# Patient Record
Sex: Male | Born: 1935
Health system: Southern US, Community
[De-identification: ages and names within clinical notes are randomized; demographics above are authoritative.]

## PROBLEM LIST (undated history)

## (undated) DIAGNOSIS — M2550 Pain in unspecified joint: Secondary | ICD-10-CM

## (undated) DIAGNOSIS — N400 Enlarged prostate without lower urinary tract symptoms: Secondary | ICD-10-CM

## (undated) DIAGNOSIS — K219 Gastro-esophageal reflux disease without esophagitis: Secondary | ICD-10-CM

## (undated) DIAGNOSIS — E7800 Pure hypercholesterolemia, unspecified: Secondary | ICD-10-CM

## (undated) DIAGNOSIS — Z860100 Personal history of colon polyps, unspecified: Secondary | ICD-10-CM

## (undated) DIAGNOSIS — N289 Disorder of kidney and ureter, unspecified: Secondary | ICD-10-CM

## (undated) DIAGNOSIS — I10 Essential (primary) hypertension: Secondary | ICD-10-CM

## (undated) DIAGNOSIS — Z87438 Personal history of other diseases of male genital organs: Secondary | ICD-10-CM

## (undated) DIAGNOSIS — I872 Venous insufficiency (chronic) (peripheral): Secondary | ICD-10-CM

## (undated) DIAGNOSIS — R42 Dizziness and giddiness: Secondary | ICD-10-CM

## (undated) DIAGNOSIS — H5440 Blindness, one eye, unspecified eye: Secondary | ICD-10-CM

## (undated) DIAGNOSIS — M1990 Unspecified osteoarthritis, unspecified site: Secondary | ICD-10-CM

## (undated) DIAGNOSIS — R7303 Prediabetes: Secondary | ICD-10-CM

## (undated) DIAGNOSIS — R351 Nocturia: Secondary | ICD-10-CM

## (undated) DIAGNOSIS — F419 Anxiety disorder, unspecified: Secondary | ICD-10-CM

## (undated) HISTORY — DX: Personal history of other diseases of male genital organs: Z87.438

## (undated) HISTORY — DX: Anxiety disorder, unspecified: F41.9

## (undated) HISTORY — DX: Personal history of colonic polyps: Z86.010

## (undated) HISTORY — DX: Unspecified osteoarthritis, unspecified site: M19.90

## (undated) HISTORY — PX: COLONOSCOPY: SHX174

## (undated) HISTORY — DX: Pure hypercholesterolemia, unspecified: E78.00

## (undated) HISTORY — PX: ESOPHAGOGASTRODUODENOSCOPY: SHX1529

## (undated) HISTORY — DX: Dizziness and giddiness: R42

## (undated) HISTORY — DX: Personal history of colon polyps, unspecified: Z86.0100

## (undated) HISTORY — PX: JOINT REPLACEMENT: SHX530

## (undated) HISTORY — DX: Disorder of kidney and ureter, unspecified: N28.9

## (undated) HISTORY — DX: Venous insufficiency (chronic) (peripheral): I87.2

## (undated) HISTORY — DX: Gastro-esophageal reflux disease without esophagitis: K21.9

## (undated) HISTORY — DX: Essential (primary) hypertension: I10

---

## 1958-08-30 HISTORY — PX: OTHER SURGICAL HISTORY: SHX169

## 2001-05-02 ENCOUNTER — Encounter: Admission: RE | Admit: 2001-05-02 | Discharge: 2001-05-02 | Payer: Self-pay | Admitting: *Deleted

## 2001-05-02 ENCOUNTER — Encounter: Payer: Self-pay | Admitting: *Deleted

## 2002-06-30 HISTORY — PX: OTHER SURGICAL HISTORY: SHX169

## 2002-07-16 ENCOUNTER — Ambulatory Visit (HOSPITAL_COMMUNITY): Admission: RE | Admit: 2002-07-16 | Discharge: 2002-07-16 | Payer: Self-pay | Admitting: Orthopaedic Surgery

## 2003-05-09 ENCOUNTER — Ambulatory Visit (HOSPITAL_COMMUNITY): Admission: RE | Admit: 2003-05-09 | Discharge: 2003-05-09 | Payer: Self-pay | Admitting: Pulmonary Disease

## 2003-05-09 ENCOUNTER — Encounter: Payer: Self-pay | Admitting: Pulmonary Disease

## 2004-09-30 ENCOUNTER — Ambulatory Visit: Payer: Self-pay | Admitting: Pulmonary Disease

## 2004-12-03 ENCOUNTER — Ambulatory Visit: Payer: Self-pay | Admitting: Pulmonary Disease

## 2005-04-06 ENCOUNTER — Ambulatory Visit: Payer: Self-pay | Admitting: Pulmonary Disease

## 2005-09-10 ENCOUNTER — Ambulatory Visit: Payer: Self-pay | Admitting: Pulmonary Disease

## 2005-09-17 ENCOUNTER — Ambulatory Visit: Payer: Self-pay | Admitting: Pulmonary Disease

## 2005-12-23 ENCOUNTER — Ambulatory Visit: Payer: Self-pay | Admitting: Pulmonary Disease

## 2006-04-27 ENCOUNTER — Ambulatory Visit: Payer: Self-pay | Admitting: Pulmonary Disease

## 2006-06-14 ENCOUNTER — Ambulatory Visit: Payer: Self-pay | Admitting: Pulmonary Disease

## 2006-06-22 ENCOUNTER — Ambulatory Visit: Payer: Self-pay | Admitting: Pulmonary Disease

## 2006-07-30 HISTORY — PX: OTHER SURGICAL HISTORY: SHX169

## 2006-08-04 ENCOUNTER — Ambulatory Visit (HOSPITAL_COMMUNITY): Admission: RE | Admit: 2006-08-04 | Discharge: 2006-08-06 | Payer: Self-pay | Admitting: Ophthalmology

## 2006-10-12 ENCOUNTER — Ambulatory Visit: Payer: Self-pay | Admitting: Pulmonary Disease

## 2006-10-12 LAB — CONVERTED CEMR LAB
ALT: 25 units/L (ref 0–40)
AST: 21 units/L (ref 0–37)
Albumin: 3.9 g/dL (ref 3.5–5.2)
Alkaline Phosphatase: 49 units/L (ref 39–117)
Basophils Absolute: 0 10*3/uL (ref 0.0–0.1)
Calcium: 9.7 mg/dL (ref 8.4–10.5)
Chloride: 108 meq/L (ref 96–112)
Eosinophils Absolute: 0.1 10*3/uL (ref 0.0–0.6)
Eosinophils Relative: 2.7 % (ref 0.0–5.0)
GFR calc non Af Amer: 46 mL/min
HDL: 68.9 mg/dL (ref >39.0)
MCHC: 34.2 g/dL (ref 30.0–36.0)
MCV: 96 fL (ref 78.0–100.0)
Platelets: 192 10*3/uL (ref 150–400)
RBC: 3.85 M/uL — ABNORMAL LOW (ref 4.22–5.81)
Triglycerides: 44 mg/dL (ref 0–149)
WBC: 3.5 10*3/uL — ABNORMAL LOW (ref 4.5–10.5)

## 2007-01-09 ENCOUNTER — Ambulatory Visit: Payer: Self-pay | Admitting: Pulmonary Disease

## 2007-01-09 ENCOUNTER — Ambulatory Visit (HOSPITAL_COMMUNITY): Admission: RE | Admit: 2007-01-09 | Discharge: 2007-01-09 | Payer: Self-pay | Admitting: Pulmonary Disease

## 2007-02-03 ENCOUNTER — Ambulatory Visit: Payer: Self-pay | Admitting: Internal Medicine

## 2007-02-17 ENCOUNTER — Encounter: Payer: Self-pay | Admitting: Internal Medicine

## 2007-02-17 ENCOUNTER — Ambulatory Visit: Payer: Self-pay | Admitting: Internal Medicine

## 2007-03-15 ENCOUNTER — Ambulatory Visit: Payer: Self-pay | Admitting: Pulmonary Disease

## 2007-04-11 ENCOUNTER — Ambulatory Visit: Payer: Self-pay | Admitting: Pulmonary Disease

## 2007-06-30 ENCOUNTER — Ambulatory Visit: Payer: Self-pay | Admitting: Pulmonary Disease

## 2007-07-05 ENCOUNTER — Ambulatory Visit: Payer: Self-pay | Admitting: Pulmonary Disease

## 2007-07-11 DIAGNOSIS — N259 Disorder resulting from impaired renal tubular function, unspecified: Secondary | ICD-10-CM | POA: Insufficient documentation

## 2007-07-11 DIAGNOSIS — Z87448 Personal history of other diseases of urinary system: Secondary | ICD-10-CM

## 2007-07-11 DIAGNOSIS — I10 Essential (primary) hypertension: Secondary | ICD-10-CM | POA: Insufficient documentation

## 2007-07-11 DIAGNOSIS — K219 Gastro-esophageal reflux disease without esophagitis: Secondary | ICD-10-CM

## 2007-07-11 DIAGNOSIS — I872 Venous insufficiency (chronic) (peripheral): Secondary | ICD-10-CM | POA: Insufficient documentation

## 2007-07-11 DIAGNOSIS — F411 Generalized anxiety disorder: Secondary | ICD-10-CM | POA: Insufficient documentation

## 2007-07-11 DIAGNOSIS — H409 Unspecified glaucoma: Secondary | ICD-10-CM | POA: Insufficient documentation

## 2007-07-11 DIAGNOSIS — M1990 Unspecified osteoarthritis, unspecified site: Secondary | ICD-10-CM | POA: Insufficient documentation

## 2007-07-18 ENCOUNTER — Telehealth (INDEPENDENT_AMBULATORY_CARE_PROVIDER_SITE_OTHER): Payer: Self-pay | Admitting: *Deleted

## 2007-08-28 ENCOUNTER — Telehealth (INDEPENDENT_AMBULATORY_CARE_PROVIDER_SITE_OTHER): Payer: Self-pay | Admitting: *Deleted

## 2007-09-11 ENCOUNTER — Telehealth (INDEPENDENT_AMBULATORY_CARE_PROVIDER_SITE_OTHER): Payer: Self-pay | Admitting: *Deleted

## 2007-11-07 ENCOUNTER — Ambulatory Visit: Payer: Self-pay | Admitting: Pulmonary Disease

## 2008-01-03 ENCOUNTER — Ambulatory Visit: Payer: Self-pay | Admitting: Pulmonary Disease

## 2008-01-03 DIAGNOSIS — R42 Dizziness and giddiness: Secondary | ICD-10-CM

## 2008-01-03 DIAGNOSIS — D126 Benign neoplasm of colon, unspecified: Secondary | ICD-10-CM

## 2008-01-03 DIAGNOSIS — R7309 Other abnormal glucose: Secondary | ICD-10-CM | POA: Insufficient documentation

## 2008-01-06 DIAGNOSIS — E7800 Pure hypercholesterolemia, unspecified: Secondary | ICD-10-CM

## 2008-01-08 LAB — CONVERTED CEMR LAB
Alkaline Phosphatase: 42 units/L (ref 39–117)
Basophils Absolute: 0 10*3/uL (ref 0.0–0.1)
Bilirubin, Direct: 0.1 mg/dL (ref 0.0–0.3)
CO2: 29 meq/L (ref 19–32)
Cholesterol: 242 mg/dL (ref 0–200)
GFR calc Af Amer: 51 mL/min
Glucose, Bld: 96 mg/dL (ref 70–99)
Lymphocytes Relative: 32.8 % (ref 12.0–46.0)
Monocytes Absolute: 0.4 10*3/uL (ref 0.1–1.0)
Monocytes Relative: 10.7 % (ref 3.0–12.0)
PSA: 0.75 ng/mL (ref 0.10–4.00)
Platelets: 176 10*3/uL (ref 150–400)
Potassium: 5.1 meq/L (ref 3.5–5.1)
RDW: 12.8 % (ref 11.5–14.6)
Sodium: 144 meq/L (ref 135–145)
TSH: 3.58 microintl units/mL (ref 0.35–5.50)
Total Bilirubin: 0.9 mg/dL (ref 0.3–1.2)
Total CHOL/HDL Ratio: 3.8
Total Protein: 7 g/dL (ref 6.0–8.3)
Triglycerides: 62 mg/dL (ref 0–149)
VLDL: 12 mg/dL (ref 0–40)

## 2008-01-29 ENCOUNTER — Telehealth (INDEPENDENT_AMBULATORY_CARE_PROVIDER_SITE_OTHER): Payer: Self-pay | Admitting: *Deleted

## 2008-07-02 ENCOUNTER — Ambulatory Visit: Payer: Self-pay | Admitting: Pulmonary Disease

## 2008-07-04 LAB — CONVERTED CEMR LAB
ALT: 26 units/L (ref 0–53)
AST: 27 units/L (ref 0–37)
Albumin: 4 g/dL (ref 3.5–5.2)
BUN: 18 mg/dL (ref 6–23)
CO2: 28 meq/L (ref 19–32)
Calcium: 9.4 mg/dL (ref 8.4–10.5)
Chloride: 110 meq/L (ref 96–112)
Cholesterol: 147 mg/dL (ref 0–200)
Creatinine, Ser: 1.7 mg/dL — ABNORMAL HIGH (ref 0.4–1.5)
Glucose, Bld: 111 mg/dL — ABNORMAL HIGH (ref 70–99)
HDL: 72.4 mg/dL (ref >39.0)
Hgb A1c MFr Bld: 5.9 % (ref 4.6–6.0)
Total CHOL/HDL Ratio: 2
Total Protein: 6.8 g/dL (ref 6.0–8.3)
Triglycerides: 35 mg/dL (ref 0–149)

## 2008-08-13 ENCOUNTER — Telehealth: Payer: Self-pay | Admitting: Pulmonary Disease

## 2008-11-27 ENCOUNTER — Telehealth (INDEPENDENT_AMBULATORY_CARE_PROVIDER_SITE_OTHER): Payer: Self-pay | Admitting: *Deleted

## 2009-02-28 ENCOUNTER — Ambulatory Visit: Payer: Self-pay | Admitting: Pulmonary Disease

## 2009-02-28 DIAGNOSIS — J209 Acute bronchitis, unspecified: Secondary | ICD-10-CM

## 2009-07-04 ENCOUNTER — Ambulatory Visit: Payer: Self-pay | Admitting: Pulmonary Disease

## 2009-07-04 LAB — CONVERTED CEMR LAB
ALT: 23 units/L (ref 0–53)
AST: 25 units/L (ref 0–37)
Albumin: 4.1 g/dL (ref 3.5–5.2)
BUN: 22 mg/dL (ref 6–23)
Basophils Absolute: 0 10*3/uL (ref 0.0–0.1)
Chloride: 111 meq/L (ref 96–112)
Cholesterol: 144 mg/dL (ref 0–200)
Eosinophils Relative: 3.7 % (ref 0.0–5.0)
GFR calc non Af Amer: 47.65 mL/min (ref >60)
Glucose, Bld: 109 mg/dL — ABNORMAL HIGH (ref 70–99)
HCT: 34.1 % — ABNORMAL LOW (ref 39.0–52.0)
Hemoglobin: 11.6 g/dL — ABNORMAL LOW (ref 13.0–17.0)
Hgb A1c MFr Bld: 6 % (ref 4.6–6.5)
Lymphs Abs: 1.2 10*3/uL (ref 0.7–4.0)
MCV: 98.8 fL (ref 78.0–100.0)
Monocytes Absolute: 0.3 10*3/uL (ref 0.1–1.0)
Monocytes Relative: 10.7 % (ref 3.0–12.0)
Neutro Abs: 1.6 10*3/uL (ref 1.4–7.7)
Platelets: 167 10*3/uL (ref 150.0–400.0)
Potassium: 4.9 meq/L (ref 3.5–5.1)
RDW: 12.7 % (ref 11.5–14.6)
Sodium: 145 meq/L (ref 135–145)
TSH: 3.38 microintl units/mL (ref 0.35–5.50)
Total Bilirubin: 0.8 mg/dL (ref 0.3–1.2)

## 2009-10-27 ENCOUNTER — Telehealth (INDEPENDENT_AMBULATORY_CARE_PROVIDER_SITE_OTHER): Payer: Self-pay | Admitting: *Deleted

## 2009-10-28 ENCOUNTER — Telehealth (INDEPENDENT_AMBULATORY_CARE_PROVIDER_SITE_OTHER): Payer: Self-pay | Admitting: *Deleted

## 2009-10-31 ENCOUNTER — Encounter: Payer: Self-pay | Admitting: Adult Health

## 2009-10-31 ENCOUNTER — Telehealth (INDEPENDENT_AMBULATORY_CARE_PROVIDER_SITE_OTHER): Payer: Self-pay | Admitting: *Deleted

## 2009-10-31 ENCOUNTER — Ambulatory Visit: Payer: Self-pay | Admitting: Pulmonary Disease

## 2009-10-31 DIAGNOSIS — K5289 Other specified noninfective gastroenteritis and colitis: Secondary | ICD-10-CM | POA: Insufficient documentation

## 2009-11-02 LAB — CONVERTED CEMR LAB
ALT: 36 units/L (ref 0–53)
AST: 21 units/L (ref 0–37)
Alkaline Phosphatase: 55 units/L (ref 39–117)
Basophils Absolute: 0.1 10*3/uL (ref 0.0–0.1)
Bilirubin, Direct: 0.1 mg/dL (ref 0.0–0.3)
CO2: 23 meq/L (ref 19–32)
Chloride: 111 meq/L (ref 96–112)
Creatinine, Ser: 3.1 mg/dL — ABNORMAL HIGH (ref 0.4–1.5)
Eosinophils Absolute: 0.1 10*3/uL (ref 0.0–0.7)
Hemoglobin: 13 g/dL (ref 13.0–17.0)
Lipase: 51 units/L (ref 11.0–59.0)
Lymphocytes Relative: 24.8 % (ref 12.0–46.0)
MCHC: 34.1 g/dL (ref 30.0–36.0)
Monocytes Relative: 6.6 % (ref 3.0–12.0)
Neutrophils Relative %: 65 % (ref 43.0–77.0)
Platelets: 227 10*3/uL (ref 150.0–400.0)
Potassium: 5.4 meq/L — ABNORMAL HIGH (ref 3.5–5.1)
RDW: 12.5 % (ref 11.5–14.6)
Sodium: 140 meq/L (ref 135–145)
Specific Gravity, Urine: >=1.03 (ref 1.000–1.030)
Total Bilirubin: 0.4 mg/dL (ref 0.3–1.2)
Total Protein: 8 g/dL (ref 6.0–8.3)
Urine Glucose: NEGATIVE mg/dL
Urobilinogen, UA: 0.2 (ref 0.0–1.0)
pH: 5 (ref 5.0–8.0)

## 2009-11-03 ENCOUNTER — Ambulatory Visit: Payer: Self-pay | Admitting: Pulmonary Disease

## 2009-11-03 LAB — CONVERTED CEMR LAB
CO2: 23 meq/L (ref 19–32)
Calcium: 8.9 mg/dL (ref 8.4–10.5)
Creatinine, Ser: 2.3 mg/dL — ABNORMAL HIGH (ref 0.4–1.5)
GFR calc non Af Amer: 35.88 mL/min (ref >60)
Glucose, Bld: 110 mg/dL — ABNORMAL HIGH (ref 70–99)

## 2010-01-05 ENCOUNTER — Ambulatory Visit: Payer: Self-pay | Admitting: Pulmonary Disease

## 2010-01-11 LAB — CONVERTED CEMR LAB
BUN: 21 mg/dL (ref 6–23)
Basophils Relative: 0.7 % (ref 0.0–3.0)
Creatinine, Ser: 1.7 mg/dL — ABNORMAL HIGH (ref 0.4–1.5)
Eosinophils Relative: 3.7 % (ref 0.0–5.0)
GFR calc non Af Amer: 50.83 mL/min (ref >60)
HCT: 33.6 % — ABNORMAL LOW (ref 39.0–52.0)
Hgb A1c MFr Bld: 5.7 % (ref 4.6–6.5)
LDL Cholesterol: 66 mg/dL (ref 0–99)
Lymphs Abs: 1 10*3/uL (ref 0.7–4.0)
MCV: 96.6 fL (ref 78.0–100.0)
Monocytes Absolute: 0.4 10*3/uL (ref 0.1–1.0)
Neutro Abs: 1.6 10*3/uL (ref 1.4–7.7)
PSA: 0.69 ng/mL (ref 0.10–4.00)
Potassium: 5.2 meq/L — ABNORMAL HIGH (ref 3.5–5.1)
RBC: 3.48 M/uL — ABNORMAL LOW (ref 4.22–5.81)
Total Bilirubin: 0.5 mg/dL (ref 0.3–1.2)
Total CHOL/HDL Ratio: 2
Triglycerides: 28 mg/dL (ref 0.0–149.0)
WBC: 3 10*3/uL — ABNORMAL LOW (ref 4.5–10.5)

## 2010-03-23 ENCOUNTER — Encounter: Payer: Self-pay | Admitting: Pulmonary Disease

## 2010-04-28 ENCOUNTER — Telehealth (INDEPENDENT_AMBULATORY_CARE_PROVIDER_SITE_OTHER): Payer: Self-pay | Admitting: *Deleted

## 2010-06-08 ENCOUNTER — Telehealth: Payer: Self-pay | Admitting: Pulmonary Disease

## 2010-06-26 ENCOUNTER — Ambulatory Visit: Payer: Self-pay | Admitting: Pulmonary Disease

## 2010-07-08 ENCOUNTER — Ambulatory Visit: Payer: Self-pay | Admitting: Pulmonary Disease

## 2010-07-09 LAB — CONVERTED CEMR LAB
CO2: 28 meq/L (ref 19–32)
Calcium: 9.8 mg/dL (ref 8.4–10.5)
Creatinine, Ser: 1.7 mg/dL — ABNORMAL HIGH (ref 0.4–1.5)

## 2010-07-13 ENCOUNTER — Telehealth: Payer: Self-pay | Admitting: Pulmonary Disease

## 2010-07-13 DIAGNOSIS — M545 Low back pain, unspecified: Secondary | ICD-10-CM | POA: Insufficient documentation

## 2010-07-17 ENCOUNTER — Telehealth: Payer: Self-pay | Admitting: Pulmonary Disease

## 2010-09-19 ENCOUNTER — Encounter: Payer: Self-pay | Admitting: Pulmonary Disease

## 2010-10-01 NOTE — Progress Notes (Signed)
Summary: diarrhea/ vomiting  Phone Note Call from Patient   Caller: Patient Call For: nadel Summary of Call: pt still have diarrhea and vomiting is there something he can have called to pharmacy [facility] [address] Initial call taken by: Gustavus Bryant,  October 31, 2009 10:13 AM  Follow-up for Phone Call        called, spokw with pt.  Pt c/o diarhea since 2/27.  states he has tried bland diet as instructed on previous phone note.  states he had some vomiting yesterday morning.  has taken Pepto but hasn't helped.  Pt ok to come in today to see tp-ov scheduled-pt aware.  Raymondo Band RN  October 31, 2009 12:26 PM

## 2010-10-01 NOTE — Progress Notes (Signed)
Summary: ?about meds  Phone Note Call from Patient   Summary of Call: pt wanted to know if it is ok for him to take procera avh----he wanted to make sure that this would not interfere with any of his other meds.  please advise .  thanks'Leigh Julien Girt CMA  June 08, 2010 12:06 PM   Follow-up for Phone Call        per SN-----ok to take the procera if he wants to.   i have the bottle of his meds on my desk.  thanks [place]  June 08, 2010 2:55 PM    called and spoke with pt and he is aware per SN recs---he will stop by and pick up these meds. Elita Boone CMA  June 08, 2010 3:42 PM

## 2010-10-01 NOTE — Progress Notes (Signed)
Summary: BP  Phone Note Call from Patient Call back at Home Phone [phone]   Caller: Patient Call For: nadel Reason for Call: Talk to Nurse Summary of Call: pt concerned about BP (130/74).  Says that is high.  Please respond. Initial call taken by: Zigmund Gottron,  October 27, 2009 3:30 PM  Follow-up for Phone Call        pt aware that this is a good b/p per sn --keep up the good work, pt thought this was to high--pt very excited to know this is a good b/p Follow-up by: [place],  October 27, 2009 3:49 PM

## 2010-10-01 NOTE — Progress Notes (Signed)
Summary: appt request   [id number]  Phone Note Call from Patient Call back at Home Phone [phone]   Caller: Patient Call For: nadel Summary of Call: pt says dr Jeannine Kitten nurse was to make him an appt w/ GI dr for colonoscopy. pt walked in- just left our bldg and can be reached at home later today Initial call taken by: Cooper Render, CNA,  April 28, 2010 12:56 PM  Follow-up for Phone Call        [facility].  Has pt ever seen a GI dr before?  Jinny Blossom Reynolds LPN  August 30, 624THL 2:10 PM   pt returned our call.  pt states when he last saw SN in May 2011, he was told by SN that it was "time for him to have a colonoscopy."  Pt states he cannot remember where he went previously for a colonoscopy or who he saw.  Pt would like to go ahead and get this set up/referred to GI MD.  Billey Gosling forward message to SN to address.  Jinny Blossom Reynolds LPN  August 30, 624THL 3:44 PM   Additional Follow-up for Phone Call Additional follow up Details #1::        per SN---last colon was in 01/2007 and told to f/u in 5 yrs.  so he does not need follow up with GI until 01/2012.  thanks [place]  April 28, 2010 4:25 PM     Additional Follow-up for Phone Call Additional follow up Details #2::    called and spoke with pt.  pt aware he is not due for a colonoscopy until  june 2013.  nothing further needed.  Jinny Blossom Reynolds LPN  August 30, 624THL 5:11 PM
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
## 2010-10-01 NOTE — Progress Notes (Signed)
Summary: gi upset  Phone Note Call from Patient Call back at Home Phone [phone]   Caller: Patient Call For: nadel Reason for Call: Talk to Nurse Summary of Call: pt has had a stomach virus 3-4 days, is there any way you can test bowels.  Has had vomiting and diarrhea. Initial call taken by: Zigmund Gottron,  October 28, 2009 2:45 PM  Follow-up for Phone Call        called and spoke with pt.  pt states he got a stomach virus 10/18/2009 with n/v/d.  pt states symptoms resolved after a day.  However, pt states on Sunday 10/26/2009 he starting having diarrhea again.  Approx 3 to 4 loose stools per day.   no n/v.  encouraged pt to eat a very bland diet of rice, bread, crackers, clear liquids, bland soups, along with sips of sprite and/or ginger ale.  i encouraged pt to eat and drink little bits a time so his stomach doesn't get upset.  pt agreed to try this and will call back if symptoms do not improve or worsen.  Matthew Folks LPN  March  1, 624THL [id number] PM

## 2010-10-01 NOTE — Progress Notes (Signed)
Summary: WANTS MRI WED  Phone Note Call from Patient Call back at [facility] Phone [phone]   Caller: Patient Call For: [name] Bertone Summary of Call: PT WANTS TO KNOW IF HE CAN HAVE AN MRI THIS WED MORNING. CALL HOME # ABOVE OR [phone] Initial call taken by: Cooper Render, CNA,  July 13, 2010 9:59 AM  Follow-up for Phone Call        Pt states when he saw Dr. Lenna Gilford on 07-08-10 they discussed the pt getting an MRI of his back and hip. Pt wants to proceed with this and request it be scheduled for this WEd. 07-15-10. Please advise.[place] Bing CMA  July 13, 2010 11:05 AM   Additional Follow-up for Phone Call Additional follow up Details #1::        per SN----ok for MRI  l/s spine for low back pain with radiating into both hips.  order has been placed in computer.  pt is aware via message on machine  and pt is requesting that this be done on wednesday morning.    New Problems: BACK PAIN, LUMBAR (ICD-724.2)   New Problems: BACK PAIN, LUMBAR (ICD-724.2)
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
## 2010-10-01 NOTE — Letter (Signed)
Summary: [facility]  [facility]   Imported By: Rise Patience 03/31/2010 12:21:41  _____________________________________________________________________  External Attachment:    Type:   Image     Comment:   External Document
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
## 2010-10-01 NOTE — Assessment & Plan Note (Signed)
Summary: flu shot/ mbw   Nurse Visit   Allergies: 1)  ! Lisinopril (Lisinopril) 2)  ! Diovan (Valsartan)  Orders Added: 1)  Flu Vaccine 60yrs + MEDICARE PATIENTS [Q2039] 2)  Administration Flu vaccine - MCR [id number]  Flu Vaccine Consent Questions     Do you have a history of severe allergic reactions to this vaccine? no    Any prior history of allergic reactions to egg and/or gelatin? no    Do you have a sensitivity to the preservative Thimersol? no    Do you have a past history of Guillan-Barre Syndrome? no    Do you currently have an acute febrile illness? no    Have you ever had a severe reaction to latex? no    Vaccine information given and explained to patient? yes    Are you currently pregnant? no    Lot Number:[id number]   Exp Date:02/27/2011   Site Given  Left Deltoid IM Marcy Siren (AAMA)  June 26, 2010 10:50 AM

## 2010-10-16 ENCOUNTER — Telehealth (INDEPENDENT_AMBULATORY_CARE_PROVIDER_SITE_OTHER): Payer: Self-pay | Admitting: *Deleted

## 2010-10-26 ENCOUNTER — Telehealth: Payer: Self-pay | Admitting: Pulmonary Disease

## 2010-10-27 NOTE — Progress Notes (Signed)
Summary: friend's new consult/ nadel to be pcp - pt called back  Phone Note Call from Patient Call back at Home Phone [phone]   Caller: Patient Call For: nadel Summary of Call: Patient came in and wants to know if Dr Lenna Gilford would be willing to accept in lady friend as a new patient. Her pcp is retiring and she is looking a  new one. her name is Public librarian dob [birth year]. He can be reahced at [phone] Initial call taken by: Ozella Rocks,  October 16, 2010 10:46 AM  Follow-up for Phone Call        SN ---please advise if you are willing to take on this new pt.  thanks Burnett  October 16, 2010 12:28 PM   Additional Follow-up for Phone Call Additional follow up Details #1::        ok for his friend to call and make appt  Elita Boone [facility]  October 16, 2010 5:09 PM  pt (mr. Clift, came in the office requesting to speak to the nurse re: his lady friend. i told him per leigh's note that dr Lenna Gilford has agreed to see her as a new pt. pls call him when you can to confirm as he had "a couple of questions". he will get her (new pt) to call and make this appt in the meantime. Cooper Render, CNA  October 19, 2010 8:54 AM    Additional Follow-up for Phone Call Additional follow up Details #2::    friend's name is christine Zenia Resides and she will call this afternoon and ask for leigh to get this appt set up Follow-up by: [place],  October 19, 2010 10:01 AM

## 2010-11-05 NOTE — Progress Notes (Signed)
Summary: prescription   Phone Note Call from Patient Call back at Home Phone [phone]   Caller: Patient Call For: Lenna Gilford Summary of Call: patient phoned and wanted to know if Dr Lenna Gilford would call a prescription in for Propoxypher APAP 100 g for his arthritis, He uses [facility] on [address]. Patient can be reached at [phone] or [phone] Initial call taken by: Ozella Rocks,  October 26, 2010 10:22 AM  Follow-up for Phone Call        Called, spoke wth pt.  States he is having stiffness and pain in left knee and believes it is arthritis again.  States he "couldn't hardly bend it" this am.  Used a heating pad with some relief.  States SN has given him something in the past for this but does not have the name with him.  Asked does he have vicodin to use as needed for pain but he was unsure bc he was not at home.  Requesting something to be sent in [place] - Verified:  ! LISINOPRIL (LISINOPRIL) ! DIOVAN (VALSARTAN)  Dr. Lenna Gilford, pls advise.  Thanks!  Follow-up by: Raymondo Band RN,  October 26, 2010 2:20 PM  Additional Follow-up for Phone Call Additional follow up Details #1::        per SN----this is no longer on the market---he will need to call refill of the med he is talking about into the pharmacy so they can call us for refills. thanks [place]  October 26, 2010 3:41 PM     Additional Follow-up for Phone Call Additional follow up Details #2::    called and spoke with pt and he is aware now that this med is no longer made---he is requesting something to help with arthritis pain in his knee---pt stated that he is almost unable to walk on that leg.  please advise.  thanks [place]  October 26, 2010 4:19 PM   per SN---ok for pt to have diclofenac 75mg    #30   1 by mouth two times a day with 5 refills.---he will need to see  ortho for the pain in his knee--he stated that he see's Dr. Lorin Mercy for his knees and Dr. Lorin Mercy wants him to  have knee replacements but pt is not up to this right now---he has to wait until he is able to do the surgery---pt stated that he called here and to Dr. Lorin Mercy office to see who could get him the pain meds first---he is going to call to Dr. Lorin Mercy and see if he can get an appt to have his knee looked at this week due to the pain Elita Boone CMA  October 26, 2010 4:51 PM   New/Updated Medications: DICLOFENAC SODIUM 75 MG TBEC (DICLOFENAC SODIUM) take one tablet by mouth two times a day  Prescriptions: DICLOFENAC SODIUM 75 MG TBEC (DICLOFENAC SODIUM) take one tablet by mouth two times a day  #30 x 5   Entered by:   Elita Boone CMA   Authorized by:   Noralee Space MD   Signed by:   Elita Boone CMA on 10/26/2010   Method used:   Electronically to        [facility] (retail)       [address]       [place], [place]  [id number]       Ph: [phone]       Fax: [phone]   RxID:  [id number]  

## 2011-01-11 ENCOUNTER — Encounter: Payer: Self-pay | Admitting: Pulmonary Disease

## 2011-01-11 ENCOUNTER — Ambulatory Visit (INDEPENDENT_AMBULATORY_CARE_PROVIDER_SITE_OTHER): Payer: Self-pay | Admitting: Pulmonary Disease

## 2011-01-11 ENCOUNTER — Other Ambulatory Visit (INDEPENDENT_AMBULATORY_CARE_PROVIDER_SITE_OTHER): Payer: Medicare Other

## 2011-01-11 DIAGNOSIS — E291 Testicular hypofunction: Secondary | ICD-10-CM

## 2011-01-11 DIAGNOSIS — K219 Gastro-esophageal reflux disease without esophagitis: Secondary | ICD-10-CM

## 2011-01-11 DIAGNOSIS — N259 Disorder resulting from impaired renal tubular function, unspecified: Secondary | ICD-10-CM

## 2011-01-11 DIAGNOSIS — Z125 Encounter for screening for malignant neoplasm of prostate: Secondary | ICD-10-CM

## 2011-01-11 DIAGNOSIS — E7800 Pure hypercholesterolemia, unspecified: Secondary | ICD-10-CM

## 2011-01-11 DIAGNOSIS — F411 Generalized anxiety disorder: Secondary | ICD-10-CM

## 2011-01-11 DIAGNOSIS — I10 Essential (primary) hypertension: Secondary | ICD-10-CM

## 2011-01-11 DIAGNOSIS — D126 Benign neoplasm of colon, unspecified: Secondary | ICD-10-CM

## 2011-01-11 DIAGNOSIS — M1990 Unspecified osteoarthritis, unspecified site: Secondary | ICD-10-CM

## 2011-01-11 DIAGNOSIS — M545 Low back pain: Secondary | ICD-10-CM

## 2011-01-11 DIAGNOSIS — R7309 Other abnormal glucose: Secondary | ICD-10-CM

## 2011-01-11 LAB — HEPATIC FUNCTION PANEL
ALT: 24 U/L (ref 0–53)
AST: 26 U/L (ref 0–37)
Alkaline Phosphatase: 44 U/L (ref 39–117)
Bilirubin, Direct: 0 mg/dL (ref 0.0–0.3)
Total Bilirubin: 0.5 mg/dL (ref 0.3–1.2)
Total Protein: 6.5 g/dL (ref 6.0–8.3)

## 2011-01-11 LAB — PSA: PSA: 1.16 ng/mL (ref 0.10–4.00)

## 2011-01-11 LAB — BASIC METABOLIC PANEL
CO2: 26 mEq/L (ref 19–32)
Chloride: 105 mEq/L (ref 96–112)
Potassium: 5.2 mEq/L — ABNORMAL HIGH (ref 3.5–5.1)
Sodium: 137 mEq/L (ref 135–145)

## 2011-01-11 LAB — LIPID PANEL
Total CHOL/HDL Ratio: 2
Triglycerides: 39 mg/dL (ref 0.0–149.0)

## 2011-01-11 LAB — CBC WITH DIFFERENTIAL/PLATELET
Basophils Absolute: 0 10*3/uL (ref 0.0–0.1)
Eosinophils Absolute: 0.1 10*3/uL (ref 0.0–0.7)
Lymphocytes Relative: 28.7 % (ref 12.0–46.0)
MCHC: 34.3 g/dL (ref 30.0–36.0)
MCV: 97 fl (ref 78.0–100.0)
Monocytes Absolute: 0.4 10*3/uL (ref 0.1–1.0)
Neutro Abs: 1.9 10*3/uL (ref 1.4–7.7)
Neutrophils Relative %: 54.7 % (ref 43.0–77.0)
RDW: 13.5 % (ref 11.5–14.6)

## 2011-01-11 NOTE — Patient Instructions (Signed)
Today we updated your med list in EPIC.[name]  Today we did your follow up fasting blood work...    Please call the PHONE TREE in a few days for your results...    Dial [id number] & when prompted enter your patient number followed by the # symbol...    Your patient number is:  [id number]  Good luck w/ the knee therapy & poss upcoming surg...  Call for any questions...  Let's plan a follow up appt in  6 months.Marland KitchenMarland Kitchen
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
## 2011-01-15 NOTE — Op Note (Signed)
[name], Greg [name]                            ACCOUNT NO.:  [id number]   MEDICAL RECORD NO.:  [id number]                   PATIENT TYPE:  OIB   LOCATION:  NA                                   FACILITY:  [facility]   PHYSICIAN:  Mark C. Lorin Mercy, M.D.                 DATE OF BIRTH:  31-Mar-1933   DATE OF PROCEDURE:  07/16/2002  DATE OF DISCHARGE:                                 OPERATIVE REPORT   PREOPERATIVE DIAGNOSIS:  Right knee medial compartment osteoarthritis.   POSTOPERATIVE DIAGNOSIS:  Right knee medial compartment osteoarthritis.   PROCEDURE:  Right knee medial unicompartmental arthroplasty, partial  patellar resection.   SURGEON:  Mark C. Lorin Mercy, M.D.   ASSISTANT:  Sanda Linger, P.A.-C.   ANESTHESIA:  General plus femoral block and Marcaine pain pump.   TOURNIQUET TIME:  1 hour 15 minutes.   PROCEDURE:  After induction of general anesthesia with orotracheal  intubation with the patient in the supine position, a leg holder was applied  over the tourniquet and the foot of the bed was allowed to drop down with a  seated surgeon position.  The leg was prepped with Duraprep and stockinette.  Sheets and drapes were applied.  The leg was wrapped in an Esmarch and the  tourniquet was inflated.  A 3 1/2 to 4 inch  incision was made medial  parapatellar.  The reciprocating saw was used to remove the medial 1/4 of  the patella performing a partial patellectomy.  Spurs were trimmed off of  the femur and off the medial tibial plateau after a retractor was placed.  The meniscus was resected.  The reciprocating saw was used to make the  medial straight cut and actually 4 mm was removed followed by additional 2  mm.  This also still was slightly tight and an additional 2 mm removing a  thin wafer of bone.  An 8 mm spacer fit appropriately.  Checking the  external alignment guide, appropriate rotation for the femur was chosen and  there was balancing, slightly balancing it slightly more  toward the midline  of the knee near the notch rather than near the medial collateral ligament  region.  The external alignment rod was satisfactory, a small hole was  drilled followed by an oscillating saw and a large drill bit.  The bone was  burred slightly off the posterior aspect and the posterior cut was made.  There were some small spurs posteriorly and they were removed with the  curved osteotome.  Trials were inserted, there was excellent stability in  both flexion and extension.  The keel cut was made on the tibia and a medium  tibia and a small femur were appropriate sizes.  A keel hole was made for  the tibia, the cement was vacuum mixed, the tibia was cemented first,  followed by the femur.  The patient tolerated the procedure  well.  After  cement was hardened with two tone blade stuck inbetween the femur and tibia  for compression, the cemented hardened 15 minutes.  A pain pump was placed  through a superolateral spot.  The retinaculum was closed with 0 Vicryl, 2-0  Vicryl in the subcutaneous tissue, Marcaine was infiltrated in the skin,  skin stapled closure, and postop dressing.  The patient tolerated the  procedure well and was transferred to the recovery room and then discharged  home with instructions to remove his pain pump at 48 hours.                                               Mark C. Lorin Mercy, M.D.   MCY/MEDQ  D:  07/16/2002  T:  07/16/2002  Job:  [id number]

## 2011-01-15 NOTE — H&P (Signed)
NAMEAMONI, TUREAUD                ACCOUNT NO.:  [id number]   MEDICAL RECORD NO.:  [id number]          PATIENT TYPE:  OIB   LOCATION:  5707                         FACILITY:  [facility]   PHYSICIAN:  Chrystie Nose. Zigmund Daniel, M.D. DATE OF BIRTH:  February 24, 1933   DATE OF ADMISSION:  08/04/2006  DATE OF DISCHARGE:                              HISTORY & PHYSICAL   CHIEF COMPLAINT:  Severe loss of vision, right eye.   HISTORY OF PRESENT ILLNESS:  This 77 year old black male presented with  a sudden loss of vision, right eye, and purulent discharge.   ALLERGIES:  BETAGAN.   MEDICATIONS:  1. Protegra.  2. Bayer aspirin.  3. Flomax.  4. Labetalol.  5. Meclizine.  6. Viagra.   PAST MEDICAL HISTORY:  Cataract extraction, right eye with  trabeculectomy in 1995 by Dr. Gerarda Fraction. [place].  Also, right total knee.  Denies hepatitis, denies blood transfusions.   FAMILY HISTORY:  Negative for MI, hypertension, diabetes, and stroke.   REVIEW OF SYSTEMS:  Negative except for the pain in right eye.   PHYSICAL EXAMINATION:  VITAL SIGNS:  Pulse 64, respirations 16, blood  pressure 166/84, 5 feet 9 inches, 180 pounds.  GENERAL:  Well-developed, well-maintained, black male in no acute  distress.  HEENT:  His eye exam showed a closed eyelid with purulent discharge on  the right side.  He had light perception vision with purulent material  in the anterior chamber and a white cornea.  The remainder of his HEENT  exam was negative.  NECK:  Supple and normal.  CHEST:  Clear to auscultation.  CARDIO:  Regular rate and rhythm.  ABDOMEN:  Soft nontender.  GU:  Deferred.  EXTREMITIES:  No ankle edema.  SKIN:  Dry.   IMPRESSION:  Endophthalmitis, right eye, status post cataract extraction  with glaucoma bleb in 1995.   PLAN:  Admit to [facility] for culture and treatment of  endophthalmitis.      Chrystie Nose. Zigmund Daniel, M.D.  Electronically Signed     JDM/MEDQ  D:  08/06/2006  T:  08/06/2006  Job:   [id number]

## 2011-01-15 NOTE — Op Note (Signed)
Greg [name], Greg [name]                ACCOUNT NO.:  [id number]   MEDICAL RECORD NO.:  [id number]          PATIENT TYPE:  OIB   LOCATION:  5707                         FACILITY:  [facility]   PHYSICIAN:  Chrystie Nose. Zigmund Daniel, M.D. DATE OF BIRTH:  July 11, 1933   DATE OF PROCEDURE:  08/04/2006  DATE OF DISCHARGE:                               OPERATIVE REPORT   ADMISSION DIAGNOSIS:  Endophthalmitis, right eye.  Glaucoma bleb  infection, right eye.   PROCEDURES:  Culture of right eye lids, culture of right aqueous fluid,  culture of right vitreous fluid.  Pars plana vitrectomy, iridectomy,  capsulectomy, 25 gauge system, injection of vancomycin 1 mg into the  vitreous, ceftazidime 2.25 mg into the vitreous, Decadron 400 mcg into  the vitreous, injection of vancomycin 50 mg subconjunctival, injection  of ceftazidime 100 mg subconjunctival, injection of Decadron 10 mg  subconjunctival right eye.   SURGEON:  Tempie Hoist, M.D.   ASSISTANT:  Thurman Coyer. Lundquist, P.A.   ANESTHESIA:  General.   DESCRIPTION OF PROCEDURE:  Before anesthesia was induced the cultures  were taken and plated the bedside for the right eye.  After endotracheal  anesthesia the prep and drape was performed.  A culture sample was taken  from the aqueous and plated on culture plates in the operating room. A  culture was then taken from the vitreous cavity with suction through the  25-gauge cutter.  A 25 gauge trocar was placed at 8, 10 and 2 o'clock  with infusion at 8 o'clock.  Purulent material came out of the trocars.  The vitreous sample was taken and placed on culture media.  The pars  plana vitrectomy was begun in the pupillary axis.  There was no view.  There was a dense white cornea and white material coating the back of  the cornea and the surface of the iris.  The entire anterior chamber was  white.  A 25-gauge trocar was placed at 2 o'clock into the anterior  chamber.  The 25-gauge cutter was placed in the anterior  chamber and all  the purulent material and fibrin was removed from the peripheral iris  and the central pupil.  Iridectomies were performed at 8 and 10 o'clock.  Pars plana vitrectomy again was performed behind the pseudophakos.  Although the cutter could be seen with the light pipe, the view was very  dim and white material was engulfing the vitreous cavity.  The vitreous  body was debulked with the cutter but the cutter tip was in view at all  times.  The instruments were removed from the eye and the intravitreal  antibiotics above were injected into the vitreous cavity.  A 10-0 nylon  was used to close the trocar opening at 10 o'clock and the cornea and at  2 o'clock in the cornea.  All wounds were found to be tight.  The  subconjunctival antibiotics above were delivered to the subconjunctival  space.  Polymyxin and gentamicin were irrigated into Tenon's space.  Atropine was applied.  Decadron 10 mg was checked into the lower  subconjunctival space.  TobraDex ophthalmic  ointment, a patch and shield  were placed.  Closing pressure was 10 with a Pharmacologist.  Complications none.  Duration 1 hour.  The patient was awakened, taken  to recovery in satisfactory condition.     Chrystie Nose. Zigmund Daniel, M.D.  Electronically Signed    JDM/MEDQ  D:  08/04/2006  T:  08/05/2006  Job:  [id number]

## 2011-01-15 NOTE — Discharge Summary (Signed)
NAMEJOBIE, RAMANI                ACCOUNT NO.:  [id number]   MEDICAL RECORD NO.:  [id number]          PATIENT TYPE:  OIB   LOCATION:  5707                         FACILITY:  [facility]   PHYSICIAN:  Chrystie Nose. Zigmund Daniel, M.D. DATE OF BIRTH:  [birth year]   DATE OF ADMISSION:  08/06/2006  DATE OF DISCHARGE:                               DISCHARGE SUMMARY   CHIEF COMPLAINT:  Mr. Joshlin suffered endophthalmitis presentation in  __________  in the office on August 04, 2006.  He had purulent  discharge, severe loss of vision and pain in the right eye.  He was sent  immediately upon diagnosis to [facility] for appropriate surgical treatment.  His history and physical has been dictated and the details of his past  medical history, etcetera are in that document.  His medications on  presentation were Protegra, Bayer aspirin, Flomax, labetalol, meclizine,  and Viagra.   HOSPITAL COURSE:  He was brought through outpatient as an emergency.  The surgical schedule was interrupted when he was ready and he was taken  as an emergency to the operating room where cultures and vitrectomy with  antibiotic injection were performed in the operating room.  He tolerated  the procedure well.  Postoperatively, the first day when his patch came  off he had copious drainage and  discharge from the eye.  This was wiped  away and the eye showed a white cornea with anterior chamber full of  purulent material and no view of the posterior segment.  However, he  stated his pain was decreased and he still had light perception vision.  It should be noted that in the operating room vancomycin 1 mg was  injected into the vitreous as well as 2.2 mg of ceftazidime and 400 mcg  of Decadron.  He was started on hourly eye drops and intravenous  vancomycin.  This was because his condition was related to a glaucoma  bleb and not a routine postoperative cataract case.  On 12-hour re-visit  by the surgeon the drainage was less and he maintained  light perception,  vision, and intraocular pressure of 5.  He continued on vancomycin over  the second night and hourly eye drops.  Cultures which had been plated  in the operating room were found to contain Gram-positive cocci in  pairs, chains, and clumps.  These Gram-positive cocci are thought to be  strep or alpha hemolytic strep.  The eyelid culture showed no growth.  The aqueous cultures show alpha hemolytic strep.  The vitreous culture  shows alpha hemolytic strep.  The cultures are still pending.  Because  of these preliminary culture results it was felt that subconjunctival  injections would be appropriate at the bedside.  So today, August 06, 2006, at 9:20 a.m., he received vancomycin 25 mg subconjunctivally above  the filtering bleb then ceftazidime 100 mg subconjunctivally in the  lower temporal quadrant of the lid and Decadron 5 mg subconjunctival in  the lower nasal quadrant of the lid.  There was pain associated with  this bedside procedure and he then received Tylenol No. 3 two pills  p.o.  He is discharged today with light perception vision and a pressure of 10  and no view of the posterior pole.   His medications are:  1. Acular every 2 hours.  2. Pred forte every 2 hours to the right eye.  3. Tobrex 4 times a day to the right eye.  4. Zymar 4 times a day to the right eye.  5. Atropine 2 times daily.   FOLLOWUP:  He will be followed up in my office in 24 hours, Sunday  morning at 9 a.m.   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  He is also  discharged on his normal home medications.      Chrystie Nose. Zigmund Daniel, M.D.  Electronically Signed     JDM/MEDQ  D:  08/06/2006  T:  08/07/2006  Job:  [id number]

## 2011-01-18 ENCOUNTER — Encounter: Payer: Self-pay | Admitting: Pulmonary Disease

## 2011-02-01 ENCOUNTER — Other Ambulatory Visit: Payer: Self-pay | Admitting: Pulmonary Disease

## 2011-05-10 ENCOUNTER — Encounter (INDEPENDENT_AMBULATORY_CARE_PROVIDER_SITE_OTHER): Payer: Medicare Other | Admitting: Ophthalmology

## 2011-05-10 DIAGNOSIS — H33009 Unspecified retinal detachment with retinal break, unspecified eye: Secondary | ICD-10-CM

## 2011-05-10 DIAGNOSIS — H35039 Hypertensive retinopathy, unspecified eye: Secondary | ICD-10-CM

## 2011-05-11 ENCOUNTER — Encounter (INDEPENDENT_AMBULATORY_CARE_PROVIDER_SITE_OTHER): Payer: Medicare Other | Admitting: Ophthalmology

## 2011-07-20 ENCOUNTER — Encounter: Payer: Self-pay | Admitting: Pulmonary Disease

## 2011-07-20 ENCOUNTER — Ambulatory Visit (INDEPENDENT_AMBULATORY_CARE_PROVIDER_SITE_OTHER): Payer: Medicare Other | Admitting: Pulmonary Disease

## 2011-07-20 DIAGNOSIS — E291 Testicular hypofunction: Secondary | ICD-10-CM

## 2011-07-20 DIAGNOSIS — N401 Enlarged prostate with lower urinary tract symptoms: Secondary | ICD-10-CM

## 2011-07-20 DIAGNOSIS — Z23 Encounter for immunization: Secondary | ICD-10-CM

## 2011-07-20 DIAGNOSIS — I10 Essential (primary) hypertension: Secondary | ICD-10-CM

## 2011-07-20 DIAGNOSIS — N139 Obstructive and reflux uropathy, unspecified: Secondary | ICD-10-CM

## 2011-07-20 DIAGNOSIS — M1990 Unspecified osteoarthritis, unspecified site: Secondary | ICD-10-CM

## 2011-07-20 DIAGNOSIS — E7800 Pure hypercholesterolemia, unspecified: Secondary | ICD-10-CM

## 2011-07-20 DIAGNOSIS — K219 Gastro-esophageal reflux disease without esophagitis: Secondary | ICD-10-CM

## 2011-07-20 DIAGNOSIS — N259 Disorder resulting from impaired renal tubular function, unspecified: Secondary | ICD-10-CM

## 2011-07-20 DIAGNOSIS — N138 Other obstructive and reflux uropathy: Secondary | ICD-10-CM

## 2011-07-20 DIAGNOSIS — I872 Venous insufficiency (chronic) (peripheral): Secondary | ICD-10-CM

## 2011-07-20 DIAGNOSIS — D126 Benign neoplasm of colon, unspecified: Secondary | ICD-10-CM

## 2011-07-20 NOTE — Patient Instructions (Signed)
Today we updated your med list in our EPIC system...    Continue your current medications the same...  Today we gave you the 2012 Flu vaccine...  Good luck on the up-coming knee surg, you will need a CXR & EKG prior to that procedure...  Call for any questions...  Let's plan a follow up visit in 6 months w/ FASTING blood work at that time.Marland KitchenMarland Kitchen
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
## 2011-08-10 ENCOUNTER — Encounter (HOSPITAL_COMMUNITY): Payer: Self-pay | Admitting: Pharmacy Technician

## 2011-08-17 ENCOUNTER — Other Ambulatory Visit (HOSPITAL_COMMUNITY): Payer: Self-pay | Admitting: Orthopaedic Surgery

## 2011-08-19 ENCOUNTER — Ambulatory Visit (HOSPITAL_COMMUNITY): Admission: RE | Admit: 2011-08-19 | Payer: Medicare Other | Source: Ambulatory Visit

## 2011-08-19 ENCOUNTER — Encounter (HOSPITAL_COMMUNITY)
Admission: RE | Admit: 2011-08-19 | Discharge: 2011-08-19 | Disposition: A | Discharge status: Home / Self Care | Payer: Medicare Other | Source: Ambulatory Visit | Attending: Orthopaedic Surgery | Admitting: Orthopaedic Surgery

## 2011-08-19 ENCOUNTER — Encounter (HOSPITAL_COMMUNITY): Payer: Self-pay | Admitting: Vascular Surgery

## 2011-08-19 ENCOUNTER — Other Ambulatory Visit: Payer: Self-pay

## 2011-08-19 ENCOUNTER — Encounter (HOSPITAL_COMMUNITY): Payer: Self-pay

## 2011-08-19 DIAGNOSIS — Z01812 Encounter for preprocedural laboratory examination: Secondary | ICD-10-CM | POA: Insufficient documentation

## 2011-08-19 DIAGNOSIS — Z01818 Encounter for other preprocedural examination: Secondary | ICD-10-CM | POA: Insufficient documentation

## 2011-08-19 DIAGNOSIS — Z01810 Encounter for preprocedural cardiovascular examination: Secondary | ICD-10-CM | POA: Insufficient documentation

## 2011-08-19 HISTORY — DX: Blindness, one eye, unspecified eye: H54.40

## 2011-08-19 LAB — ABO/RH: ABO/RH(D): A POS

## 2011-08-19 LAB — URINALYSIS, ROUTINE W REFLEX MICROSCOPIC
Ketones, ur: NEGATIVE mg/dL
Leukocytes, UA: NEGATIVE
Nitrite: NEGATIVE
Specific Gravity, Urine: 1.017 (ref 1.005–1.030)
Urobilinogen, UA: 1 mg/dL (ref 0.0–1.0)
pH: 7 (ref 5.0–8.0)

## 2011-08-19 LAB — PROTIME-INR
INR: 0.96 (ref 0.00–1.49)
Prothrombin Time: 13 seconds (ref 11.6–15.2)

## 2011-08-19 LAB — TYPE AND SCREEN
ABO/RH(D): A POS
Antibody Screen: NEGATIVE

## 2011-08-19 LAB — COMPREHENSIVE METABOLIC PANEL
AST: 23 U/L (ref 0–37)
Albumin: 3.8 g/dL (ref 3.5–5.2)
Calcium: 9.5 mg/dL (ref 8.4–10.5)
Creatinine, Ser: 2.1 mg/dL — ABNORMAL HIGH (ref 0.50–1.35)
GFR calc non Af Amer: 29 mL/min — ABNORMAL LOW (ref >90)

## 2011-08-19 LAB — CBC
MCH: 33.2 pg (ref 26.0–34.0)
MCV: 95.1 fL (ref 78.0–100.0)
Platelets: 164 10*3/uL (ref 150–400)
RDW: 12.4 % (ref 11.5–15.5)

## 2011-08-19 NOTE — Progress Notes (Signed)
Chart left for anesthesia review of labs.
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
## 2011-08-19 NOTE — Progress Notes (Signed)
Pt left before having CXR, will need DOS.

## 2011-08-19 NOTE — Pre-Procedure Instructions (Addendum)
Cas[place]  08/19/2011   Your procedure is scheduled on:  Thursday, December 28  Report to [place] at 0800 AM.  Call this number if you have problems the morning of surgery: [phone]   Remember:   Do not eat food:After Midnight.  May have clear liquids: up to 4 Hours before arrival.  Clear liquids include soda, tea, black coffee, apple or grape juice, broth.  Take these medicines the morning of surgery with A SIP OF WATER: Vicodin, labetalol   Do not wear jewelry, make-up or nail polish.  Do not wear lotions, powders, or perfumes. You may wear deodorant.  Do not shave 48 hours prior to surgery.  Do not bring valuables to the hospital.  Contacts, dentures or bridgework may not be worn into surgery.  Leave suitcase in the car. After surgery it may be brought to your room.  For patients admitted to the hospital, checkout time is 11:00 AM the day of discharge.   Patients discharged the day of surgery will not be allowed to drive home.  Name and phone number of your driver: NA  Special Instructions: CHG Shower Use Special Wash: 1/2 bottle night before surgery and 1/2 bottle morning of surgery.   Please read over the following fact sheets that you were given: Pain Booklet, Coughing and Deep Breathing, Blood Transfusion Information and Anesthesia Post-op Instructions

## 2011-08-23 ENCOUNTER — Encounter: Payer: Self-pay | Admitting: Pulmonary Disease

## 2011-08-27 ENCOUNTER — Inpatient Hospital Stay (HOSPITAL_COMMUNITY): Admission: RE | Admit: 2011-08-27 | Payer: Medicare Other | Source: Ambulatory Visit | Admitting: Orthopaedic Surgery

## 2011-08-27 ENCOUNTER — Encounter (HOSPITAL_COMMUNITY): Admission: RE | Payer: Self-pay | Source: Ambulatory Visit

## 2011-08-27 SURGERY — ARTHROPLASTY, KNEE, TOTAL, USING IMAGELESS COMPUTER-ASSISTED NAVIGATION
Anesthesia: General | Site: Knee | Laterality: Left

## 2011-09-13 ENCOUNTER — Telehealth: Payer: Self-pay | Admitting: Pulmonary Disease

## 2011-09-13 MED ORDER — AZITHROMYCIN 250 MG PO TABS: ORAL_TABLET | ORAL | Status: AC

## 2011-09-13 NOTE — Telephone Encounter (Signed)
Per TP---ok for pt to have zpak #1  Take as directed with no refills.  mucinex dm as directed with increase in fluids and will need ov if not improving or if worse to urgent  Care or ER.  thanks

## 2011-09-13 NOTE — Telephone Encounter (Signed)
I spoke with pt and is aware of TP recs. He voiced his understanding and rx was sent. Nothing further was needed

## 2011-09-13 NOTE — Telephone Encounter (Signed)
Pt c/o scratchy throat and cough with yellow mucus x 4 days. Nasal passages are stuffy in the mornings. He denies any sob, wheezing, Chest pain or fever. Requesting something be called in to help with his cough. Pls advsie. Allergies  Allergen Reactions  . Lisinopril     REACTION: INTOL to all ACE's  . Valsartan     REACTION: INTOL to all ARB's

## 2011-11-01 ENCOUNTER — Telehealth: Payer: Self-pay | Admitting: Pulmonary Disease

## 2011-11-01 MED ORDER — AZITHROMYCIN 250 MG PO TABS: ORAL_TABLET | ORAL | Status: AC

## 2011-11-01 NOTE — Telephone Encounter (Signed)
Per SN zpak #1 also  mucinex 1 po bid  With plenty of fluids .  Pt aware rx sent to [facility] on [address]

## 2011-11-01 NOTE — Telephone Encounter (Signed)
Patient states he is having cough-productive-white in color; chest congestion(feels as though its stuck in throat area), runny nose. Denies any SOB, wheezing, N&V, Diarrhea, fever /chills. Please advise.    Allergies: Lisinopril, Valsartan.

## 2011-11-03 ENCOUNTER — Telehealth: Payer: Self-pay | Admitting: Pulmonary Disease

## 2011-11-03 NOTE — Telephone Encounter (Signed)
Returning call can be reached at [phone].Greg [name]

## 2011-11-03 NOTE — Telephone Encounter (Signed)
This form is in SN folder to sign.  Once this has been signed we will fax this back.  thanks

## 2011-11-03 NOTE — Telephone Encounter (Signed)
LMOM TCB x1 for Greg [name].  I see where it was mentioned that the last fax was sent on 2.28.13 but would like to verify that it came to a correct fax number.  In the meantime, Leigh have you seen any requests for this patient?  Thanks.

## 2011-11-05 ENCOUNTER — Telehealth: Payer: Self-pay | Admitting: Pulmonary Disease

## 2011-11-05 MED ORDER — PREDNISONE (PAK) 5 MG PO TABS: ORAL_TABLET | ORAL | Status: DC

## 2011-11-05 NOTE — Telephone Encounter (Signed)
Advised pt of doc's recs.  Pt stated nothing further needed.  Satira Anis

## 2011-11-05 NOTE — Telephone Encounter (Signed)
Called, spoke with pt.  Z pak was sent in on 11/01/11.  Pt states he is feeling better but mucus has changed from white to now yellow.  Reports prod cough with yellow mucus, chest congestion, and runny nose.  Denies SOB, wheezing, f/c/s.  Has 1 pill left from z pak.  Started Robitussion last night for cough.  Requesting a stronger abx.  [facility].  Dr. Lenna Gilford, pls advise.  Thank you.  Allergies verified Allergies  Allergen Reactions  . Lisinopril     REACTION: INTOL to all ACE's  . Valsartan     REACTION: INTOL to all ARB's

## 2011-11-05 NOTE — Telephone Encounter (Signed)
Per SN---ok for pt to have pred dosepak  5 mg   6 day pack  Take as directed and use mucinex  otc   2 po bid with plenty of fluids.  lmomtcb for pt to make him aware.

## 2011-11-08 ENCOUNTER — Telehealth: Payer: Self-pay | Admitting: Pulmonary Disease

## 2011-11-08 ENCOUNTER — Encounter (INDEPENDENT_AMBULATORY_CARE_PROVIDER_SITE_OTHER): Payer: Medicare Other | Admitting: Ophthalmology

## 2011-11-08 DIAGNOSIS — H3530 Unspecified macular degeneration: Secondary | ICD-10-CM

## 2011-11-08 DIAGNOSIS — H43819 Vitreous degeneration, unspecified eye: Secondary | ICD-10-CM

## 2011-11-08 DIAGNOSIS — Q143 Congenital malformation of choroid: Secondary | ICD-10-CM

## 2011-11-08 DIAGNOSIS — H2510 Age-related nuclear cataract, unspecified eye: Secondary | ICD-10-CM

## 2011-11-08 DIAGNOSIS — I10 Essential (primary) hypertension: Secondary | ICD-10-CM

## 2011-11-08 DIAGNOSIS — H35039 Hypertensive retinopathy, unspecified eye: Secondary | ICD-10-CM

## 2011-11-08 NOTE — Telephone Encounter (Signed)
We have the form- see phone note dated 11/03/11. We fill fax this back once SN has signed. I spoke with Estill Bamberg and notified her of this and advised there is no need for them to keep calling and checking on status, it will be faxed back once it's done. She verbalized understanding.

## 2011-11-08 NOTE — Telephone Encounter (Signed)
See 11/05/11 phone note

## 2011-12-15 ENCOUNTER — Other Ambulatory Visit: Payer: Self-pay | Admitting: Pulmonary Disease

## 2011-12-24 ENCOUNTER — Encounter: Payer: Self-pay | Admitting: Internal Medicine

## 2012-01-03 ENCOUNTER — Encounter: Payer: Self-pay | Admitting: Internal Medicine

## 2012-01-18 ENCOUNTER — Ambulatory Visit (INDEPENDENT_AMBULATORY_CARE_PROVIDER_SITE_OTHER): Payer: Medicare Other | Admitting: Pulmonary Disease

## 2012-01-18 ENCOUNTER — Encounter: Payer: Self-pay | Admitting: Pulmonary Disease

## 2012-01-18 ENCOUNTER — Ambulatory Visit (INDEPENDENT_AMBULATORY_CARE_PROVIDER_SITE_OTHER)
Admission: RE | Admit: 2012-01-18 | Discharge: 2012-01-18 | Disposition: A | Discharge status: Home / Self Care | Payer: Medicare Other | Source: Ambulatory Visit | Attending: Pulmonary Disease | Admitting: Pulmonary Disease

## 2012-01-18 ENCOUNTER — Other Ambulatory Visit (INDEPENDENT_AMBULATORY_CARE_PROVIDER_SITE_OTHER): Payer: Medicare Other

## 2012-01-18 VITALS — BP 140/88 | HR 71 | Temp 97.1°F | Ht 68.0 in | Wt 177.4 lb

## 2012-01-18 DIAGNOSIS — N139 Obstructive and reflux uropathy, unspecified: Secondary | ICD-10-CM

## 2012-01-18 DIAGNOSIS — E7800 Pure hypercholesterolemia, unspecified: Secondary | ICD-10-CM

## 2012-01-18 DIAGNOSIS — I872 Venous insufficiency (chronic) (peripheral): Secondary | ICD-10-CM

## 2012-01-18 DIAGNOSIS — N138 Other obstructive and reflux uropathy: Secondary | ICD-10-CM

## 2012-01-18 DIAGNOSIS — M1990 Unspecified osteoarthritis, unspecified site: Secondary | ICD-10-CM

## 2012-01-18 DIAGNOSIS — F411 Generalized anxiety disorder: Secondary | ICD-10-CM

## 2012-01-18 DIAGNOSIS — Z23 Encounter for immunization: Secondary | ICD-10-CM

## 2012-01-18 DIAGNOSIS — R7309 Other abnormal glucose: Secondary | ICD-10-CM

## 2012-01-18 DIAGNOSIS — D126 Benign neoplasm of colon, unspecified: Secondary | ICD-10-CM

## 2012-01-18 DIAGNOSIS — E291 Testicular hypofunction: Secondary | ICD-10-CM

## 2012-01-18 DIAGNOSIS — I10 Essential (primary) hypertension: Secondary | ICD-10-CM

## 2012-01-18 DIAGNOSIS — N259 Disorder resulting from impaired renal tubular function, unspecified: Secondary | ICD-10-CM

## 2012-01-18 DIAGNOSIS — F419 Anxiety disorder, unspecified: Secondary | ICD-10-CM

## 2012-01-18 DIAGNOSIS — N401 Enlarged prostate with lower urinary tract symptoms: Secondary | ICD-10-CM

## 2012-01-18 DIAGNOSIS — M25562 Pain in left knee: Secondary | ICD-10-CM

## 2012-01-18 DIAGNOSIS — K219 Gastro-esophageal reflux disease without esophagitis: Secondary | ICD-10-CM

## 2012-01-18 LAB — LIPID PANEL
LDL Cholesterol: 68 mg/dL (ref 0–99)
Total CHOL/HDL Ratio: 2
VLDL: 7.8 mg/dL (ref 0.0–40.0)

## 2012-01-18 LAB — TSH: TSH: 4.88 u[IU]/mL (ref 0.35–5.50)

## 2012-01-18 LAB — HEPATIC FUNCTION PANEL
AST: 23 U/L (ref 0–37)
Alkaline Phosphatase: 37 U/L — ABNORMAL LOW (ref 39–117)
Bilirubin, Direct: 0.1 mg/dL (ref 0.0–0.3)
Total Bilirubin: 0.8 mg/dL (ref 0.3–1.2)

## 2012-01-18 LAB — CBC WITH DIFFERENTIAL/PLATELET
Basophils Absolute: 0 10*3/uL (ref 0.0–0.1)
HCT: 42.6 % (ref 39.0–52.0)
Hemoglobin: 14.1 g/dL (ref 13.0–17.0)
Lymphs Abs: 1 10*3/uL (ref 0.7–4.0)
MCHC: 33.1 g/dL (ref 30.0–36.0)
MCV: 98.3 fl (ref 78.0–100.0)
Monocytes Absolute: 0.5 10*3/uL (ref 0.1–1.0)
Neutro Abs: 2 10*3/uL (ref 1.4–7.7)
Platelets: 160 10*3/uL (ref 150.0–400.0)
RDW: 14.1 % (ref 11.5–14.6)

## 2012-01-18 LAB — BASIC METABOLIC PANEL
Calcium: 9 mg/dL (ref 8.4–10.5)
GFR: 46.43 mL/min — ABNORMAL LOW (ref >60.00)
Glucose, Bld: 97 mg/dL (ref 70–99)
Potassium: 5.2 mEq/L — ABNORMAL HIGH (ref 3.5–5.1)
Sodium: 142 mEq/L (ref 135–145)

## 2012-01-18 NOTE — Patient Instructions (Signed)
Today we updated your med list in our EPIC system...    Continue your current medications the same...  Today we did your follow up FASTING blood work & CXR...    We will call you w/ these reports later this week...  We also gave you the combination Tetanus vaccine called the TDAP (it should be good for 89yrs)...  Call for any questions...  Let's plan another follow up visit in 6 months.Marland KitchenMarland Kitchen
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
## 2012-01-24 ENCOUNTER — Encounter: Payer: Self-pay | Admitting: Pulmonary Disease

## 2012-01-26 ENCOUNTER — Ambulatory Visit (AMBULATORY_SURGERY_CENTER): Payer: Medicare Other | Admitting: *Deleted

## 2012-01-26 ENCOUNTER — Encounter: Payer: Self-pay | Admitting: Internal Medicine

## 2012-01-26 VITALS — Ht 69.0 in | Wt 177.1 lb

## 2012-01-26 DIAGNOSIS — Z86010 Personal history of colonic polyps: Secondary | ICD-10-CM

## 2012-01-26 DIAGNOSIS — Z1211 Encounter for screening for malignant neoplasm of colon: Secondary | ICD-10-CM

## 2012-01-26 MED ORDER — PEG-KCL-NACL-NASULF-NA ASC-C 100 G PO SOLR: 1.0000 | Freq: Once | ORAL | Status: DC

## 2012-01-31 ENCOUNTER — Telehealth: Payer: Self-pay | Admitting: Pulmonary Disease

## 2012-01-31 NOTE — Telephone Encounter (Signed)
Called and spoke with pt and he stated that he has a form for a knee and back brace.  Pt is requesting this form be filled out.  Will hold this message --pt will come by in the morning and drop this form off.

## 2012-02-01 NOTE — Telephone Encounter (Signed)
Pt has dropped off form.  I gave this form to Leigh.  Satira Anis

## 2012-02-03 ENCOUNTER — Telehealth: Payer: Self-pay | Admitting: Pulmonary Disease

## 2012-02-03 ENCOUNTER — Other Ambulatory Visit: Payer: Self-pay | Admitting: Pulmonary Disease

## 2012-02-03 NOTE — Telephone Encounter (Signed)
Ov notes have been faxed.   thanks

## 2012-02-03 NOTE — Telephone Encounter (Signed)
Forms have been faxed back to [employer] at [phone]

## 2012-02-04 ENCOUNTER — Other Ambulatory Visit: Payer: Self-pay | Admitting: Pulmonary Disease

## 2012-02-05 ENCOUNTER — Other Ambulatory Visit: Payer: Self-pay | Admitting: Pulmonary Disease

## 2012-02-09 ENCOUNTER — Telehealth: Payer: Self-pay | Admitting: Pulmonary Disease

## 2012-02-09 ENCOUNTER — Encounter: Payer: Self-pay | Admitting: Internal Medicine

## 2012-02-09 ENCOUNTER — Ambulatory Visit (AMBULATORY_SURGERY_CENTER): Payer: Medicare Other | Admitting: Internal Medicine

## 2012-02-09 VITALS — BP 195/73 | HR 67 | Temp 98.5°F | Resp 16 | Ht 69.0 in | Wt 177.0 lb

## 2012-02-09 DIAGNOSIS — Z86010 Personal history of colonic polyps: Secondary | ICD-10-CM

## 2012-02-09 DIAGNOSIS — Z1211 Encounter for screening for malignant neoplasm of colon: Secondary | ICD-10-CM

## 2012-02-09 MED ORDER — SODIUM CHLORIDE 0.9 % IV SOLN: 500.0000 mL | INTRAVENOUS | Status: DC

## 2012-02-09 NOTE — Op Note (Signed)
[facility] [address] [place], [place]  [address]  COLONOSCOPY PROCEDURE REPORT  PATIENT:  Greg [name], Greg [name]  MR#:  [id number] BIRTHDATE:  1932-12-15, 78 yrs. old  GENDER:  male ENDOSCOPIST:  Docia Chuck. Geri Seminole, MD REF. BY:  Surveillance Program Recall, PROCEDURE DATE:  02/09/2012 PROCEDURE:  Surveillance Colonoscopy ASA CLASS:  Class II INDICATIONS:  history of pre-cancerous (adenomatous) colon polyps, surveillance and high-risk screening ; index 2003 w/ hpp; 2008 w/ TA MEDICATIONS:   MAC sedation, administered by CRNA, propofol (Diprivan) 100 mg IV  DESCRIPTION OF PROCEDURE:   After the risks benefits and alternatives of the procedure were thoroughly explained, informed consent was obtained.  Digital rectal exam was performed and revealed no abnormalities.   The LB [id number] endoscope was introduced through the anus and advanced to the cecum, which was identified by both the appendix and ileocecal valve, without limitations.  The quality of the prep was excellent, using MoviPrep.  The instrument was then slowly withdrawn as the colon was fully examined. <<PROCEDUREIMAGES>>  FINDINGS:  A normal appearing cecum, ileocecal valve, and appendiceal orifice were identified. The ascending, hepatic flexure, transverse, splenic flexure, descending, sigmoid colon, and rectum appeared unremarkable.  No polyps or cancers were seen. Retroflexed views in the rectum revealed no abnormalities.    The time to cecum =   1:53  minutes. The scope was then withdrawn in 7:08  minutes from the cecum and the procedure completed.  COMPLICATIONS:  None  ENDOSCOPIC IMPRESSION: 1) Normal colon 2) No polyps or cancers  RECOMMENDATIONS: 1) Return to the care of your primary provider. GI follow up as needed  ______________________________ Docia Chuck. Geri Seminole, MD  CC:  Noralee Space, MD;  The Patient  n. eSIGNED:   Docia Chuck. Geri Seminole at 02/09/2012 02:41 PM  Bess Kinds,  [id number]

## 2012-02-09 NOTE — Telephone Encounter (Signed)
Leigh, do you know if this is true and is it okay to send OV notes. Thanks.

## 2012-02-09 NOTE — Progress Notes (Signed)
Patient did not experience any of the following events: a burn prior to discharge; a fall within the facility; wrong site/side/patient/procedure/implant event; or a hospital transfer or hospital admission upon discharge from the facility. (G8907) Patient did not have preoperative order for IV antibiotic SSI prophylaxis. (G8918)  

## 2012-02-09 NOTE — Patient Instructions (Addendum)
YOU HAD AN ENDOSCOPIC PROCEDURE TODAY AT THE [facility]: Refer to the procedure report that was given to you for any specific questions about what was found during the examination.  If the procedure report does not answer your questions, please call your gastroenterologist to clarify.  If you requested that your care partner not be given the details of your procedure findings, then the procedure report has been included in a sealed envelope for you to review at your convenience later.  YOU SHOULD EXPECT: Some feelings of bloating in the abdomen. Passage of more gas than usual.  Walking can help get rid of the air that was put into your GI tract during the procedure and reduce the bloating. If you had a lower endoscopy (such as a colonoscopy or flexible sigmoidoscopy) you may notice spotting of blood in your stool or on the toilet paper. If you underwent a bowel prep for your procedure, then you may not have a normal bowel movement for a few days.  DIET: Your first meal following the procedure should be a light meal and then it is ok to progress to your normal diet.  A half-sandwich or bowl of soup is an example of a good first meal.  Heavy or fried foods are harder to digest and may make you feel nauseous or bloated.  Likewise meals heavy in dairy and vegetables can cause extra gas to form and this can also increase the bloating.  Drink plenty of fluids but you should avoid alcoholic beverages for 24 hours.  ACTIVITY: Your care partner should take you home directly after the procedure.  You should plan to take it easy, moving slowly for the rest of the day.  You can resume normal activity the day after the procedure however you should NOT DRIVE or use heavy machinery for 24 hours (because of the sedation medicines used during the test).    SYMPTOMS TO REPORT IMMEDIATELY: A gastroenterologist can be reached at any hour.  During normal business hours, 8:30 AM to 5:00 PM Monday through Friday,  call [phone].  After hours and on weekends, please call the GI answering service at [phone] who will take a message and have the physician on call contact you.   Following lower endoscopy (colonoscopy or flexible sigmoidoscopy):  Excessive amounts of blood in the stool  Significant tenderness or worsening of abdominal pains  Swelling of the abdomen that is new, acute  Fever of 100F or higher  Following upper endoscopy (EGD)  Vomiting of blood or coffee ground material  New chest pain or pain under the shoulder blades  Painful or persistently difficult swallowing  New shortness of breath  Fever of 100F or higher  Black, tarry-looking stools  FOLLOW UP: If any biopsies were taken you will be contacted by phone or by letter within the next 1-3 weeks.  Call your gastroenterologist if you have not heard about the biopsies in 3 weeks.  Our staff will call the home number listed on your records the next business day following your procedure to check on you and address any questions or concerns that you may have at that time regarding the information given to you following your procedure. This is a courtesy call and so if there is no answer at the home number and we have not heard from you through the emergency physician on call, we will assume that you have returned to your regular daily activities without incident.  SIGNATURES/CONFIDENTIALITY: You and/or your care   partner have signed paperwork which will be entered into your electronic medical record.  These signatures attest to the fact that that the information above on your After Visit Summary has been reviewed and is understood.  Full responsibility of the confidentiality of this discharge information lies with you and/or your care-partner.  

## 2012-02-10 ENCOUNTER — Telehealth: Payer: Self-pay | Admitting: *Deleted

## 2012-02-10 NOTE — Telephone Encounter (Signed)
No answer left message to call if questions or concerns. 

## 2012-02-14 NOTE — Telephone Encounter (Signed)
i faxed OV notes to them back on 6-6.

## 2012-02-14 NOTE — Telephone Encounter (Signed)
ATC the number listed and msg states not in service. I am closing encounter.

## 2012-02-22 NOTE — Telephone Encounter (Signed)
Left message

## 2012-05-25 ENCOUNTER — Telehealth: Payer: Self-pay | Admitting: Pulmonary Disease

## 2012-05-25 NOTE — Telephone Encounter (Signed)
Pt scheduled for flu shot tomorrow am at 9:00- shot given to Terrell with the pt's name on it.

## 2012-05-26 ENCOUNTER — Ambulatory Visit (INDEPENDENT_AMBULATORY_CARE_PROVIDER_SITE_OTHER): Payer: Medicare Other

## 2012-05-26 DIAGNOSIS — Z23 Encounter for immunization: Secondary | ICD-10-CM

## 2012-05-29 DIAGNOSIS — Z23 Encounter for immunization: Secondary | ICD-10-CM

## 2012-07-10 ENCOUNTER — Other Ambulatory Visit (HOSPITAL_COMMUNITY): Payer: Self-pay | Admitting: Orthopaedic Surgery

## 2012-07-11 ENCOUNTER — Encounter (HOSPITAL_COMMUNITY): Payer: Self-pay

## 2012-07-11 ENCOUNTER — Encounter (HOSPITAL_COMMUNITY)
Admission: RE | Admit: 2012-07-11 | Discharge: 2012-07-11 | Disposition: A | Discharge status: Home / Self Care | Payer: Medicare Other | Source: Ambulatory Visit | Attending: Orthopaedic Surgery | Admitting: Orthopaedic Surgery

## 2012-07-11 LAB — CBC
MCH: 33.6 pg (ref 26.0–34.0)
MCV: 95.2 fL (ref 78.0–100.0)
Platelets: 189 10*3/uL (ref 150–400)
RDW: 11.9 % (ref 11.5–15.5)
WBC: 3.9 10*3/uL — ABNORMAL LOW (ref 4.0–10.5)

## 2012-07-11 LAB — TYPE AND SCREEN: Antibody Screen: NEGATIVE

## 2012-07-11 LAB — PROTIME-INR: INR: 0.94 (ref 0.00–1.49)

## 2012-07-11 LAB — URINALYSIS, ROUTINE W REFLEX MICROSCOPIC
Leukocytes, UA: NEGATIVE
Nitrite: NEGATIVE
Specific Gravity, Urine: 1.014 (ref 1.005–1.030)
pH: 7 (ref 5.0–8.0)

## 2012-07-11 LAB — COMPREHENSIVE METABOLIC PANEL
AST: 27 U/L (ref 0–37)
Albumin: 4.1 g/dL (ref 3.5–5.2)
Calcium: 10 mg/dL (ref 8.4–10.5)
Chloride: 103 mEq/L (ref 96–112)
Creatinine, Ser: 1.8 mg/dL — ABNORMAL HIGH (ref 0.50–1.35)

## 2012-07-11 NOTE — Pre-Procedure Instructions (Addendum)
Ord  07/11/2012   Your procedure is scheduled on:  07/21/12  Report to [facility] at 530* AM.  Call this number if you have problems the morning of surgery: [phone]   Remember:   Do not eat food or drink:After Midnight.    Take these medicines the morning of surgery with A SIP OF WATER: flomax, labetatol  STOP aspirin, diclofenac, multi vit fri 07/14/12   Do not wear jewelry, make-up or nail polish.  Do not wear lotions, powders, or perfumes. .  Do not shave 48 hours prior to surgery. Men may shave face and neck.  Do not bring valuables to the hospital.  Contacts, dentures or bridgework may not be worn into surgery.  Leave suitcase in the car. After surgery it may be brought to your room.  For patients admitted to the hospital, checkout time is 11:00 AM the day of discharge.   Patients discharged the day of surgery will not be allowed to drive home.  Name and phone number of your driver:Denny r Rauls  [phone]  Special Instructions: Shower using CHG 2 nights before surgery and the night before surgery.  If you shower the day of surgery use CHG.  Use special wash - you have one bottle of CHG for all showers.  You should use approximately 1/3 of the bottle for each shower.   Please read over the following fact sheets that you were given: Pain Booklet, Coughing and Deep Breathing, Blood Transfusion Information, Total Joint Packet, MRSA Information and Surgical Site Infection Prevention

## 2012-07-14 ENCOUNTER — Encounter: Payer: Self-pay | Admitting: *Deleted

## 2012-07-17 ENCOUNTER — Ambulatory Visit (INDEPENDENT_AMBULATORY_CARE_PROVIDER_SITE_OTHER): Payer: Medicare Other | Admitting: Pulmonary Disease

## 2012-07-17 ENCOUNTER — Encounter: Payer: Self-pay | Admitting: Pulmonary Disease

## 2012-07-17 VITALS — BP 150/84 | HR 60 | Temp 97.0°F | Ht 68.0 in | Wt 175.6 lb

## 2012-07-17 DIAGNOSIS — D126 Benign neoplasm of colon, unspecified: Secondary | ICD-10-CM

## 2012-07-17 DIAGNOSIS — K219 Gastro-esophageal reflux disease without esophagitis: Secondary | ICD-10-CM

## 2012-07-17 DIAGNOSIS — E291 Testicular hypofunction: Secondary | ICD-10-CM

## 2012-07-17 DIAGNOSIS — I10 Essential (primary) hypertension: Secondary | ICD-10-CM

## 2012-07-17 DIAGNOSIS — M25569 Pain in unspecified knee: Secondary | ICD-10-CM

## 2012-07-17 DIAGNOSIS — N259 Disorder resulting from impaired renal tubular function, unspecified: Secondary | ICD-10-CM

## 2012-07-17 DIAGNOSIS — E7800 Pure hypercholesterolemia, unspecified: Secondary | ICD-10-CM

## 2012-07-17 DIAGNOSIS — N138 Other obstructive and reflux uropathy: Secondary | ICD-10-CM

## 2012-07-17 DIAGNOSIS — M1990 Unspecified osteoarthritis, unspecified site: Secondary | ICD-10-CM

## 2012-07-17 DIAGNOSIS — I872 Venous insufficiency (chronic) (peripheral): Secondary | ICD-10-CM

## 2012-07-17 DIAGNOSIS — R7309 Other abnormal glucose: Secondary | ICD-10-CM

## 2012-07-17 NOTE — Patient Instructions (Addendum)
Today we updated your med list in our EPIC system...    Continue your current medications the same...  We reviewed your recent CXR, EKG, & blod work...  Good luck w/ the up coming knee surg...  Call for any problems...  Let's plan a routine follow up w/ FASTING labs in 6 months.Marland KitchenMarland Kitchen
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
## 2012-07-20 MED ORDER — CEFAZOLIN SODIUM-DEXTROSE 2-3 GM-% IV SOLR
2.0000 g | INTRAVENOUS | Status: DC
  Administered 2012-07-21: 2 g via INTRAVENOUS
  Filled 2012-07-20: qty 50
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
## 2012-07-21 ENCOUNTER — Encounter (HOSPITAL_COMMUNITY)
Admission: RE | Disposition: A | Discharge status: Home Health | Payer: Self-pay | Source: Ambulatory Visit | Attending: Orthopaedic Surgery

## 2012-07-21 ENCOUNTER — Inpatient Hospital Stay (HOSPITAL_COMMUNITY)
Admission: RE | Admit: 2012-07-21 | Discharge: 2012-07-23 | DRG: 467 | Disposition: A | Discharge status: Home Health | Payer: Medicare Other | Source: Ambulatory Visit | Attending: Orthopaedic Surgery | Admitting: Orthopaedic Surgery

## 2012-07-21 ENCOUNTER — Encounter (HOSPITAL_COMMUNITY): Payer: Self-pay | Admitting: Anesthesiology

## 2012-07-21 ENCOUNTER — Encounter (HOSPITAL_COMMUNITY): Payer: Self-pay | Admitting: *Deleted

## 2012-07-21 ENCOUNTER — Ambulatory Visit (HOSPITAL_COMMUNITY): Payer: Medicare Other | Admitting: Anesthesiology

## 2012-07-21 ENCOUNTER — Encounter (HOSPITAL_COMMUNITY): Payer: Self-pay | Admitting: Pharmacy Technician

## 2012-07-21 DIAGNOSIS — T84038A Mechanical loosening of other internal prosthetic joint, initial encounter: Secondary | ICD-10-CM

## 2012-07-21 DIAGNOSIS — E7800 Pure hypercholesterolemia, unspecified: Secondary | ICD-10-CM | POA: Diagnosis present

## 2012-07-21 DIAGNOSIS — H409 Unspecified glaucoma: Secondary | ICD-10-CM | POA: Diagnosis present

## 2012-07-21 DIAGNOSIS — Z79899 Other long term (current) drug therapy: Secondary | ICD-10-CM

## 2012-07-21 DIAGNOSIS — E119 Type 2 diabetes mellitus without complications: Secondary | ICD-10-CM | POA: Diagnosis present

## 2012-07-21 DIAGNOSIS — Z7982 Long term (current) use of aspirin: Secondary | ICD-10-CM

## 2012-07-21 DIAGNOSIS — T84039A Mechanical loosening of unspecified internal prosthetic joint, initial encounter: Principal | ICD-10-CM | POA: Diagnosis present

## 2012-07-21 DIAGNOSIS — F411 Generalized anxiety disorder: Secondary | ICD-10-CM | POA: Diagnosis present

## 2012-07-21 DIAGNOSIS — N289 Disorder of kidney and ureter, unspecified: Secondary | ICD-10-CM | POA: Diagnosis present

## 2012-07-21 DIAGNOSIS — I10 Essential (primary) hypertension: Secondary | ICD-10-CM | POA: Diagnosis present

## 2012-07-21 DIAGNOSIS — I872 Venous insufficiency (chronic) (peripheral): Secondary | ICD-10-CM | POA: Diagnosis present

## 2012-07-21 DIAGNOSIS — H5440 Blindness, one eye, unspecified eye: Secondary | ICD-10-CM | POA: Diagnosis present

## 2012-07-21 DIAGNOSIS — D62 Acute posthemorrhagic anemia: Secondary | ICD-10-CM | POA: Diagnosis not present

## 2012-07-21 DIAGNOSIS — N138 Other obstructive and reflux uropathy: Secondary | ICD-10-CM | POA: Diagnosis present

## 2012-07-21 DIAGNOSIS — Y831 Surgical operation with implant of artificial internal device as the cause of abnormal reaction of the patient, or of later complication, without mention of misadventure at the time of the procedure: Secondary | ICD-10-CM | POA: Diagnosis present

## 2012-07-21 DIAGNOSIS — Z96659 Presence of unspecified artificial knee joint: Secondary | ICD-10-CM

## 2012-07-21 DIAGNOSIS — N401 Enlarged prostate with lower urinary tract symptoms: Secondary | ICD-10-CM | POA: Diagnosis present

## 2012-07-21 DIAGNOSIS — M1990 Unspecified osteoarthritis, unspecified site: Secondary | ICD-10-CM | POA: Diagnosis present

## 2012-07-21 DIAGNOSIS — K219 Gastro-esophageal reflux disease without esophagitis: Secondary | ICD-10-CM | POA: Diagnosis present

## 2012-07-21 HISTORY — PX: TOTAL KNEE REVISION: SHX996

## 2012-07-21 SURGERY — TOTAL KNEE REVISION
Anesthesia: General | Site: Knee | Laterality: Right | Wound class: Clean

## 2012-07-21 MED ORDER — MIDAZOLAM HCL 5 MG/5ML IJ SOLN
INTRAMUSCULAR | Status: DC | PRN
  Administered 2012-07-21: 2 mg via INTRAVENOUS

## 2012-07-21 MED ORDER — CEFAZOLIN SODIUM-DEXTROSE 2-3 GM-% IV SOLR
2.0000 g | Freq: Once | INTRAVENOUS | Status: AC
  Administered 2012-07-21: 2 g via INTRAVENOUS
  Filled 2012-07-21: qty 50

## 2012-07-21 MED ORDER — HYDROMORPHONE HCL PF 1 MG/ML IJ SOLN
0.2500 mg | INTRAMUSCULAR | Status: DC | PRN
  Administered 2012-07-21 (×3): 0.5 mg via INTRAVENOUS

## 2012-07-21 MED ORDER — ONDANSETRON HCL 4 MG/2ML IJ SOLN: 4.0000 mg | Freq: Four times a day (QID) | INTRAMUSCULAR | Status: DC | PRN

## 2012-07-21 MED ORDER — LIDOCAINE HCL (CARDIAC) 20 MG/ML IV SOLN
INTRAVENOUS | Status: DC | PRN
  Administered 2012-07-21: 80 mg via INTRAVENOUS

## 2012-07-21 MED ORDER — MENTHOL 3 MG MT LOZG: 1.0000 | LOZENGE | OROMUCOSAL | Status: DC | PRN

## 2012-07-21 MED ORDER — ONDANSETRON HCL 4 MG/2ML IJ SOLN
INTRAMUSCULAR | Status: DC | PRN
  Administered 2012-07-21: 4 mg via INTRAVENOUS

## 2012-07-21 MED ORDER — SODIUM CHLORIDE 0.9 % IV SOLN
INTRAVENOUS | Status: DC
  Administered 2012-07-21: 75 mL/h via INTRAVENOUS

## 2012-07-21 MED ORDER — ALUMINUM HYDROXIDE GEL 320 MG/5ML PO SUSP
15.0000 mL | ORAL | Status: DC | PRN
  Administered 2012-07-21 – 2012-07-22 (×2): 30 mL via ORAL
  Filled 2012-07-21 (×3): qty 30

## 2012-07-21 MED ORDER — PROPOFOL 10 MG/ML IV BOLUS
INTRAVENOUS | Status: DC | PRN
  Administered 2012-07-21: 190 mg via INTRAVENOUS

## 2012-07-21 MED ORDER — KETOROLAC TROMETHAMINE 15 MG/ML IJ SOLN
7.5000 mg | Freq: Four times a day (QID) | INTRAMUSCULAR | Status: AC
  Administered 2012-07-21 – 2012-07-22 (×4): 7.5 mg via INTRAVENOUS
  Filled 2012-07-21 (×4): qty 1

## 2012-07-21 MED ORDER — BUPIVACAINE-EPINEPHRINE PF 0.5-1:200000 % IJ SOLN
INTRAMUSCULAR | Status: DC | PRN
  Administered 2012-07-21: 25 mL

## 2012-07-21 MED ORDER — ADULT MULTIVITAMIN W/MINERALS CH
1.0000 | ORAL_TABLET | Freq: Every day | ORAL | Status: DC
  Administered 2012-07-21 – 2012-07-23 (×3): 1 via ORAL
  Filled 2012-07-21 (×3): qty 1

## 2012-07-21 MED ORDER — TAMSULOSIN HCL 0.4 MG PO CAPS
0.4000 mg | ORAL_CAPSULE | Freq: Every day | ORAL | Status: DC
  Administered 2012-07-22 – 2012-07-23 (×2): 0.4 mg via ORAL
  Filled 2012-07-21 (×2): qty 1

## 2012-07-21 MED ORDER — SIMVASTATIN 40 MG PO TABS
40.0000 mg | ORAL_TABLET | Freq: Every evening | ORAL | Status: DC
  Administered 2012-07-21 – 2012-07-22 (×2): 40 mg via ORAL
  Filled 2012-07-21 (×3): qty 1

## 2012-07-21 MED ORDER — NEOSTIGMINE METHYLSULFATE 1 MG/ML IJ SOLN
INTRAMUSCULAR | Status: DC | PRN
  Administered 2012-07-21: 3.5 mg via INTRAVENOUS

## 2012-07-21 MED ORDER — METHOCARBAMOL 100 MG/ML IJ SOLN
500.0000 mg | INTRAVENOUS | Status: AC
  Administered 2012-07-21: 500 mg via INTRAVENOUS
  Filled 2012-07-21: qty 5

## 2012-07-21 MED ORDER — OXYCODONE HCL 5 MG PO TABS: 5.0000 mg | ORAL_TABLET | Freq: Once | ORAL | Status: DC | PRN

## 2012-07-21 MED ORDER — HYDROMORPHONE HCL PF 1 MG/ML IJ SOLN
INTRAMUSCULAR | Status: AC
  Filled 2012-07-21: qty 1

## 2012-07-21 MED ORDER — FENTANYL CITRATE 0.05 MG/ML IJ SOLN
INTRAMUSCULAR | Status: DC | PRN
  Administered 2012-07-21 (×2): 50 ug via INTRAVENOUS
  Administered 2012-07-21: 100 ug via INTRAVENOUS

## 2012-07-21 MED ORDER — ACETAMINOPHEN 325 MG PO TABS: 650.0000 mg | ORAL_TABLET | Freq: Four times a day (QID) | ORAL | Status: DC | PRN

## 2012-07-21 MED ORDER — DEXAMETHASONE SODIUM PHOSPHATE 4 MG/ML IJ SOLN
INTRAMUSCULAR | Status: DC | PRN
  Administered 2012-07-21: 4 mg

## 2012-07-21 MED ORDER — ACETAMINOPHEN 650 MG RE SUPP: 650.0000 mg | Freq: Four times a day (QID) | RECTAL | Status: DC | PRN

## 2012-07-21 MED ORDER — ROCURONIUM BROMIDE 100 MG/10ML IV SOLN
INTRAVENOUS | Status: DC | PRN
  Administered 2012-07-21: 40 mg via INTRAVENOUS

## 2012-07-21 MED ORDER — PHENOL 1.4 % MT LIQD: 1.0000 | OROMUCOSAL | Status: DC | PRN

## 2012-07-21 MED ORDER — ASPIRIN 325 MG PO TABS
325.0000 mg | ORAL_TABLET | Freq: Every day | ORAL | Status: DC
  Administered 2012-07-21 – 2012-07-23 (×3): 325 mg via ORAL
  Filled 2012-07-21 (×3): qty 1

## 2012-07-21 MED ORDER — DIPHENHYDRAMINE HCL 12.5 MG/5ML PO ELIX: 12.5000 mg | ORAL_SOLUTION | ORAL | Status: DC | PRN

## 2012-07-21 MED ORDER — METOCLOPRAMIDE HCL 5 MG/ML IJ SOLN: 5.0000 mg | Freq: Three times a day (TID) | INTRAMUSCULAR | Status: DC | PRN

## 2012-07-21 MED ORDER — SODIUM CHLORIDE 0.9 % IR SOLN
Status: DC | PRN
  Administered 2012-07-21: 1000 mL
  Administered 2012-07-21: 3000 mL

## 2012-07-21 MED ORDER — LACTATED RINGERS IV SOLN
INTRAVENOUS | Status: DC | PRN
  Administered 2012-07-21 (×2): via INTRAVENOUS

## 2012-07-21 MED ORDER — OXYCODONE HCL 5 MG PO TABS
5.0000 mg | ORAL_TABLET | ORAL | Status: DC | PRN
  Administered 2012-07-22 – 2012-07-23 (×8): 10 mg via ORAL
  Filled 2012-07-21 (×9): qty 2

## 2012-07-21 MED ORDER — ASPIRIN EC 325 MG PO TBEC: 325.0000 mg | DELAYED_RELEASE_TABLET | Freq: Every day | ORAL | Status: DC

## 2012-07-21 MED ORDER — OXYCODONE HCL 5 MG/5ML PO SOLN: 5.0000 mg | Freq: Once | ORAL | Status: DC | PRN

## 2012-07-21 MED ORDER — OXYCODONE HCL ER 10 MG PO T12A
10.0000 mg | EXTENDED_RELEASE_TABLET | Freq: Two times a day (BID) | ORAL | Status: DC
  Administered 2012-07-21 – 2012-07-23 (×5): 10 mg via ORAL
  Filled 2012-07-21 (×5): qty 1

## 2012-07-21 MED ORDER — HYDROMORPHONE HCL PF 1 MG/ML IJ SOLN
0.5000 mg | INTRAMUSCULAR | Status: DC | PRN
  Administered 2012-07-21 (×2): 0.5 mg via INTRAVENOUS
  Filled 2012-07-21: qty 1

## 2012-07-21 MED ORDER — ONDANSETRON HCL 4 MG PO TABS: 4.0000 mg | ORAL_TABLET | Freq: Four times a day (QID) | ORAL | Status: DC | PRN

## 2012-07-21 MED ORDER — METHOCARBAMOL 500 MG PO TABS
500.0000 mg | ORAL_TABLET | Freq: Four times a day (QID) | ORAL | Status: DC | PRN
  Administered 2012-07-22 – 2012-07-23 (×4): 500 mg via ORAL
  Filled 2012-07-21 (×4): qty 1

## 2012-07-21 MED ORDER — PHENYLEPHRINE HCL 10 MG/ML IJ SOLN
INTRAMUSCULAR | Status: DC | PRN
  Administered 2012-07-21: 40 ug via INTRAVENOUS
  Administered 2012-07-21 (×3): 80 ug via INTRAVENOUS
  Administered 2012-07-21: 40 ug via INTRAVENOUS

## 2012-07-21 MED ORDER — LABETALOL HCL 200 MG PO TABS
200.0000 mg | ORAL_TABLET | Freq: Two times a day (BID) | ORAL | Status: DC
  Administered 2012-07-21 – 2012-07-23 (×4): 200 mg via ORAL
  Filled 2012-07-21 (×5): qty 1

## 2012-07-21 MED ORDER — METOCLOPRAMIDE HCL 10 MG PO TABS: 5.0000 mg | ORAL_TABLET | Freq: Three times a day (TID) | ORAL | Status: DC | PRN

## 2012-07-21 MED ORDER — GLYCOPYRROLATE 0.2 MG/ML IJ SOLN
INTRAMUSCULAR | Status: DC | PRN
  Administered 2012-07-21: 0.6 mg via INTRAVENOUS

## 2012-07-21 MED ORDER — PROMETHAZINE HCL 25 MG/ML IJ SOLN: 6.2500 mg | INTRAMUSCULAR | Status: DC | PRN

## 2012-07-21 MED ORDER — MEPERIDINE HCL 25 MG/ML IJ SOLN: 6.2500 mg | INTRAMUSCULAR | Status: DC | PRN

## 2012-07-21 MED ORDER — METHOCARBAMOL 100 MG/ML IJ SOLN
500.0000 mg | Freq: Four times a day (QID) | INTRAVENOUS | Status: DC | PRN
  Filled 2012-07-21: qty 5

## 2012-07-21 SURGICAL SUPPLY — 67 items
BANDAGE ELASTIC 4 VELCRO ST LF (GAUZE/BANDAGES/DRESSINGS) IMPLANT
BANDAGE ELASTIC 6 VELCRO ST LF (GAUZE/BANDAGES/DRESSINGS) IMPLANT
BANDAGE ESMARK 6X9 LF (GAUZE/BANDAGES/DRESSINGS) ×1 IMPLANT
BLADE SAGITTAL 25.0X1.19X90 (BLADE) ×2 IMPLANT
BLADE SAGITTAL 25.0X1.27X90 (BLADE) ×2 IMPLANT
BLADE SURG ROTATE 9660 (MISCELLANEOUS) IMPLANT
BNDG CMPR 9X6 STRL LF SNTH (GAUZE/BANDAGES/DRESSINGS) ×1
BNDG ESMARK 6X9 LF (GAUZE/BANDAGES/DRESSINGS) ×2
BOWL SMART MIX CTS (DISPOSABLE) ×2 IMPLANT
CEMENT HV SMART SET (Cement) ×4 IMPLANT
CLOTH BEACON ORANGE TIMEOUT ST (SAFETY) ×2 IMPLANT
CLSR STERI-STRIP ANTIMIC 1/2X4 (GAUZE/BANDAGES/DRESSINGS) ×2 IMPLANT
COVER BACK TABLE 24X17X13 BIG (DRAPES) IMPLANT
COVER SURGICAL LIGHT HANDLE (MISCELLANEOUS) ×2 IMPLANT
CUFF TOURNIQUET SINGLE 34IN LL (TOURNIQUET CUFF) ×2 IMPLANT
CUFF TOURNIQUET SINGLE 44IN (TOURNIQUET CUFF) IMPLANT
DISC DIAMOND MED (BURR) IMPLANT
DRAPE ORTHO SPLIT 77X108 STRL (DRAPES) ×4
DRAPE SURG ORHT 6 SPLT 77X108 (DRAPES) ×2 IMPLANT
DRAPE U-SHAPE 47X51 STRL (DRAPES) ×2 IMPLANT
DRSG PAD ABDOMINAL 8X10 ST (GAUZE/BANDAGES/DRESSINGS) ×2 IMPLANT
DURAPREP 26ML APPLICATOR (WOUND CARE) ×4 IMPLANT
ELECT REM PT RETURN 9FT ADLT (ELECTROSURGICAL) ×2
ELECTRODE REM PT RTRN 9FT ADLT (ELECTROSURGICAL) ×1 IMPLANT
EVACUATOR 1/8 PVC DRAIN (DRAIN) IMPLANT
GAUZE XEROFORM 1X8 LF (GAUZE/BANDAGES/DRESSINGS) IMPLANT
GLOVE BIOGEL M 7.0 STRL (GLOVE) ×2 IMPLANT
GLOVE BIOGEL PI IND STRL 7.5 (GLOVE) ×1 IMPLANT
GLOVE BIOGEL PI IND STRL 8 (GLOVE) ×1 IMPLANT
GLOVE BIOGEL PI INDICATOR 7.5 (GLOVE) ×1
GLOVE BIOGEL PI INDICATOR 8 (GLOVE) ×1
GLOVE BIOGEL PI ORTHO PRO 7.5 (GLOVE) ×1
GLOVE ECLIPSE 7.0 STRL STRAW (GLOVE) IMPLANT
GLOVE ORTHO TXT STRL SZ7.5 (GLOVE) ×2 IMPLANT
GLOVE PI ORTHO PRO STRL 7.5 (GLOVE) ×1 IMPLANT
GLOVE SURG SS PI 7.5 STRL IVOR (GLOVE) ×2 IMPLANT
GOWN PREVENTION PLUS LG XLONG (DISPOSABLE) IMPLANT
GOWN PREVENTION PLUS XLARGE (GOWN DISPOSABLE) ×2 IMPLANT
GOWN STRL NON-REIN LRG LVL3 (GOWN DISPOSABLE) IMPLANT
GOWN STRL REIN XL XLG (GOWN DISPOSABLE) ×2 IMPLANT
HANDPIECE INTERPULSE COAX TIP (DISPOSABLE) ×2
KIT BASIN OR (CUSTOM PROCEDURE TRAY) ×2 IMPLANT
KIT ROOM TURNOVER OR (KITS) ×2 IMPLANT
MANIFOLD NEPTUNE II (INSTRUMENTS) ×2 IMPLANT
NEEDLE 1/2 CIR MAYO (NEEDLE) IMPLANT
NS IRRIG 1000ML POUR BTL (IV SOLUTION) ×2 IMPLANT
PACK TOTAL JOINT (CUSTOM PROCEDURE TRAY) ×2 IMPLANT
PAD ARMBOARD 7.5X6 YLW CONV (MISCELLANEOUS) ×4 IMPLANT
PAD CAST 4YDX4 CTTN HI CHSV (CAST SUPPLIES) IMPLANT
PADDING CAST COTTON 4X4 STRL (CAST SUPPLIES)
PADDING CAST COTTON 6X4 STRL (CAST SUPPLIES) IMPLANT
RASP HELIOCORDIAL MED (MISCELLANEOUS) IMPLANT
SET HNDPC FAN SPRY TIP SCT (DISPOSABLE) ×1 IMPLANT
SPONGE GAUZE 4X4 12PLY (GAUZE/BANDAGES/DRESSINGS) IMPLANT
STAPLER VISISTAT 35W (STAPLE) IMPLANT
SUCTION FRAZIER TIP 10 FR DISP (SUCTIONS) ×2 IMPLANT
SUT VIC AB 0 CT1 27 (SUTURE) ×4
SUT VIC AB 0 CT1 27XBRD ANBCTR (SUTURE) ×2 IMPLANT
SUT VIC AB 1 CT1 27 (SUTURE) ×4
SUT VIC AB 1 CT1 27XBRD ANBCTR (SUTURE) ×2 IMPLANT
SUT VIC AB 2-0 CT1 27 (SUTURE) ×2
SUT VIC AB 2-0 CT1 TAPERPNT 27 (SUTURE) ×2 IMPLANT
TOWEL OR 17X24 6PK STRL BLUE (TOWEL DISPOSABLE) ×2 IMPLANT
TOWEL OR 17X26 10 PK STRL BLUE (TOWEL DISPOSABLE) ×2 IMPLANT
TRAY FOLEY CATH 14FR (SET/KITS/TRAYS/PACK) ×2 IMPLANT
TUBE ANAEROBIC SPECIMEN COL (MISCELLANEOUS) ×2 IMPLANT
WATER STERILE IRR 1000ML POUR (IV SOLUTION) ×4 IMPLANT

## 2012-07-21 NOTE — Anesthesia Preprocedure Evaluation (Signed)
Anesthesia Evaluation  Patient identified by MRN, date of birth, ID band Patient awake    Reviewed: H&P , NPO status , Patient's Chart, lab work & pertinent test results  History of Anesthesia Complications Negative for: history of anesthetic complications  Airway       Dental   Pulmonary neg pulmonary ROS,  breath sounds clear to auscultation        Cardiovascular hypertension, Rhythm:Regular Rate:Normal     Neuro/Psych negative neurological ROS     GI/Hepatic Neg liver ROS, GERD-  ,  Endo/Other  diabetes  Renal/GU Renal InsufficiencyRenal disease     Musculoskeletal  (+) Arthritis -,   Abdominal   Peds  Hematology negative hematology ROS (+)   Anesthesia Other Findings   Reproductive/Obstetrics                           Anesthesia Physical Anesthesia Plan  ASA: II  Anesthesia Plan: General   Post-op Pain Management:    Induction: Intravenous  Airway Management Planned: Oral ETT  Additional Equipment:   Intra-op Plan:   Post-operative Plan: Extubation in OR  Informed Consent: I have reviewed the patients History and Physical, chart, labs and discussed the procedure including the risks, benefits and alternatives for the proposed anesthesia with the patient or authorized representative who has indicated his/her understanding and acceptance.   Dental advisory given  Plan Discussed with: CRNA and Surgeon  Anesthesia Plan Comments:         Anesthesia Quick Evaluation

## 2012-07-21 NOTE — Progress Notes (Signed)
Orthopedic Tech Progress Note Patient Details:  Greg [name] [birth year] [id number]  CPM Right Knee CPM Right Knee: On Right Knee Flexion (Degrees): 60  Right Knee Extension (Degrees): 0  Additional Comments: trapeze bar   Cammer, Theodoro Parma 07/21/2012, 11:54 AM

## 2012-07-21 NOTE — Anesthesia Postprocedure Evaluation (Signed)
  Anesthesia Post-op Note  [name]  Procedure(s) Performed: Procedure(s) (LRB) with comments: TOTAL KNEE REVISION (Right) - Right knee revision medial uni knee to cemented total knee arthroplasty  Patient Location: PACU  Anesthesia Type:General and GA combined with regional for post-op pain  Level of Consciousness: awake  Airway and Oxygen Therapy: Patient Spontanous Breathing  Post-op Pain: mild  Post-op Assessment: Post-op Vital signs reviewed  Post-op Vital Signs: stable  Complications: No apparent anesthesia complications

## 2012-07-21 NOTE — Op Note (Addendum)
Preop diagnosis: Painful loose right medial compartment hemiarthroplasty, right knee.  Postop diagnosis: Same  Procedure: Revision right total knee arthroplasty, removal of medial compartment hemiarthroplasty and conversion to total knee arthroplasty. Depue rotating platform #3 femur, #3 tibia 15 mm rotating platform polyethylene, 35 mm 3 peg patellar all poly-component  Surgeon: Rodell Perna M.D.  Assistant: Nehemiah Massed PA-C   anesthesia GLT plus preoperative femoral nerve block  EBL less than 100 cc  Tourniquet 87 minutes x350  Operative procedure after induction general anesthesia proximal thigh tourniquet for catheter insertion lateral posterior above prepping from the tourniquet to the toes with DuraPrep the usual split sheets drapes sticky impervious U drape and split sheets were applied with impervious stockinette Caban sterile skin marker on the old medial hemiarthroplasty incision and Betadine Steri-Drape applied. Timeout procedure was completed Ancef was given 2 g prophylactically.  Incision was opened with the C-shaped incision incorporating the old medial unicompartmental incision and extending it proximally and distally back to the midline. We'll 2 retinacular incision was made there was slight hemosiderin staining of the joint capsule fluid was clear and patient had previous lab work aspiration negative and no evidence of infection. Femoral component was removed the first using revision osteotomes and it came out with relative ease. Tibial component that had 2-3 mm of subsidence. Intramedullary alignment was used 4 pads rod was placed up the femur discussed her made. AP cut was made on the tibia siding inferior enough so that 2 mm her bout of bone was taken below the medial leg and knee and this cut took off about 2 mm of the tip of the keel. This was cut a small molecule that was left was taken out with a small puncture. There was good hard sclerotic bone and 15 mm distal block was  placed on the came out into full extension there was good balance the collateral ligaments. Using the revision instruments and a block in the tibia in flexion and rotation of the femur was set which gave normal piano  Sign anteriorly. Posterior cuts were made Precor curved osteotome osteotome was used to remove posterior spurs. Chamfer cuts were made off same revision block and flexion extension blocks were checked and both were 15 mm and gave excellent fit and good balance. He'll preparation for #3 in the tibia side was performed and then trials were inserted. 15 gave excellent range of motion. Blood overdrilled femur 35 mm all polyp patella was prepared removing bone from the patella Propacet facet and then during the 3 holes. Pulse lavage was used light cement was vacuum mixed. Tibia was cemented first followed by femur and insertion of the all poly-patella after row removal of excess cement. Patella was inserted cemented down as well and cement was hard at 17 minutes. Tourniquet was deflated after cement was hard. Good range of motion minimal bleeding due to the old previous medial arthrotomy for the hemiarthroplasty. Some bleeders were coagulated up in the quad tendon above the patella. Sent her layer closure with #1 Vicryl and the deep to retinaculum and quad tendon split between the medial one third one third. 203 0 Vicryl subcutaneous sutures subcuticular Vicryl closure tincture benzoin Steri-Strips postop dressing and knee immobilizer applied. Patient tolerated the procedure well transferred to her room in stable condition.                      Rodell Perna M.D.

## 2012-07-21 NOTE — Brief Op Note (Signed)
07/21/2012  9:50 AM  PATIENT:  Greg [name]  78 y.o. male  PRE-OPERATIVE DIAGNOSIS:  right knee painful medial uni knee  POST-OPERATIVE DIAGNOSIS:  right knee painful medial uni knee  PROCEDURE:  Procedure(s) (LRB) with comments: TOTAL KNEE REVISION (Right) - Right knee revision medial uni knee to cemented total knee arthroplasty  SURGEON:  Surgeon(s) and Role:    * Marybelle Killings, MD - Primary  PHYSICIAN ASSISTANT: Nehemiah Massed PA-C  ASSISTANTS:    ANESTHESIA:   regional and general  EBL:  Total I/O In: 1000 [I.V.:1000] Out: 100 [Urine:100]  BLOOD ADMINISTERED:none  DRAINS: none   LOCAL MEDICATIONS USED:  NONE  SPECIMEN:  No Specimen  DISPOSITION OF SPECIMEN:  N/A  COUNTS:  YES  TOURNIQUET:   Total Tourniquet Time Documented: Thigh (Right) - 87 minutes  DICTATION: .Viviann Spare Dictation  PLAN OF CARE: Admit to inpatient   PATIENT DISPOSITION:  PACU - hemodynamically stable.   Delay start of Pharmacological VTE agent (>24hrs) due to surgical blood loss or risk of bleeding: not applicable

## 2012-07-21 NOTE — Progress Notes (Signed)
UR COMPLETED  

## 2012-07-21 NOTE — Transfer of Care (Signed)
Immediate Anesthesia Transfer of Care Note  [name]  Procedure(s) Performed: Procedure(s) (LRB) with comments: TOTAL KNEE REVISION (Right) - Right knee revision medial uni knee to cemented total knee arthroplasty  Patient Location: PACU  Anesthesia Type:General  Level of Consciousness: awake, alert  and oriented  Airway & Oxygen Therapy: Patient Spontanous Breathing and Patient connected to nasal cannula oxygen  Post-op Assessment: Report given to PACU RN, Post -op Vital signs reviewed and stable and Patient moving all extremities  Post vital signs: Reviewed and stable  Complications: No apparent anesthesia complications

## 2012-07-21 NOTE — Preoperative (Signed)
Beta Blockers   Reason not to administer Beta Blockers:Not Applicable 

## 2012-07-21 NOTE — Interval H&P Note (Signed)
History and Physical Interval Note:  07/21/2012 7:28 AM  Greg [name]  has presented today for surgery, with the diagnosis of right knee painful medial uni knee  The various methods of treatment have been discussed with the patient and family. After consideration of risks, benefits and other options for treatment, the patient has consented to  Procedure(s) (LRB) with comments: TOTAL KNEE REVISION (Right) - Right knee revision medial uni knee to cemented total knee arthroplasty as a surgical intervention .  The patient's history has been reviewed, patient examined, no change in status, stable for surgery.  I have reviewed the patient's chart and labs.  Questions were answered to the patient's satisfaction.     Annelle Behrendt C

## 2012-07-21 NOTE — Anesthesia Procedure Notes (Addendum)
Anesthesia Regional Block:  Femoral nerve block  Pre-Anesthetic Checklist: ,, timeout performed, Correct Patient, Correct Site, Correct Laterality, Correct Procedure, Correct Position, site marked, Risks and benefits discussed,  Surgical consent,  Pre-op evaluation,  At surgeon's request and post-op pain management  Laterality: Right  Prep: chloraprep       Needles:  Injection technique: Single-shot  Needle Type: Echogenic Stimulator Needle     Needle Length: 5cm 5 cm Needle Gauge: 22 and 22 G    Additional Needles:  Procedures: nerve stimulator Femoral nerve block  Nerve Stimulator or Paresthesia:  Response: 0.48 mA,   Additional Responses:   Narrative:  Start time: 07/21/2012 7:12 AM End time: 07/21/2012 7:20 AM Injection made incrementally with aspirations every 5 mL. Anesthesiologist: Dr Chriss Driver  Additional Notes: [place] POP CHG prep, sterile tech #22 stim/echo needle with stim down to .58ma Multiple neg asp Marc .5% w/epi 25cc+decadron 4mg  infiltrated No compl Dr Chriss Driver  Femoral nerve block Procedure Name: Intubation Date/Time: 07/21/2012 7:43 AM Performed by: Sabra Heck L Pre-anesthesia Checklist: Patient identified, Timeout performed, Emergency Drugs available, Suction available and Patient being monitored Patient Re-evaluated:Patient Re-evaluated prior to inductionOxygen Delivery Method: Circle system utilized Preoxygenation: Pre-oxygenation with 100% oxygen Intubation Type: IV induction Ventilation: Mask ventilation without difficulty Laryngoscope Size: Miller and 2 Grade View: Grade I Tube type: Oral Tube size: 7.5 mm Number of attempts: 1 Airway Equipment and Method: Stylet Placement Confirmation: breath sounds checked- equal and bilateral,  ETT inserted through vocal cords under direct vision and positive ETCO2 Secured at: 21 cm Tube secured with: Tape Dental Injury: Teeth and Oropharynx as per pre-operative assessment

## 2012-07-22 LAB — GLUCOSE, CAPILLARY: Glucose-Capillary: 98 mg/dL (ref 70–99)

## 2012-07-22 LAB — CBC
MCHC: 33.8 g/dL (ref 30.0–36.0)
Platelets: 142 10*3/uL — ABNORMAL LOW (ref 150–400)
RDW: 12.5 % (ref 11.5–15.5)
WBC: 6.6 10*3/uL (ref 4.0–10.5)

## 2012-07-22 NOTE — Clinical Social Work Note (Signed)
CSW consult for SNF. D/C plan is for home with HHPT/DME, per PA note. CSW signing off as no other CSW needs identified at this time. Please re-consult if SNF needed.  Wandra Feinstein, MSW, [place] (Weekends 8:00am-4:30pm)

## 2012-07-22 NOTE — Progress Notes (Signed)
Physical Therapy Treatment Patient Details Name: Greg [name] MRN: [id number] DOB: 1932-09-07 Today's Date: 07/22/2012 Time: [id number] PT Time Calculation (min): 14 min  PT Assessment / Plan / Recommendation Comments on Treatment Session  Pt making good progress toward goals.    Follow Up Recommendations  Home health PT;Supervision/Assistance - 24 hour        Barriers to Discharge None      Equipment Recommendations  None recommended by PT       Frequency 7X/week   Plan Discharge plan remains appropriate;Frequency remains appropriate    Precautions / Restrictions Precautions Precautions: Knee Restrictions Weight Bearing Restrictions: Yes RLE Weight Bearing: Weight bearing as tolerated       Mobility  Bed Mobility Bed Mobility: Sit to Supine Supine to Sit: 6: Modified independent (Device/Increase time);HOB elevated (HOB approx 30 degrees elevated) Sit to Supine: 6: Modified independent (Device/Increase time);HOB flat Details for Bed Mobility Assistance: no cues or assistance needed. Transfers Transfers: Sit to Stand;Stand to Sit Sit to Stand: 5: Supervision;From bed;With upper extremity assist Stand to Sit: 5: Supervision;To bed;With upper extremity assist Details for Transfer Assistance: cues for hand placement with transfers for safety Ambulation/Gait Ambulation/Gait Assistance: 4: Min guard Ambulation Distance (Feet): 90 Feet Assistive device: Rolling walker Ambulation/Gait Assistance Details: min vc's for posture, to relax shoulders and to stay with walker (not push too far away with gait). Gait Pattern: Step-to pattern;Antalgic;Decreased stride length;Decreased step length - left;Decreased stance time - right Stairs: No    Exercises Total Joint Exercises Ankle Circles/Pumps: AROM;Both;10 reps;Supine Quad Sets: AROM;Strengthening;Right;10 reps;Supine (with a 5 second hold) Heel Slides: AROM;Strengthening;Right;10 reps;Supine Straight Leg Raises:  AROM;Strengthening;Right;10 reps;Supine (with a 3 second hold)   PT Diagnosis: Difficulty walking;Acute pain  PT Problem List: Decreased strength;Decreased range of motion;Decreased activity tolerance;Decreased balance;Decreased mobility;Decreased knowledge of use of DME;Decreased knowledge of precautions;Pain PT Treatment Interventions: DME instruction;Gait training;Functional mobility training;Therapeutic activities;Therapeutic exercise;Balance training;Patient/family education   PT Goals Acute Rehab PT Goals PT Goal Formulation: With patient Time For Goal Achievement: 07/29/12 Potential to Achieve Goals: Good Pt will go Supine/Side to Sit: Independently PT Goal: Supine/Side to Sit - Progress: Progressing toward goal Pt will go Sit to Supine/Side: Independently PT Goal: Sit to Supine/Side - Progress: Progressing toward goal Pt will go Sit to Stand: Independently PT Goal: Sit to Stand - Progress: Progressing toward goal Pt will go Stand to Sit: Independently PT Goal: Stand to Sit - Progress: Progressing toward goal Pt will Ambulate: >150 feet;with modified independence;with least restrictive assistive device PT Goal: Ambulate - Progress: Progressing toward goal  Visit Information  Last PT Received On: 07/22/12 Assistance Needed: +1    Subjective Data  Subjective: No new complaints, reports no pain. Patient Stated Goal: To go home with son.   Cognition  Overall Cognitive Status: Appears within functional limits for tasks assessed/performed Arousal/Alertness: Awake/alert Orientation Level: Appears intact for tasks assessed Behavior During Session: [facility] for tasks performed       End of Session PT - End of Session Equipment Utilized During Treatment: Gait belt Activity Tolerance: Patient tolerated treatment well Patient left: in bed;with call bell/phone within reach Nurse Communication: Mobility status CPM Right Knee CPM Right Knee: Off   GP     Willow Ora 07/22/2012,  2:58 PM  Willow Ora, PTA Office- [phone]
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
## 2012-07-22 NOTE — Progress Notes (Signed)
Chart reviewed and spoke with pt.  No OT needs identified.  Will sign off. Lucille Passy, OTR/L [phone]

## 2012-07-22 NOTE — Evaluation (Signed)
Physical Therapy Evaluation Patient Details Name: Greg [name] MRN: [id number] DOB: 1933-08-25 Today's Date: 07/22/2012 Time: [id number] PT Time Calculation (min): 24 min  PT Assessment / Plan / Recommendation Clinical Impression  Pt is 78 y/o male admitted for s/p right TKA revision.  Pt very motivated and willing to work.  Pt will benefit from acute PT services to improve overall mobility to prepare for safe d/c home with son.    PT Assessment  Patient needs continued PT services    Follow Up Recommendations  Home health PT;Supervision/Assistance - 24 hour    Does the patient have the potential to tolerate intense rehabilitation      Barriers to Discharge None      Equipment Recommendations  None recommended by PT    Recommendations for Other Services     Frequency 7X/week    Precautions / Restrictions Precautions Precautions: Knee Restrictions Weight Bearing Restrictions: Yes RLE Weight Bearing: Weight bearing as tolerated (chart had PWB & WBAT; MD called and gave ok WBAT)   Pertinent Vitals/Pain No c/o pain      Mobility  Bed Mobility Bed Mobility: Supine to Sit Supine to Sit: 5: Supervision Details for Bed Mobility Assistance: Supervision for safety with cues for technique Transfers Transfers: Sit to Stand;Stand to Sit Sit to Stand: 4: Min guard;From bed Stand to Sit: 4: Min guard;To chair/3-in-1 Details for Transfer Assistance: Minguard for safety with cues for hand placement.  Pt tends to keep hands on RW during transfer. Ambulation/Gait Ambulation/Gait Assistance: 4: Min guard Ambulation Distance (Feet): 80 Feet Assistive device: Rolling walker Ambulation/Gait Assistance Details: Minguard for safety with cues for step sequence.  Cues for body position within RW and RW placement.  Pt tends to ambulate too close to RW. Gait Pattern: Step-through pattern;Antalgic Stairs: No    Shoulder Instructions     Exercises Total Joint Exercises Ankle  Circles/Pumps: AROM;Both;10 reps Quad Sets: Strengthening;Right;5 reps Heel Slides: Strengthening;Right;5 reps   PT Diagnosis: Difficulty walking;Acute pain  PT Problem List: Decreased strength;Decreased range of motion;Decreased activity tolerance;Decreased balance;Decreased mobility;Decreased knowledge of use of DME;Decreased knowledge of precautions;Pain PT Treatment Interventions: DME instruction;Gait training;Functional mobility training;Therapeutic activities;Therapeutic exercise;Balance training;Patient/family education   PT Goals Acute Rehab PT Goals PT Goal Formulation: With patient Time For Goal Achievement: 07/29/12 Potential to Achieve Goals: Good Pt will go Supine/Side to Sit: Independently PT Goal: Supine/Side to Sit - Progress: Goal set today Pt will go Sit to Supine/Side: Independently PT Goal: Sit to Supine/Side - Progress: Goal set today Pt will go Sit to Stand: Independently PT Goal: Sit to Stand - Progress: Goal set today Pt will go Stand to Sit: Independently PT Goal: Stand to Sit - Progress: Goal set today Pt will Ambulate: >150 feet;with modified independence;with least restrictive assistive device PT Goal: Ambulate - Progress: Goal set today  Visit Information  Last PT Received On: 07/22/12 Assistance Needed: +1    Subjective Data  Subjective: "I'm ready to get movin." Patient Stated Goal: To go home with son.   Prior Functioning  Home Living Lives With: Son Available Help at Discharge: Family Type of Home: House Home Access: Level entry Home Layout: Two level;Full bath on main level;Able to live on main level with bedroom/bathroom Bathroom Shower/Tub: Tub/shower unit;Curtain Bathroom Toilet: Standard Bathroom Accessibility: Yes How Accessible: Accessible via walker Home Adaptive Equipment: Bedside commode/3-in-1;Walker - rolling;Shower chair with back;Hand-held shower hose Prior Function Level of Independence: Independent Able to Take Stairs?:  Yes Driving: Yes Vocation: Retired Corporate investment banker: No  difficulties Dominant Hand: Right    Cognition  Overall Cognitive Status: Appears within functional limits for tasks assessed/performed Arousal/Alertness: Awake/alert Orientation Level: Appears intact for tasks assessed Behavior During Session: [facility] for tasks performed    Extremity/Trunk Assessment Right Lower Extremity Assessment RLE ROM/Strength/Tone: Deficits;Unable to fully assess;Due to pain RLE ROM/Strength/Tone Deficits: At least 3/5 gross; Limited AAROM 0-90 degrees of flexion Left Lower Extremity Assessment LLE ROM/Strength/Tone: Within functional levels   Balance    End of Session PT - End of Session Equipment Utilized During Treatment: Gait belt Activity Tolerance: Patient tolerated treatment well Patient left: in bed;with call bell/phone within reach (Pt sitting EOB eating lunch and RN aware) Nurse Communication: Mobility status;Weight bearing status CPM Right Knee CPM Right Knee: Off  GP     Renetta Suman 07/22/2012, 12:47 PM Antoine Poche, [place] DPT [phone]

## 2012-07-23 LAB — CBC
HCT: 26.5 % — ABNORMAL LOW (ref 39.0–52.0)
MCHC: 34.3 g/dL (ref 30.0–36.0)
Platelets: 129 10*3/uL — ABNORMAL LOW (ref 150–400)
RDW: 12.6 % (ref 11.5–15.5)
WBC: 6.4 10*3/uL (ref 4.0–10.5)

## 2012-07-23 MED ORDER — OXYCODONE HCL 5 MG PO TABS: 5.0000 mg | ORAL_TABLET | ORAL | Status: DC | PRN

## 2012-07-23 MED ORDER — OXYCODONE HCL ER 10 MG PO T12A: 10.0000 mg | EXTENDED_RELEASE_TABLET | Freq: Two times a day (BID) | ORAL | Status: DC
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
## 2012-07-23 NOTE — Progress Notes (Signed)
Physical Therapy Treatment Patient Details Name: Greg [name] MRN: [id number] DOB: 03/15/1933 Today's Date: 07/23/2012 Time: 1030-1055 PT Time Calculation (min): 25 min  PT Assessment / Plan / Recommendation Comments on Treatment Session  Making excellent progress; Managing household distances well; OK fror dc home from PT standpoint    Follow Up Recommendations  Home health PT;Supervision/Assistance - 24 hour     Does the patient have the potential to tolerate intense rehabilitation     Barriers to Discharge        Equipment Recommendations  None recommended by PT    Recommendations for Other Services    Frequency 7X/week   Plan Discharge plan remains appropriate;Frequency remains appropriate    Precautions / Restrictions Precautions Precautions: Knee Restrictions RLE Weight Bearing: Weight bearing as tolerated   Pertinent Vitals/Pain Minimal pain no apparent distress     Mobility  Bed Mobility Bed Mobility: Sit to Supine Sit to Supine: 6: Modified independent (Device/Increase time);HOB flat Details for Bed Mobility Assistance: no cues or assistance needed. Bed height raised to approximate home Transfers Transfers: Sit to Stand;Stand to Sit Sit to Stand: 6: Modified independent (Device/Increase time);With upper extremity assist Stand to Sit: 6: Modified independent (Device/Increase time);To bed Details for Transfer Assistance: Smooth transition with no need for safety cues Ambulation/Gait Ambulation/Gait Assistance: 5: Supervision Ambulation Distance (Feet): 150 Feet Assistive device: Rolling walker Ambulation/Gait Assistance Details: Very steady with step-to pattern; No R knee stance instability noted; Cued pt to start working on Step-through pattern Gait velocity: somewhat slowed, especially when working on step-through gait pattern    Exercises Total Joint Exercises Quad Sets: AROM;Strengthening;Right;10 reps;Supine Short Arc Quad: AROM;Right;10  reps;Supine Heel Slides: AROM;Strengthening;Right;10 reps;Supine Straight Leg Raises: AROM;Strengthening;Right;10 reps;Supine   PT Diagnosis:    PT Problem List:   PT Treatment Interventions:     PT Goals Acute Rehab PT Goals Time For Goal Achievement: 07/29/12 Potential to Achieve Goals: Good Pt will go Supine/Side to Sit: Independently PT Goal: Supine/Side to Sit - Progress: Progressing toward goal Pt will go Sit to Supine/Side: Independently PT Goal: Sit to Supine/Side - Progress: Progressing toward goal Pt will go Sit to Stand: Independently PT Goal: Sit to Stand - Progress: Progressing toward goal Pt will go Stand to Sit: Independently PT Goal: Stand to Sit - Progress: Progressing toward goal Pt will Ambulate: >150 feet;with modified independence;with least restrictive assistive device PT Goal: Ambulate - Progress: Progressing toward goal  Visit Information  Last PT Received On: 07/23/12 Assistance Needed: +1    Subjective Data  Subjective: Minimal pain, looking forward to dc'ing today Patient Stated Goal: To go home with son.   Cognition  Overall Cognitive Status: Appears within functional limits for tasks assessed/performed Arousal/Alertness: Awake/alert Orientation Level: Appears intact for tasks assessed Behavior During Session: [facility] for tasks performed    Balance     End of Session PT - End of Session Activity Tolerance: Patient tolerated treatment well Patient left: in bed;with call bell/phone within reach Nurse Communication: Mobility status   GP     Greg [name] [name], [name]  07/23/2012, 11:11 AM

## 2012-07-24 ENCOUNTER — Encounter (HOSPITAL_COMMUNITY): Payer: Self-pay | Admitting: Orthopaedic Surgery

## 2012-07-25 ENCOUNTER — Telehealth: Payer: Self-pay | Admitting: Pulmonary Disease

## 2012-07-25 NOTE — Telephone Encounter (Signed)
I spoke with pt and he stated he had a bottle of diclofenac at home and wanted to know if he could discard this at the pharmacy since he no longer takes this and has not taken this in years. I advised him that was fine. Nothing further was needed
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
## 2012-08-21 ENCOUNTER — Telehealth: Payer: Self-pay | Admitting: Pulmonary Disease

## 2012-08-21 DIAGNOSIS — R109 Unspecified abdominal pain: Secondary | ICD-10-CM

## 2012-08-21 MED ORDER — PANTOPRAZOLE SODIUM 40 MG PO TBEC: 40.0000 mg | DELAYED_RELEASE_TABLET | Freq: Two times a day (BID) | ORAL | Status: DC

## 2012-08-21 NOTE — Telephone Encounter (Signed)
Pt aware of Dr. Jeannine Kitten recommendations. Orders will be placed for the bloodwork and ultrasound.

## 2012-08-21 NOTE — Telephone Encounter (Signed)
Per SN---will need to come in for labs---cmp, amylase, cbcd, abd ultrasound.   We will call results to the pt once they are back.  Call in protonix 40 mg  1 po bid 30 mins before breakfast and dinner.  Pt will need rov if not better with rx.  thanks

## 2012-08-21 NOTE — Telephone Encounter (Signed)
LMTCB

## 2012-08-21 NOTE — Telephone Encounter (Signed)
Pt returned call. Kathleen W Perdue  

## 2012-08-21 NOTE — Telephone Encounter (Signed)
Spoke with pt He is c/o upper abd pain x 2 wks States that the pain is dull and achy and comes and goes He also reports feeling nauseated on occ and has vomited once- this was 2 days ago I offered appt with TP for tomorrow and he refused Would like SN's recs Last ov 07/17/12 Next ov 07/17/13 Allergies  Allergen Reactions  . Lisinopril     REACTION: INTOL to all ACE's  . Valsartan     REACTION: INTOL to all ARB's

## 2012-08-24 ENCOUNTER — Ambulatory Visit (HOSPITAL_COMMUNITY)
Admission: RE | Admit: 2012-08-24 | Discharge: 2012-08-24 | Disposition: A | Discharge status: Home / Self Care | Payer: Medicare Other | Source: Ambulatory Visit | Attending: Pulmonary Disease | Admitting: Pulmonary Disease

## 2012-08-24 DIAGNOSIS — Q619 Cystic kidney disease, unspecified: Secondary | ICD-10-CM | POA: Insufficient documentation

## 2012-08-24 DIAGNOSIS — E119 Type 2 diabetes mellitus without complications: Secondary | ICD-10-CM | POA: Insufficient documentation

## 2012-08-24 DIAGNOSIS — I10 Essential (primary) hypertension: Secondary | ICD-10-CM | POA: Insufficient documentation

## 2012-08-24 DIAGNOSIS — R109 Unspecified abdominal pain: Secondary | ICD-10-CM | POA: Insufficient documentation

## 2012-08-25 ENCOUNTER — Telehealth: Payer: Self-pay | Admitting: Pulmonary Disease

## 2012-08-25 NOTE — Telephone Encounter (Signed)
There are a lot of reasons why he might be dizzy, especially since he was worked up lately for abdominal pain.  If dizziness is bad and can't wait till Monday when office opens an Dr Lenna Gilford is back, then he should go to ER.

## 2012-08-25 NOTE — Telephone Encounter (Signed)
Pt c/o dizziness wants to be seen.Greg [name]

## 2012-08-25 NOTE — Telephone Encounter (Signed)
Pt aware of CY's recs' verbally understood.nothing further was needed.

## 2012-08-25 NOTE — Telephone Encounter (Signed)
Spoke to pt and he states that he has had this dizziness since yesterday. When it happens he is up and around moving. He wants to be seen but if something can be done over the phone he is fine with that too.  Please advise. Thanks!

## 2012-08-28 ENCOUNTER — Other Ambulatory Visit (INDEPENDENT_AMBULATORY_CARE_PROVIDER_SITE_OTHER): Payer: Medicare Other

## 2012-08-28 DIAGNOSIS — R109 Unspecified abdominal pain: Secondary | ICD-10-CM

## 2012-08-28 LAB — COMPREHENSIVE METABOLIC PANEL
Alkaline Phosphatase: 60 U/L (ref 39–117)
BUN: 22 mg/dL (ref 6–23)
Glucose, Bld: 105 mg/dL — ABNORMAL HIGH (ref 70–99)
Sodium: 138 mEq/L (ref 135–145)
Total Bilirubin: 0.5 mg/dL (ref 0.3–1.2)

## 2012-08-28 LAB — AMYLASE: Amylase: 101 U/L (ref 27–131)

## 2012-08-29 ENCOUNTER — Telehealth: Payer: Self-pay | Admitting: Pulmonary Disease

## 2012-08-29 ENCOUNTER — Encounter: Payer: Self-pay | Admitting: Internal Medicine

## 2012-08-29 DIAGNOSIS — K219 Gastro-esophageal reflux disease without esophagitis: Secondary | ICD-10-CM

## 2012-08-29 LAB — CBC WITH DIFFERENTIAL/PLATELET
Eosinophils Relative: 3.6 % (ref 0.0–5.0)
HCT: 33.3 % — ABNORMAL LOW (ref 39.0–52.0)
Hemoglobin: 11.2 g/dL — ABNORMAL LOW (ref 13.0–17.0)
Lymphs Abs: 0.8 10*3/uL (ref 0.7–4.0)
Monocytes Relative: 10.8 % (ref 3.0–12.0)
Neutro Abs: 2.4 10*3/uL (ref 1.4–7.7)
WBC: 3.6 10*3/uL — ABNORMAL LOW (ref 4.5–10.5)

## 2012-08-29 MED ORDER — OMEPRAZOLE 20 MG PO CPDR: 20.0000 mg | DELAYED_RELEASE_CAPSULE | Freq: Every day | ORAL | Status: DC

## 2012-08-29 NOTE — Telephone Encounter (Signed)
Notes Recorded by Noralee Space, MD on 08/28/2012 at 8:48 AM Please notify patient>  Abd sonar is neg- no gallstones or liver dis... He never did the requested blood work! Rec- start Prilosec20Bid for now & f/u w/ DrPerry for GI... ------  Spoke with patient, patient aware of results/recs as listed below per Dr. Lenna Gilford.  Patient states he had labs drawn 08/18/12 which do not appear to have resulted back at this time.  Patient aware once they have we will call him w them.  Medication ordered to [facility] on [address]. Ref for Dr. Henrene Pastor placed and nothing further needed.

## 2012-08-29 NOTE — Progress Notes (Signed)
Quick Note:  Called and spoke with patient, patient aware of results/recs as listed below per Dr. Lenna Gilford. Medication ordered, referral made. Patient states he had labs collected 08/28/12, but these Do not appear to have resulted back at this time. Pt aware once they have our office will call h8im. Pt verbalized understanding of this and nothing further needed at this time. ______

## 2012-09-19 ENCOUNTER — Ambulatory Visit (INDEPENDENT_AMBULATORY_CARE_PROVIDER_SITE_OTHER): Payer: Medicare Other | Admitting: Gastroenterology

## 2012-09-19 ENCOUNTER — Ambulatory Visit: Payer: Medicare Other | Admitting: Physician Assistant

## 2012-09-19 ENCOUNTER — Encounter: Payer: Self-pay | Admitting: Gastroenterology

## 2012-09-19 ENCOUNTER — Ambulatory Visit: Payer: Medicare Other | Admitting: Internal Medicine

## 2012-09-19 VITALS — BP 130/80 | HR 78 | Ht 69.0 in | Wt 168.2 lb

## 2012-09-19 DIAGNOSIS — R110 Nausea: Secondary | ICD-10-CM

## 2012-09-19 NOTE — Progress Notes (Signed)
Agree with assessment and plan 

## 2012-09-19 NOTE — Patient Instructions (Addendum)
Please call us back if the nausea returns.  Thank you for choosing me and [facility].   Alonza Bogus, PA-C

## 2012-09-19 NOTE — Progress Notes (Signed)
I agree with A and P...drp
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
## 2012-11-07 ENCOUNTER — Ambulatory Visit (INDEPENDENT_AMBULATORY_CARE_PROVIDER_SITE_OTHER): Payer: Medicare Other | Admitting: Ophthalmology

## 2012-11-07 DIAGNOSIS — I10 Essential (primary) hypertension: Secondary | ICD-10-CM

## 2012-11-21 ENCOUNTER — Telehealth: Payer: Self-pay | Admitting: Pulmonary Disease

## 2012-11-21 MED ORDER — LABETALOL HCL 200 MG PO TABS: ORAL_TABLET | ORAL | Status: DC

## 2012-11-21 NOTE — Telephone Encounter (Signed)
Per SN---  recs to increase the labetalol 200 mg   To 2 po bid   #120 and pt will need an ov with SN in 1 month on this new dose. Called and spoke with pt and he is aware and will call back to scheduled a follow up. Nothing further is needed.

## 2012-11-21 NOTE — Telephone Encounter (Signed)
I spoke with pt and he stated his BP today top # was 180. He does not remember bottom. Pt currently takes labetolol 200 mg BID. Pt has not been been checking his BP. Only reaosn this was checked was bc he went to the doctor today. Please advise SN thanks  Last OV 07/17/12 Pending 07/17/13

## 2013-01-27 ENCOUNTER — Other Ambulatory Visit: Payer: Self-pay | Admitting: Pulmonary Disease

## 2013-02-11 ENCOUNTER — Other Ambulatory Visit: Payer: Self-pay | Admitting: Pulmonary Disease

## 2013-03-06 ENCOUNTER — Encounter (HOSPITAL_COMMUNITY): Payer: Self-pay | Admitting: Pharmacy Technician

## 2013-03-08 ENCOUNTER — Other Ambulatory Visit (HOSPITAL_COMMUNITY): Payer: Self-pay | Admitting: Orthopaedic Surgery

## 2013-03-08 NOTE — Pre-Procedure Instructions (Signed)
Greg [name]  03/08/2013   Your procedure is scheduled on:  Mon, July 21 @ 12:00 PM  Report to [facility] at 10:00 AM.  Call this number if you have problems the morning of surgery: [phone]   Remember:   Do not eat food or drink liquids after midnight.   Take these medicines the morning of surgery with A SIP OF WATER: Flomax(Tamsulosin) and Labetalol(Normodyne)              Stop taking your Diclofenac and Aspirin.No Goody's,BC's,Aleve,Ibuprofen,Fish Oil,or any Herbal Medications   Do not wear jewelry  Do not wear lotions, powders, orcolognes. You may wear deodorant.  Men may shave face and neck.  Do not bring valuables to the hospital.  [facility] is not responsible                   for any belongings or valuables.  Contacts, dentures or bridgework may not be worn into surgery.  Leave suitcase in the car. After surgery it may be brought to your room.  For patients admitted to the hospital, checkout time is 11:00 AM the day of  discharge.     Special Instructions: Shower using CHG 2 nights before surgery and the night before surgery.  If you shower the day of surgery use CHG.  Use special wash - you have one bottle of CHG for all showers.  You should use approximately 1/3 of the bottle for each shower.   Please read over the following fact sheets that you were given: Pain Booklet, Coughing and Deep Breathing, Blood Transfusion Information, MRSA Information and Surgical Site Infection Prevention

## 2013-03-09 ENCOUNTER — Encounter (HOSPITAL_COMMUNITY)
Admission: RE | Admit: 2013-03-09 | Discharge: 2013-03-09 | Disposition: A | Discharge status: Home / Self Care | Payer: Medicare Other | Source: Ambulatory Visit | Attending: Orthopaedic Surgery | Admitting: Orthopaedic Surgery

## 2013-03-09 ENCOUNTER — Encounter (HOSPITAL_COMMUNITY): Payer: Self-pay

## 2013-03-09 DIAGNOSIS — Z01812 Encounter for preprocedural laboratory examination: Secondary | ICD-10-CM | POA: Insufficient documentation

## 2013-03-09 DIAGNOSIS — Z01818 Encounter for other preprocedural examination: Secondary | ICD-10-CM | POA: Insufficient documentation

## 2013-03-09 HISTORY — DX: Pain in unspecified joint: M25.50

## 2013-03-09 HISTORY — DX: Personal history of colon polyps, unspecified: Z86.0100

## 2013-03-09 HISTORY — DX: Personal history of colonic polyps: Z86.010

## 2013-03-09 HISTORY — DX: Benign prostatic hyperplasia without lower urinary tract symptoms: N40.0

## 2013-03-09 HISTORY — DX: Nocturia: R35.1

## 2013-03-09 LAB — COMPREHENSIVE METABOLIC PANEL
ALT: 20 U/L (ref 0–53)
BUN: 23 mg/dL (ref 6–23)
CO2: 27 mEq/L (ref 19–32)
Calcium: 9.9 mg/dL (ref 8.4–10.5)
GFR calc Af Amer: 34 mL/min — ABNORMAL LOW (ref >90)
GFR calc non Af Amer: 29 mL/min — ABNORMAL LOW (ref >90)
Glucose, Bld: 107 mg/dL — ABNORMAL HIGH (ref 70–99)
Sodium: 142 mEq/L (ref 135–145)
Total Protein: 6.9 g/dL (ref 6.0–8.3)

## 2013-03-09 LAB — TYPE AND SCREEN
ABO/RH(D): A POS
Antibody Screen: NEGATIVE

## 2013-03-09 LAB — URINALYSIS, ROUTINE W REFLEX MICROSCOPIC
Glucose, UA: NEGATIVE mg/dL
Hgb urine dipstick: NEGATIVE
Ketones, ur: NEGATIVE mg/dL
Protein, ur: NEGATIVE mg/dL
Urobilinogen, UA: 0.2 mg/dL (ref 0.0–1.0)

## 2013-03-09 LAB — CBC
HCT: 40.3 % (ref 39.0–52.0)
MCHC: 34.5 g/dL (ref 30.0–36.0)
MCV: 97.1 fL (ref 78.0–100.0)
Platelets: 158 10*3/uL (ref 150–400)
RDW: 12.8 % (ref 11.5–15.5)

## 2013-03-09 LAB — PROTIME-INR: Prothrombin Time: 12.1 seconds (ref 11.6–15.2)

## 2013-03-09 NOTE — Progress Notes (Signed)
Pt doesn't have a cardiologist  EKG and CXR in epic from 07-11-12  Denies ever having an echo/stress test/heart cath  Medical Md is Dr.Scott Lenna Gilford
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
## 2013-03-16 NOTE — Progress Notes (Signed)
Message left with pt re: time change and to arrive at 1030 on Monday.  Instructed pt to call when received this message.
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
## 2013-03-18 MED ORDER — CEFAZOLIN SODIUM-DEXTROSE 2-3 GM-% IV SOLR
2.0000 g | INTRAVENOUS | Status: AC
  Administered 2013-03-19: 2 g via INTRAVENOUS
  Filled 2013-03-18: qty 50

## 2013-03-19 ENCOUNTER — Encounter (HOSPITAL_COMMUNITY): Payer: Self-pay | Admitting: *Deleted

## 2013-03-19 ENCOUNTER — Encounter (HOSPITAL_COMMUNITY): Payer: Self-pay | Admitting: Vascular Surgery

## 2013-03-19 ENCOUNTER — Inpatient Hospital Stay (HOSPITAL_COMMUNITY)
Admission: RE | Admit: 2013-03-19 | Discharge: 2013-03-21 | DRG: 470 | Disposition: A | Discharge status: Home Health | Payer: Medicare Other | Source: Ambulatory Visit | Attending: Orthopaedic Surgery | Admitting: Orthopaedic Surgery

## 2013-03-19 ENCOUNTER — Inpatient Hospital Stay (HOSPITAL_COMMUNITY): Payer: Medicare Other | Admitting: Anesthesiology

## 2013-03-19 ENCOUNTER — Encounter (HOSPITAL_COMMUNITY)
Admission: RE | Disposition: A | Discharge status: Home Health | Payer: Self-pay | Source: Ambulatory Visit | Attending: Orthopaedic Surgery

## 2013-03-19 DIAGNOSIS — F411 Generalized anxiety disorder: Secondary | ICD-10-CM | POA: Diagnosis present

## 2013-03-19 DIAGNOSIS — H409 Unspecified glaucoma: Secondary | ICD-10-CM | POA: Diagnosis present

## 2013-03-19 DIAGNOSIS — M1712 Unilateral primary osteoarthritis, left knee: Secondary | ICD-10-CM

## 2013-03-19 DIAGNOSIS — M1710 Unilateral primary osteoarthritis, unspecified knee: Principal | ICD-10-CM | POA: Diagnosis present

## 2013-03-19 DIAGNOSIS — N138 Other obstructive and reflux uropathy: Secondary | ICD-10-CM | POA: Diagnosis present

## 2013-03-19 DIAGNOSIS — K219 Gastro-esophageal reflux disease without esophagitis: Secondary | ICD-10-CM | POA: Diagnosis present

## 2013-03-19 DIAGNOSIS — Z96659 Presence of unspecified artificial knee joint: Secondary | ICD-10-CM

## 2013-03-19 DIAGNOSIS — Z860100 Personal history of colon polyps, unspecified: Secondary | ICD-10-CM

## 2013-03-19 DIAGNOSIS — H5440 Blindness, one eye, unspecified eye: Secondary | ICD-10-CM | POA: Diagnosis present

## 2013-03-19 DIAGNOSIS — Z87891 Personal history of nicotine dependence: Secondary | ICD-10-CM

## 2013-03-19 DIAGNOSIS — E7800 Pure hypercholesterolemia, unspecified: Secondary | ICD-10-CM | POA: Diagnosis present

## 2013-03-19 DIAGNOSIS — I10 Essential (primary) hypertension: Secondary | ICD-10-CM | POA: Diagnosis present

## 2013-03-19 DIAGNOSIS — N139 Obstructive and reflux uropathy, unspecified: Secondary | ICD-10-CM | POA: Diagnosis present

## 2013-03-19 DIAGNOSIS — N401 Enlarged prostate with lower urinary tract symptoms: Secondary | ICD-10-CM | POA: Diagnosis present

## 2013-03-19 DIAGNOSIS — Z7982 Long term (current) use of aspirin: Secondary | ICD-10-CM

## 2013-03-19 DIAGNOSIS — Z79899 Other long term (current) drug therapy: Secondary | ICD-10-CM

## 2013-03-19 DIAGNOSIS — E119 Type 2 diabetes mellitus without complications: Secondary | ICD-10-CM | POA: Diagnosis present

## 2013-03-19 HISTORY — PX: KNEE ARTHROPLASTY: SHX992

## 2013-03-19 LAB — GLUCOSE, CAPILLARY: Glucose-Capillary: 101 mg/dL — ABNORMAL HIGH (ref 70–99)

## 2013-03-19 SURGERY — ARTHROPLASTY, KNEE, TOTAL, USING IMAGELESS COMPUTER-ASSISTED NAVIGATION
Anesthesia: General | Site: Knee | Laterality: Left | Wound class: Clean

## 2013-03-19 MED ORDER — ASPIRIN EC 325 MG PO TBEC
325.0000 mg | DELAYED_RELEASE_TABLET | Freq: Every day | ORAL | Status: DC
  Administered 2013-03-20 – 2013-03-21 (×2): 325 mg via ORAL
  Filled 2013-03-19 (×3): qty 1

## 2013-03-19 MED ORDER — KETOROLAC TROMETHAMINE 15 MG/ML IJ SOLN
7.5000 mg | Freq: Three times a day (TID) | INTRAMUSCULAR | Status: AC
  Administered 2013-03-19 – 2013-03-20 (×3): 7.5 mg via INTRAVENOUS
  Filled 2013-03-19 (×3): qty 1

## 2013-03-19 MED ORDER — DEXTROSE 5 % IV SOLN
INTRAVENOUS | Status: DC | PRN
  Administered 2013-03-19: 13:00:00 via INTRAVENOUS

## 2013-03-19 MED ORDER — OXYCODONE HCL 5 MG PO TABS
5.0000 mg | ORAL_TABLET | ORAL | Status: DC | PRN
  Administered 2013-03-20 – 2013-03-21 (×3): 10 mg via ORAL
  Filled 2013-03-19 (×4): qty 2

## 2013-03-19 MED ORDER — ASPIRIN 325 MG PO TABS: 325.0000 mg | ORAL_TABLET | Freq: Every day | ORAL | Status: DC

## 2013-03-19 MED ORDER — ONDANSETRON HCL 4 MG PO TABS: 4.0000 mg | ORAL_TABLET | Freq: Four times a day (QID) | ORAL | Status: DC | PRN

## 2013-03-19 MED ORDER — CEFAZOLIN SODIUM 1-5 GM-% IV SOLN
1.0000 g | Freq: Four times a day (QID) | INTRAVENOUS | Status: AC
  Administered 2013-03-19 (×2): 1 g via INTRAVENOUS
  Filled 2013-03-19 (×2): qty 50

## 2013-03-19 MED ORDER — OXYCODONE HCL 5 MG/5ML PO SOLN: 5.0000 mg | Freq: Once | ORAL | Status: DC | PRN

## 2013-03-19 MED ORDER — ZOLPIDEM TARTRATE 5 MG PO TABS: 5.0000 mg | ORAL_TABLET | Freq: Every evening | ORAL | Status: DC | PRN

## 2013-03-19 MED ORDER — ACETAMINOPHEN 650 MG RE SUPP: 650.0000 mg | Freq: Four times a day (QID) | RECTAL | Status: DC | PRN

## 2013-03-19 MED ORDER — ONDANSETRON HCL 4 MG/2ML IJ SOLN: 4.0000 mg | Freq: Once | INTRAMUSCULAR | Status: DC | PRN

## 2013-03-19 MED ORDER — LACTATED RINGERS IV SOLN
INTRAVENOUS | Status: DC
  Administered 2013-03-19: 11:00:00 via INTRAVENOUS

## 2013-03-19 MED ORDER — ACETAMINOPHEN 325 MG PO TABS: 650.0000 mg | ORAL_TABLET | Freq: Four times a day (QID) | ORAL | Status: DC | PRN

## 2013-03-19 MED ORDER — MENTHOL 3 MG MT LOZG: 1.0000 | LOZENGE | OROMUCOSAL | Status: DC | PRN

## 2013-03-19 MED ORDER — LABETALOL HCL 200 MG PO TABS
200.0000 mg | ORAL_TABLET | Freq: Two times a day (BID) | ORAL | Status: DC
  Administered 2013-03-19 – 2013-03-20 (×3): 200 mg via ORAL
  Filled 2013-03-19 (×6): qty 1

## 2013-03-19 MED ORDER — MIDAZOLAM HCL 5 MG/ML IJ SOLN
1.0000 mg | Freq: Once | INTRAMUSCULAR | Status: AC
  Administered 2013-03-19: 1 mg via INTRAVENOUS

## 2013-03-19 MED ORDER — DICLOFENAC SODIUM 75 MG PO TBEC
75.0000 mg | DELAYED_RELEASE_TABLET | Freq: Two times a day (BID) | ORAL | Status: DC | PRN
  Filled 2013-03-19: qty 1

## 2013-03-19 MED ORDER — ARTIFICIAL TEARS OP OINT
TOPICAL_OINTMENT | OPHTHALMIC | Status: DC | PRN
  Administered 2013-03-19: 1 via OPHTHALMIC

## 2013-03-19 MED ORDER — METOCLOPRAMIDE HCL 5 MG/ML IJ SOLN: 5.0000 mg | Freq: Three times a day (TID) | INTRAMUSCULAR | Status: DC | PRN

## 2013-03-19 MED ORDER — SIMVASTATIN 40 MG PO TABS
40.0000 mg | ORAL_TABLET | Freq: Every day | ORAL | Status: DC
  Administered 2013-03-19 – 2013-03-20 (×2): 40 mg via ORAL
  Filled 2013-03-19 (×3): qty 1

## 2013-03-19 MED ORDER — METHOCARBAMOL 100 MG/ML IJ SOLN
500.0000 mg | Freq: Four times a day (QID) | INTRAVENOUS | Status: DC | PRN
  Filled 2013-03-19: qty 5

## 2013-03-19 MED ORDER — OXYCODONE-ACETAMINOPHEN 5-325 MG PO TABS: 1.0000 | ORAL_TABLET | ORAL | Status: DC | PRN

## 2013-03-19 MED ORDER — NEOSTIGMINE METHYLSULFATE 1 MG/ML IJ SOLN
INTRAMUSCULAR | Status: DC | PRN
  Administered 2013-03-19: 4 mg via INTRAVENOUS

## 2013-03-19 MED ORDER — PHENOL 1.4 % MT LIQD: 1.0000 | OROMUCOSAL | Status: DC | PRN

## 2013-03-19 MED ORDER — METOCLOPRAMIDE HCL 10 MG PO TABS: 5.0000 mg | ORAL_TABLET | Freq: Three times a day (TID) | ORAL | Status: DC | PRN

## 2013-03-19 MED ORDER — FENTANYL CITRATE 0.05 MG/ML IJ SOLN
INTRAMUSCULAR | Status: AC
  Filled 2013-03-19: qty 2

## 2013-03-19 MED ORDER — SENNOSIDES-DOCUSATE SODIUM 8.6-50 MG PO TABS: 1.0000 | ORAL_TABLET | Freq: Every evening | ORAL | Status: DC | PRN

## 2013-03-19 MED ORDER — LACTATED RINGERS IV SOLN
INTRAVENOUS | Status: DC | PRN
  Administered 2013-03-19 (×2): via INTRAVENOUS

## 2013-03-19 MED ORDER — KCL IN DEXTROSE-NACL 20-5-0.45 MEQ/L-%-% IV SOLN
INTRAVENOUS | Status: DC
  Administered 2013-03-19 – 2013-03-20 (×2): via INTRAVENOUS
  Filled 2013-03-19 (×5): qty 1000

## 2013-03-19 MED ORDER — LIDOCAINE HCL (CARDIAC) 20 MG/ML IV SOLN
INTRAVENOUS | Status: DC | PRN
  Administered 2013-03-19: 80 mg via INTRAVENOUS

## 2013-03-19 MED ORDER — MIDAZOLAM HCL 2 MG/2ML IJ SOLN
INTRAMUSCULAR | Status: AC
  Administered 2013-03-19: 1 mg
  Filled 2013-03-19: qty 2

## 2013-03-19 MED ORDER — FENTANYL CITRATE 0.05 MG/ML IJ SOLN
INTRAMUSCULAR | Status: DC | PRN
  Administered 2013-03-19: 50 ug via INTRAVENOUS
  Administered 2013-03-19: 100 ug via INTRAVENOUS

## 2013-03-19 MED ORDER — MORPHINE SULFATE 2 MG/ML IJ SOLN: 1.0000 mg | INTRAMUSCULAR | Status: DC | PRN

## 2013-03-19 MED ORDER — METHOCARBAMOL 500 MG PO TABS
500.0000 mg | ORAL_TABLET | Freq: Four times a day (QID) | ORAL | Status: DC | PRN
  Administered 2013-03-20 (×2): 500 mg via ORAL
  Filled 2013-03-19 (×2): qty 1

## 2013-03-19 MED ORDER — FLEET ENEMA 7-19 GM/118ML RE ENEM: 1.0000 | ENEMA | Freq: Once | RECTAL | Status: AC | PRN

## 2013-03-19 MED ORDER — BISACODYL 10 MG RE SUPP: 10.0000 mg | Freq: Every day | RECTAL | Status: DC | PRN

## 2013-03-19 MED ORDER — DOCUSATE SODIUM 100 MG PO CAPS
100.0000 mg | ORAL_CAPSULE | Freq: Two times a day (BID) | ORAL | Status: DC
  Administered 2013-03-19 – 2013-03-20 (×3): 100 mg via ORAL
  Filled 2013-03-19 (×4): qty 1

## 2013-03-19 MED ORDER — TAMSULOSIN HCL 0.4 MG PO CAPS
0.4000 mg | ORAL_CAPSULE | Freq: Every day | ORAL | Status: DC
  Administered 2013-03-20: 0.4 mg via ORAL
  Filled 2013-03-19 (×3): qty 1

## 2013-03-19 MED ORDER — ONDANSETRON HCL 4 MG/2ML IJ SOLN: 4.0000 mg | Freq: Four times a day (QID) | INTRAMUSCULAR | Status: DC | PRN

## 2013-03-19 MED ORDER — METHOCARBAMOL 500 MG PO TABS: 500.0000 mg | ORAL_TABLET | Freq: Four times a day (QID) | ORAL | Status: DC | PRN

## 2013-03-19 MED ORDER — ROCURONIUM BROMIDE 100 MG/10ML IV SOLN
INTRAVENOUS | Status: DC | PRN
  Administered 2013-03-19: 50 mg via INTRAVENOUS

## 2013-03-19 MED ORDER — HYDROMORPHONE HCL PF 1 MG/ML IJ SOLN: 0.2500 mg | INTRAMUSCULAR | Status: DC | PRN

## 2013-03-19 MED ORDER — FENTANYL CITRATE 0.05 MG/ML IJ SOLN
100.0000 ug | Freq: Once | INTRAMUSCULAR | Status: AC
  Administered 2013-03-19: 100 ug via INTRAVENOUS

## 2013-03-19 MED ORDER — PROPOFOL 10 MG/ML IV BOLUS
INTRAVENOUS | Status: DC | PRN
  Administered 2013-03-19: 150 mg via INTRAVENOUS

## 2013-03-19 MED ORDER — SODIUM CHLORIDE 0.9 % IR SOLN
Status: DC | PRN
  Administered 2013-03-19: 3000 mL
  Administered 2013-03-19: 1000 mL

## 2013-03-19 MED ORDER — DIPHENHYDRAMINE HCL 12.5 MG/5ML PO ELIX: 12.5000 mg | ORAL_SOLUTION | ORAL | Status: DC | PRN

## 2013-03-19 MED ORDER — GLYCOPYRROLATE 0.2 MG/ML IJ SOLN
INTRAMUSCULAR | Status: DC | PRN
  Administered 2013-03-19: 0.6 mg via INTRAVENOUS

## 2013-03-19 MED ORDER — ONDANSETRON HCL 4 MG/2ML IJ SOLN
INTRAMUSCULAR | Status: DC | PRN
  Administered 2013-03-19: 4 mg via INTRAVENOUS

## 2013-03-19 MED ORDER — ADULT MULTIVITAMIN W/MINERALS CH
1.0000 | ORAL_TABLET | Freq: Every day | ORAL | Status: DC
  Administered 2013-03-19 – 2013-03-20 (×2): 1 via ORAL
  Filled 2013-03-19 (×3): qty 1

## 2013-03-19 MED ORDER — OXYCODONE HCL 5 MG PO TABS: 5.0000 mg | ORAL_TABLET | Freq: Once | ORAL | Status: DC | PRN

## 2013-03-19 SURGICAL SUPPLY — 72 items
APL SKNCLS STERI-STRIP NONHPOA (GAUZE/BANDAGES/DRESSINGS) ×1
BANDAGE ELASTIC 4 VELCRO ST LF (GAUZE/BANDAGES/DRESSINGS) ×2 IMPLANT
BANDAGE ESMARK 6X9 LF (GAUZE/BANDAGES/DRESSINGS) ×1 IMPLANT
BENZOIN TINCTURE PRP APPL 2/3 (GAUZE/BANDAGES/DRESSINGS) ×2 IMPLANT
BLADE SAGITTAL 25.0X1.19X90 (BLADE) ×2 IMPLANT
BLADE SAW SGTL 13X75X1.27 (BLADE) ×2 IMPLANT
BNDG CMPR 9X6 STRL LF SNTH (GAUZE/BANDAGES/DRESSINGS) ×1
BNDG ELASTIC 6X10 VLCR STRL LF (GAUZE/BANDAGES/DRESSINGS) ×2 IMPLANT
BNDG ESMARK 6X9 LF (GAUZE/BANDAGES/DRESSINGS) ×2
BOWL SMART MIX CTS (DISPOSABLE) ×2 IMPLANT
CAPT RP KNEE ×2 IMPLANT
CEMENT HV SMART SET (Cement) ×4 IMPLANT
CLOTH BEACON ORANGE TIMEOUT ST (SAFETY) ×2 IMPLANT
CLSR STERI-STRIP ANTIMIC 1/2X4 (GAUZE/BANDAGES/DRESSINGS) ×2 IMPLANT
COVER BACK TABLE 24X17X13 BIG (DRAPES) IMPLANT
COVER SURGICAL LIGHT HANDLE (MISCELLANEOUS) ×2 IMPLANT
CUFF TOURNIQUET SINGLE 34IN LL (TOURNIQUET CUFF) ×2 IMPLANT
CUFF TOURNIQUET SINGLE 44IN (TOURNIQUET CUFF) IMPLANT
DRAPE ORTHO SPLIT 77X108 STRL (DRAPES) ×4
DRAPE SURG ORHT 6 SPLT 77X108 (DRAPES) ×2 IMPLANT
DRAPE U-SHAPE 47X51 STRL (DRAPES) ×2 IMPLANT
DRSG PAD ABDOMINAL 8X10 ST (GAUZE/BANDAGES/DRESSINGS) ×4 IMPLANT
DURAPREP 26ML APPLICATOR (WOUND CARE) ×4 IMPLANT
ELECT REM PT RETURN 9FT ADLT (ELECTROSURGICAL) ×2
ELECTRODE REM PT RTRN 9FT ADLT (ELECTROSURGICAL) ×1 IMPLANT
FACESHIELD LNG OPTICON STERILE (SAFETY) ×6 IMPLANT
GAUZE XEROFORM 5X9 LF (GAUZE/BANDAGES/DRESSINGS) ×2 IMPLANT
GLOVE BIO SURGEON STRL SZ8.5 (GLOVE) ×2 IMPLANT
GLOVE BIOGEL M SZ8.5 STRL (GLOVE) ×2 IMPLANT
GLOVE BIOGEL PI IND STRL 7.5 (GLOVE) ×1 IMPLANT
GLOVE BIOGEL PI IND STRL 8 (GLOVE) ×1 IMPLANT
GLOVE BIOGEL PI INDICATOR 7.5 (GLOVE) ×1
GLOVE BIOGEL PI INDICATOR 8 (GLOVE) ×1
GLOVE BIOGEL PI ORTHO PRO SZ7 (GLOVE) ×1
GLOVE ECLIPSE 7.0 STRL STRAW (GLOVE) ×2 IMPLANT
GLOVE ORTHO TXT STRL SZ7.5 (GLOVE) ×2 IMPLANT
GLOVE PI ORTHO PRO STRL SZ7 (GLOVE) ×1 IMPLANT
GLOVE SURG SS PI 7.0 STRL IVOR (GLOVE) ×2 IMPLANT
GOWN PREVENTION PLUS LG XLONG (DISPOSABLE) IMPLANT
GOWN PREVENTION PLUS XLARGE (GOWN DISPOSABLE) ×4 IMPLANT
GOWN STRL NON-REIN LRG LVL3 (GOWN DISPOSABLE) IMPLANT
GOWN STRL REIN XL XLG (GOWN DISPOSABLE) ×6 IMPLANT
HANDPIECE INTERPULSE COAX TIP (DISPOSABLE) ×1
IMMOBILIZER KNEE 22 UNIV (SOFTGOODS) ×2 IMPLANT
KIT BASIN OR (CUSTOM PROCEDURE TRAY) ×2 IMPLANT
KIT ROOM TURNOVER OR (KITS) ×2 IMPLANT
MANIFOLD NEPTUNE II (INSTRUMENTS) ×2 IMPLANT
MARKER SPHERE PSV REFLC THRD 5 (MARKER) ×6 IMPLANT
NEEDLE 1/2 CIR MAYO (NEEDLE) IMPLANT
NEEDLE HYPO 25GX1X1/2 BEV (NEEDLE) ×2 IMPLANT
NS IRRIG 1000ML POUR BTL (IV SOLUTION) ×2 IMPLANT
PACK TOTAL JOINT (CUSTOM PROCEDURE TRAY) ×2 IMPLANT
PAD ARMBOARD 7.5X6 YLW CONV (MISCELLANEOUS) ×4 IMPLANT
PAD CAST 4YDX4 CTTN HI CHSV (CAST SUPPLIES) ×1 IMPLANT
PADDING CAST COTTON 4X4 STRL (CAST SUPPLIES) ×1
PADDING CAST COTTON 6X4 STRL (CAST SUPPLIES) ×2 IMPLANT
PIN SCHANZ 4MM 130MM (PIN) ×8 IMPLANT
SET HNDPC FAN SPRY TIP SCT (DISPOSABLE) ×1 IMPLANT
SPONGE GAUZE 4X4 12PLY (GAUZE/BANDAGES/DRESSINGS) ×2 IMPLANT
STAPLER VISISTAT 35W (STAPLE) ×2 IMPLANT
STRIP CLOSURE SKIN 1/2X4 (GAUZE/BANDAGES/DRESSINGS) ×4 IMPLANT
SUCTION FRAZIER TIP 10 FR DISP (SUCTIONS) ×2 IMPLANT
SUT ETHIBOND NAB CT1 #1 30IN (SUTURE) ×2 IMPLANT
SUT VIC AB 2-0 CT1 27 (SUTURE) ×4
SUT VIC AB 2-0 CT1 TAPERPNT 27 (SUTURE) ×2 IMPLANT
SUT VICRYL 4-0 PS2 18IN ABS (SUTURE) ×2 IMPLANT
SUT VICRYL AB 2 0 TIES (SUTURE) ×2 IMPLANT
SYR CONTROL 10ML LL (SYRINGE) ×2 IMPLANT
TOWEL OR 17X24 6PK STRL BLUE (TOWEL DISPOSABLE) ×2 IMPLANT
TOWEL OR 17X26 10 PK STRL BLUE (TOWEL DISPOSABLE) ×2 IMPLANT
TRAY FOLEY CATH 16FRSI W/METER (SET/KITS/TRAYS/PACK) ×2 IMPLANT
WATER STERILE IRR 1000ML POUR (IV SOLUTION) ×4 IMPLANT

## 2013-03-19 NOTE — Anesthesia Procedure Notes (Signed)
Procedure Name: Intubation Date/Time: 03/19/2013 12:36 PM Performed by: Terrill Mohr Pre-anesthesia Checklist: Patient identified, Emergency Drugs available, Suction available and Patient being monitored Patient Re-evaluated:Patient Re-evaluated prior to inductionOxygen Delivery Method: Circle system utilized Preoxygenation: Pre-oxygenation with 100% oxygen Intubation Type: IV induction Ventilation: Mask ventilation without difficulty Laryngoscope Size: Mac and 3 Grade View: Grade II Tube type: Oral Tube size: 7.5 mm Number of attempts: 1 Airway Equipment and Method: Stylet Placement Confirmation: ETT inserted through vocal cords under direct vision,  breath sounds checked- equal and bilateral and positive ETCO2 Secured at: 21 (cm at teeth) cm Tube secured with: Tape Dental Injury: Teeth and Oropharynx as per pre-operative assessment

## 2013-03-19 NOTE — Brief Op Note (Cosign Needed)
03/19/2013  2:55 PM  PATIENT:  Greg [name]  79 y.o. male  PRE-OPERATIVE DIAGNOSIS:  Left Knee Osteoarthritis   POST-OPERATIVE DIAGNOSIS:  Left Knee Osteoarthritis   PROCEDURE:  Procedure(s) with comments: COMPUTER ASSISTED TOTAL KNEE ARTHROPLASTY- left (Left) - Left Total Knee Arthroplasty, computer assist, cemented  SURGEON:  Surgeon(s) and Role:    * Marybelle Killings, MD - Primary  PHYSICIAN ASSISTANT: Phillips Hay PAC  ASSISTANTS: none   ANESTHESIA:   general  EBL:  Total I/O In: 1050 [I.V.:1050] Out: 350 [Urine:350]  BLOOD ADMINISTERED:none  DRAINS: none   LOCAL MEDICATIONS USED: none  SPECIMEN:  No Specimen  DISPOSITION OF SPECIMEN:  N/A  COUNTS:  YES  TOURNIQUET:   Total Tourniquet Time Documented: Thigh (Left) - 65 minutes Total: Thigh (Left) - 65 minutes   DICTATION: .Note written in EPIC  PLAN OF CARE: Admit to inpatient   PATIENT DISPOSITION:  PACU - hemodynamically stable.   Delay start of Pharmacological VTE agent (>24hrs) due to surgical blood loss or risk of bleeding: no

## 2013-03-19 NOTE — Anesthesia Preprocedure Evaluation (Addendum)
Anesthesia Evaluation  Patient identified by MRN, date of birth, ID band Patient awake    Reviewed: Allergy & Precautions, H&P , NPO status , Patient's Chart, lab work & pertinent test results, reviewed documented beta blocker date and time   Airway Mallampati: I TM Distance: >3 FB Neck ROM: Full    Dental  (+) Teeth Intact, Dental Advisory Given and Missing   Pulmonary  breath sounds clear to auscultation        Cardiovascular hypertension, Pt. on medications Rhythm:Regular Rate:Normal     Neuro/Psych    GI/Hepatic   Endo/Other    Renal/GU      Musculoskeletal   Abdominal   Peds  Hematology   Anesthesia Other Findings   Reproductive/Obstetrics                          Anesthesia Physical Anesthesia Plan  ASA: II  Anesthesia Plan: General   Post-op Pain Management:    Induction: Intravenous  Airway Management Planned: Oral ETT  Additional Equipment:   Intra-op Plan:   Post-operative Plan: Extubation in OR  Informed Consent: I have reviewed the patients History and Physical, chart, labs and discussed the procedure including the risks, benefits and alternatives for the proposed anesthesia with the patient or authorized representative who has indicated his/her understanding and acceptance.   Dental advisory given  Plan Discussed with: CRNA, Surgeon and Anesthesiologist  Anesthesia Plan Comments:         Anesthesia Quick Evaluation

## 2013-03-19 NOTE — Preoperative (Signed)
Beta Blockers   Reason not to administer Beta Blockers:Labetalol 0830 today

## 2013-03-19 NOTE — Anesthesia Postprocedure Evaluation (Signed)
Anesthesia Post Note  [name]  Procedure(s) Performed: Procedure(s) (LRB): COMPUTER ASSISTED TOTAL KNEE ARTHROPLASTY- left (Left)  Anesthesia type: general  Patient location: PACU  Post pain: Pain level controlled  Post assessment: Patient's Cardiovascular Status Stable  Last Vitals:  Filed Vitals:   03/19/13 1545  BP: 160/88  Pulse: 64  Temp:   Resp: 14    Post vital signs: Reviewed and stable  Level of consciousness: sedated  Complications: No apparent anesthesia complications

## 2013-03-19 NOTE — Transfer of Care (Signed)
Immediate Anesthesia Transfer of Care Note  [name]  Procedure(s) Performed: Procedure(s) with comments: COMPUTER ASSISTED TOTAL KNEE ARTHROPLASTY- left (Left) - Left Total Knee Arthroplasty, computer assist, cemented  Patient Location: PACU  Anesthesia Type:General  Level of Consciousness: awake and patient cooperative  Airway & Oxygen Therapy: Patient Spontanous Breathing and Patient connected to nasal cannula oxygen  Post-op Assessment: Report given to PACU RN, Post -op Vital signs reviewed and stable and Patient moving all extremities  Post vital signs: Reviewed and stable  Complications: No apparent anesthesia complications

## 2013-03-19 NOTE — Plan of Care (Signed)
Problem: Consults Goal: Diagnosis- Total Joint Replacement Primary Total Knee     

## 2013-03-19 NOTE — Interval H&P Note (Signed)
History and Physical Interval Note:  03/19/2013 12:23 PM  Greg [name]  has presented today for surgery, with the diagnosis of Left Knee Osteoarthritis   The various methods of treatment have been discussed with the patient and family. After consideration of risks, benefits and other options for treatment, the patient has consented to  Procedure(s) with comments: COMPUTER ASSISTED TOTAL KNEE ARTHROPLASTY (Left) - Left Total Knee Arthroplasty, computer assist, cemented as a surgical intervention .  The patient's history has been reviewed, patient examined, no change in status, stable for surgery.  I have reviewed the patient's chart and labs.  Questions were answered to the patient's satisfaction.     Vedanth Sirico C

## 2013-03-19 NOTE — Progress Notes (Signed)
Utilization review completed. Jonnie Finner RN CCM Case Mgmt phone [phone]

## 2013-03-20 ENCOUNTER — Encounter (HOSPITAL_COMMUNITY): Payer: Self-pay | Admitting: Orthopaedic Surgery

## 2013-03-20 LAB — BASIC METABOLIC PANEL
BUN: 24 mg/dL — ABNORMAL HIGH (ref 6–23)
CO2: 25 mEq/L (ref 19–32)
Chloride: 102 mEq/L (ref 96–112)
Creatinine, Ser: 2.42 mg/dL — ABNORMAL HIGH (ref 0.50–1.35)
Glucose, Bld: 132 mg/dL — ABNORMAL HIGH (ref 70–99)
Potassium: 4.3 mEq/L (ref 3.5–5.1)

## 2013-03-20 LAB — CBC
HCT: 28.1 % — ABNORMAL LOW (ref 39.0–52.0)
Hemoglobin: 9.7 g/dL — ABNORMAL LOW (ref 13.0–17.0)
MCV: 96.2 fL (ref 78.0–100.0)
WBC: 5.3 10*3/uL (ref 4.0–10.5)

## 2013-03-20 NOTE — Op Note (Signed)
NAMEDEWAUN, GILLION                ACCOUNT NO.:  [id number]  MEDICAL RECORD NO.:  [id number]  LOCATION:  5N10C                        FACILITY:  [facility]  PHYSICIAN:  Ricco Dershem C. Lorin Mercy, M.D.    DATE OF BIRTH:  [birth year]  DATE OF PROCEDURE:  03/19/2013 DATE OF DISCHARGE:                              OPERATIVE REPORT   PREOPERATIVE DIAGNOSIS:  Left knee osteoarthritis.  POSTOPERATIVE DIAGNOSIS:  Left knee osteoarthritis.  PROCEDURE:  Left cemented total knee arthroplasty, computer assist.  SURGEON:  Edmar Blankenburg C. Lorin Mercy, M.D.  ASSISTANT:  Phillips Hay, medically necessary and present for the procedure for hemostasis and closure.  ANESTHESIA:  GOT plus preoperative femoral nerve block.  EBL:  Less than 100 mL.  TOURNIQUET TIME:  1 hour and 6 minutes x350.  COMPONENTS:  DePuy rotating platform #3 femur with lugs, #3 tibia, 10 mm polyethylene, 38 mm 3 pegged all poly patella, LCS.  PROCEDURE:  After induction of general anesthesia, 2 g of Ancef prophylaxis, proximal thigh tourniquet lateral post heel bump, 10/15 drape, preoperative Foley catheter placement.  The leg was prepped with DuraPrep, the usual split sheets, drapes, impervious stockinette, Coban, sterile skin marker, and Betadine, Steri-Drape was used to seal the skin.  Time-out procedure was completed.  Leg was wrapped in Esmarch, tourniquet inflated.  Midline incision was made.  Superficial retinaculum divided and medial parapatellar tendon incision, splitting the quad tendon between the medial one-third and lateral two-thirds. Patella was everted, cut from facet removing 10 mm of bone, size drill for 38 mm.  Menisci remnants were resected.  The medial femoral condyle had large medial spurs, which were removed with Le[place]ell.  There was bone- on-bone contact in the medial compartment, some spared areas of cartilage left in the lateral compartment and severe patellofemoral arthritis.  Pins were placed in the femur to the tibia.   Computer model was generated size 3 which was identical to the opposite knee, which had a good fit.  Distal femur cut, chamfer cuts were made.  External rotation was off by 1 degree.  After chamfer cuts, tibia was cut AP. There was slight anterior slope and slight varus, little bit of additional bone was removed from the lateral compartment posterior with correction.  Confirmation of verification keel preparation.  Trial sizers 10 mm gave full extension and flexion/extension gaps for less than 0.5 mm difference.  Lug nuts were drilled in the femur.  Pulsatile lavage after removal of the trials and then cementing the tibia followed by femur insertion of 10 mm all poly rotating platform, and patellar button held with a clamp.  Cement was hardened 15 minutes, tourniquet deflated, hemostasis obtained, and then standard layered closure.  Deep layer with interrupted, flexor retinaculum subcu with 2-0 Vicryl, and subcuticular skin closure.  Postop dressing and knee immobilizer was applied.  4-0 Vicryl in the stab portals for tibial pins.  Pins were removed just prior to closure of the wound.  The patient tolerated the procedure well.  Instrument count and needle count was correct.     Nawal Burling C. Lorin Mercy, M.D.     MCY/MEDQ  D:  03/19/2013  T:  03/20/2013  Job:  [id number]

## 2013-03-20 NOTE — Evaluation (Signed)
Physical Therapy Evaluation Patient Details Name: Greg [name] MRN: [id number] DOB: 1933/03/23 Today's Date: 03/20/2013 Time: [id number] PT Time Calculation (min): 33 min  PT Assessment / Plan / Recommendation History of Present Illness  Pt is a 79 y/o male s/p L TKA on 03/19/2013  Clinical Impression  Pt presents to therapy s/p L TKA. Pt is performing functional mobility remarkably well and does not report any pain throughout session. Pt was able to ambulate 200 feet with a gait pattern that was [facility], and pt reports putting a significant amount of weight through the L LE. AROM: 4-100 degrees. This patient would benefit from continued PT interventions to address functional limitations, improve safety and independence with functional mobility, and return to PLOF.    PT Assessment  Patient needs continued PT services    Follow Up Recommendations  Home health PT    Does the patient have the potential to tolerate intense rehabilitation      Barriers to Discharge        Equipment Recommendations  None recommended by PT    Recommendations for Other Services     Frequency 7X/week    Precautions / Restrictions Precautions Precautions: Fall Restrictions Weight Bearing Restrictions: Yes LLE Weight Bearing: Weight bearing as tolerated   Pertinent Vitals/Pain Pt reports no pain throughout session.      Mobility  Bed Mobility Bed Mobility: Supine to Sit Supine to Sit: 6: Modified independent (Device/Increase time) Details for Bed Mobility Assistance: Pt demonstrated proper sequencing and safety awareness during bed mobility Transfers Transfers: Sit to Stand;Stand to Sit Sit to Stand: 5: Supervision Stand to Sit: 5: Supervision Details for Transfer Assistance: Pt demonstrated proper technique and safety awareness during transfers. Ambulation/Gait Ambulation/Gait Assistance: 4: Min guard;5: Supervision Ambulation Distance (Feet): 200 Feet Assistive device: Rolling  walker Ambulation/Gait Assistance Details: Supervision was appropriate during ambulation/gait training Gait Pattern: Within Functional Limits General Gait Details: Pt demonstrated fantastic gait pattern for first time ambulating, and reports no pain during weight bearing.     Exercises Total Joint Exercises Ankle Circles/Pumps: 10 reps Quad Sets: 10 reps Towel Squeeze: 10 reps Heel Slides: 10 reps Hip ABduction/ADduction: 10 reps Straight Leg Raises: 10 reps Long Arc Quad: 10 reps Goniometric ROM: 4-100 degrees   PT Diagnosis:    PT Problem List: Decreased strength;Decreased range of motion;Decreased activity tolerance;Decreased balance;Decreased mobility;Decreased knowledge of use of DME;Decreased safety awareness PT Treatment Interventions: DME instruction;Gait training;Functional mobility training;Therapeutic activities;Therapeutic exercise;Neuromuscular re-education;Patient/family education     PT Goals(Current goals can be found in the care plan section) Acute Rehab PT Goals Patient Stated Goal: To return home PT Goal Formulation: With patient Time For Goal Achievement: 03/27/13 Potential to Achieve Goals: Good  Visit Information  Last PT Received On: 03/20/13 Assistance Needed: +1 History of Present Illness: Pt is a 79 y/o male s/p L TKA on 03/19/2013       Prior [place] expects to be discharged to:: Private residence Living Arrangements: Children Available Help at Discharge: Family Type of Home: House Home Access: Level entry Home Layout: Able to live on main level with bedroom/bathroom Home Equipment: Kasandra Knudsen - single point;Walker - 2 wheels;Crutches;Bedside commode Prior Function Level of Independence: Independent Communication Communication: No difficulties Dominant Hand: Right    Cognition  Cognition Arousal/Alertness: Awake/alert Behavior During Therapy: WFL for tasks assessed/performed Overall Cognitive Status: Within  Functional Limits for tasks assessed    Extremity/Trunk Assessment Upper Extremity Assessment Upper Extremity Assessment: Defer to OT evaluation Lower Extremity  Assessment Lower Extremity Assessment: Generalized weakness   Balance    End of Session PT - End of Session Equipment Utilized During Treatment: Gait belt Activity Tolerance: Patient tolerated treatment well Patient left: in chair;with call bell/phone within reach Nurse Communication: Mobility status  GP     Jolyn Lent 03/20/2013, 12:19 PM  Jolyn Lent, PT Acute Rehabilitation Services

## 2013-03-20 NOTE — Progress Notes (Signed)
Physical Therapy Treatment Patient Details Name: Greg [name] MRN: [id number] DOB: 1932-11-16 Today's Date: 03/20/2013 Time: [id number] PT Time Calculation (min): 25 min  PT Assessment / Plan / Recommendation  History of Present Illness Pt is a 79 y/o male s/p L TKA on 03/19/2013   Clinical Impression Pt progressing very well towards physical therapy goals. Pt reports no pain throughout session and was able to ambulate with minimal WB through UE's. Pt demonstrated independence with bed mobility and supine<>sit transfers without use of bedrails.        Follow Up Recommendations  Home health PT     Does the patient have the potential to tolerate intense rehabilitation     Barriers to Discharge        Equipment Recommendations  None recommended by PT    Recommendations for Other Services    Frequency 7X/week   Progress towards PT Goals Progress towards PT goals: Progressing toward goals  Plan Current plan remains appropriate    Precautions / Restrictions Precautions Precautions: Fall Restrictions Weight Bearing Restrictions: Yes LLE Weight Bearing: Weight bearing as tolerated   Pertinent Vitals/Pain Pt reports no pain throughout session    Mobility  Bed Mobility Bed Mobility: Supine to Sit;Sit to Supine Supine to Sit: 7: Independent Sit to Supine: 7: Independent Details for Bed Mobility Assistance: Pt demonstrated proper sequencing and safety awareness during bed mobility Transfers Transfers: Sit to Stand;Stand to Sit Sit to Stand: 7: Independent Stand to Sit: 7: Independent Details for Transfer Assistance: Pt demonstrated proper technique and safety awareness during transfers. Ambulation/Gait Ambulation/Gait Assistance: 6: Modified independent (Device/Increase time) Ambulation Distance (Feet): 225 Feet Assistive device: Rolling walker Gait Pattern: Within Functional Limits General Gait Details: Pt continues to show great gait pattern and reports no pain. Stairs:  Yes Stairs Assistance: 4: Min guard Stair Management Technique: Two rails Number of Stairs: 5    Exercises Total Joint Exercises Ankle Circles/Pumps: 10 reps Quad Sets: 10 reps Short Arc Quad: 10 reps Heel Slides: 10 reps Hip ABduction/ADduction: 10 reps Straight Leg Raises: 10 reps   PT Diagnosis:    PT Problem List:   PT Treatment Interventions:     PT Goals (current goals can now be found in the care plan section) Acute Rehab PT Goals Patient Stated Goal: To return home PT Goal Formulation: With patient Time For Goal Achievement: 03/27/13 Potential to Achieve Goals: Good  Visit Information  Last PT Received On: 03/20/13 Assistance Needed: +1 History of Present Illness: Pt is a 79 y/o male s/p L TKA on 03/19/2013    Subjective Data  Subjective: "I'm doing good, the doctor thinks I can go home tomorrow." Patient Stated Goal: To return home   Cognition  Cognition Arousal/Alertness: Awake/alert Behavior During Therapy: WFL for tasks assessed/performed Overall Cognitive Status: Within Functional Limits for tasks assessed    Balance     End of Session PT - End of Session Equipment Utilized During Treatment: Gait belt Activity Tolerance: Patient tolerated treatment well Patient left: in chair;with call bell/phone within reach Nurse Communication: Mobility status   GP     Jolyn Lent 03/20/2013, 3:29 PM  Jolyn Lent, PT Acute Rehabilitation Services

## 2013-03-20 NOTE — Progress Notes (Signed)
Subjective: 1 Day Post-Op Procedure(s) (LRB): COMPUTER ASSISTED TOTAL KNEE ARTHROPLASTY- left (Left) Patient reports pain as mild.    Objective: Vital signs in last 24 hours: Temp:  [97.6 F (36.4 C)-98.8 F (37.1 C)] 98.7 F (37.1 C) (07/22 0500) Pulse Rate:  [64-99] 91 (07/22 0500) Resp:  [13-18] 18 (07/22 0500) BP: (131-174)/(60-97) 131/69 mmHg (07/22 0500) SpO2:  [98 %-100 %] 98 % (07/22 0500)  Intake/Output from previous day: 07/21 0701 - 07/22 0700 In: 2047.5 [I.V.:1947.5] Out: 1675 [Urine:1600; Blood:75] Intake/Output this shift:     Recent Labs  03/20/13 0750  HGB 9.7*    Recent Labs  03/20/13 0750  WBC 5.3  RBC 2.92*  HCT 28.1*  PLT 120*    Recent Labs  03/20/13 0750  NA 137  K 4.3  CL 102  CO2 25  BUN 24*  CREATININE 2.42*  GLUCOSE 132*  CALCIUM 9.0   No results found for this basename: LABPT, INR,  in the last 72 hours  Neurologically intact  Assessment/Plan: 1 Day Post-Op Procedure(s) (LRB): COMPUTER ASSISTED TOTAL KNEE ARTHROPLASTY- left (Left) Up with therapy  D/C iv    Home tomorrow. Ambulated 300 ft and flexing to 100 degrees.   Greg [name] C 03/20/2013, 12:30 PM

## 2013-03-21 LAB — CBC
HCT: 24.2 % — ABNORMAL LOW (ref 39.0–52.0)
Hemoglobin: 8.3 g/dL — ABNORMAL LOW (ref 13.0–17.0)
MCH: 32.9 pg (ref 26.0–34.0)
MCHC: 34.3 g/dL (ref 30.0–36.0)
RDW: 12.7 % (ref 11.5–15.5)

## 2013-03-21 NOTE — Progress Notes (Signed)
OT Cancellation Note  Patient Details Name: DENSON JULIAN MRN: [id number] DOB: 05/10/1933   Cancelled Treatment:    Reason Eval/Treat Not Completed: OT screened, no needs identified, will sign off  Order received, reviewed chart, and spoke with pt. Pt has no OT concerns/needs at this time. Will sign off.     Benito Mccreedy OTR/L [id number] 03/21/2013, 10:15 AM

## 2013-03-21 NOTE — Progress Notes (Signed)
Subjective: 2 Days Post-Op Procedure(s) (LRB): COMPUTER ASSISTED TOTAL KNEE ARTHROPLASTY- left (Left) Patient reports pain as mild.    Objective: Vital signs in last 24 hours: Temp:  [98.1 F (36.7 C)] 98.1 F (36.7 C) (07/22 2032) Pulse Rate:  [71-76] 71 (07/22 2032) Resp:  [16-18] 16 (07/23 0400) BP: (137-140)/(63-64) 137/63 mmHg (07/22 2032) SpO2:  [98 %-100 %] 98 % (07/22 2032)  Intake/Output from previous day: 07/22 0701 - 07/23 0700 In: -  Out: 225 [Urine:225] Intake/Output this shift:     Recent Labs  03/20/13 0750 03/21/13 0445  HGB 9.7* 8.3*    Recent Labs  03/20/13 0750 03/21/13 0445  WBC 5.3 5.3  RBC 2.92* 2.52*  HCT 28.1* 24.2*  PLT 120* 105*    Recent Labs  03/20/13 0750  NA 137  K 4.3  CL 102  CO2 25  BUN 24*  CREATININE 2.42*  GLUCOSE 132*  CALCIUM 9.0   No results found for this basename: LABPT, INR,  in the last 72 hours  Neurovascular intact Sensation intact distally Intact pulses distally Dorsiflexion/Plantar flexion intact Incision: no drainage  Assessment/Plan: 2 Days Post-Op Procedure(s) (LRB): COMPUTER ASSISTED TOTAL KNEE ARTHROPLASTY- left (Left) Discharge home with home health Pt has pain meds at home and rx written in case these are not enough ASA for VTE OV  2 weeks  Tess Potts M 03/21/2013, 8:26 AM

## 2013-03-21 NOTE — Discharge Summary (Signed)
Physician Discharge Summary  Patient ID: Greg [name] MRN: [id number] DOB/AGE: 12/24/1932 79 y.o.  Admit date: 03/19/2013 Discharge date: 03/21/2013  Admission Diagnoses:  Osteoarthritis of left knee  Discharge Diagnoses:  Principal Problem:   Osteoarthritis of left knee   Past Medical History  Diagnosis Date  . Glaucoma     can't see out of right eye  . Venous insufficiency   . Hypercholesteremia     takes Simvastatin nightly  . History of colonic polyps     adenomatous  . Renal insufficiency   . History of prostatitis   . DJD (degenerative joint disease)   . Dizziness   . Anxiety   . Blind right eye   . Hypertension     takes Labetolol daily  . Joint pain   . GERD (gastroesophageal reflux disease)     only when eats greasy foods and takes OTC meds prn  . History of colon polyps   . Enlarged prostate     takes Flomax daily  . Nocturia     Surgeries: Procedure(s): COMPUTER ASSISTED TOTAL KNEE ARTHROPLASTY- left on 03/19/2013   Consultants (if any):   none  Discharged Condition: Improved  Hospital Course: Greg [name] is an 79 y.o. male who was admitted 03/19/2013 with a diagnosis of Osteoarthritis of left knee and went to the operating room on 03/19/2013 and underwent the above named procedures.    He was given perioperative antibiotics:  Anti-infectives   Start     Dose/Rate Route Frequency Ordered Stop   03/19/13 1700  ceFAZolin (ANCEF) IVPB 1 g/50 mL premix     1 g 100 mL/hr over 30 Minutes Intravenous Every 6 hours 03/19/13 1649 03/19/13 2355   03/19/13 0600  ceFAZolin (ANCEF) IVPB 2 g/50 mL premix     2 g 100 mL/hr over 30 Minutes Intravenous On call to O.R. 03/18/13 1404 03/19/13 1240    .  He was given sequential compression devices, early ambulation, and aspirin for DVT prophylaxis.  He benefited maximally from the hospital stay and there were no complications.    Recent vital signs:  Filed Vitals:   03/21/13 0400  BP:   Pulse:   Temp:    Resp: 16    Recent laboratory studies:  Lab Results  Component Value Date   HGB 8.3* 03/21/2013   HGB 9.7* 03/20/2013   HGB 13.9 03/09/2013   Lab Results  Component Value Date   WBC 5.3 03/21/2013   PLT 105* 03/21/2013   Lab Results  Component Value Date   INR 0.91 03/09/2013   Lab Results  Component Value Date   NA 137 03/20/2013   K 4.3 03/20/2013   CL 102 03/20/2013   CO2 25 03/20/2013   BUN 24* 03/20/2013   CREATININE 2.42* 03/20/2013   GLUCOSE 132* 03/20/2013    Discharge Medications:     Medication List         aspirin 325 MG tablet  Take 325 mg by mouth daily.     diclofenac 75 MG EC tablet  Commonly known as:  VOLTAREN  Take 75 mg by mouth 2 (two) times daily as needed (for inflamation).     FLOMAX 0.4 MG Caps  Generic drug:  tamsulosin  Take 0.4 mg by mouth daily.     labetalol 200 MG tablet  Commonly known as:  NORMODYNE  take 2 tablets by mouth twice a day     methocarbamol 500 MG tablet  Commonly known as:  ROBAXIN  Take 1 tablet (500 mg total) by mouth every 6 (six) hours as needed (spasm).     multivitamin with minerals Tabs  Take 1 tablet by mouth daily.     oxyCODONE-acetaminophen 5-325 MG per tablet  Commonly known as:  ROXICET  Take 1-2 tablets by mouth every 4 (four) hours as needed for pain.     PROTEGRA PO  Take 1 tablet by mouth daily.     simvastatin 40 MG tablet  Commonly known as:  ZOCOR  take 1 tablet by mouth at bedtime        Diagnostic Studies: No results found.  Disposition: 06-Home-Health Care Svc      Discharge Orders   Future Appointments Provider Department Dept Phone   07/17/2013 9:00 AM Noralee Space, MD [facility] [phone]   11/12/2013 9:00 AM Hayden Pedro, MD [facility] [phone]   Future Orders Complete By Expires     Call MD / Call 911  As directed     Comments:      If you experience chest pain or shortness of breath, CALL 911 and be transported to the  hospital emergency room.  If you develope a fever above 101 F, pus (white drainage) or increased drainage or redness at the wound, or calf pain, call your surgeon's office.    Constipation Prevention  As directed     Comments:      Drink plenty of fluids.  Prune juice may be helpful.  You may use a stool softener, such as Colace (over the counter) 100 mg twice a day.  Use MiraLax (over the counter) for constipation as needed.    Diet - low sodium heart healthy  As directed     Discharge instructions  As directed     Comments:      Keep knee incision dry for 5 days post op then may wet while bathing. Therapy daily and  goal full extension and greater than 90 degrees flexion. Call if fever or chills or increased drainage. Go to ER if acutely short of breath or call for ambulance. Return for follow up in 2 weeks. May full weight bear on the surgical leg unless told otherwise. Use knee immobilizer until able to straight leg raise off bed with knee stable. In house walking for first 2 weeks.    Increase activity slowly as tolerated  As directed        Follow-up Information   Follow up with YATES,MARK C, MD. Schedule an appointment as soon as possible for a visit in 2 weeks.   Contact information:   [place] [address] [phone]       Please follow up.   Contact information:   [facility]-they will call you [phone]        Signed: Epimenio Foot 03/21/2013, 8:30 AM

## 2013-03-21 NOTE — Progress Notes (Signed)
Physical Therapy Treatment Patient Details Name: Greg [name] MRN: [id number] DOB: [birth year] Today's Date: 03/21/2013 Time: 0750-0807 PT Time Calculation (min): 17 min  PT Assessment / Plan / Recommendation  History of Present Illness Pt is a 79 y/o male s/p L TKA on 03/19/2013      PT Comments   Patient progressing well with therapy. Has met goals however goals not updated as patient plans to DC this morning.   Follow Up Recommendations  Home health PT     Does the patient have the potential to tolerate intense rehabilitation     Barriers to Discharge        Equipment Recommendations  None recommended by PT    Recommendations for Other Services    Frequency 7X/week   Progress towards PT Goals Progress towards PT goals: Progressing toward goals  Plan Current plan remains appropriate    Precautions / Restrictions Restrictions LLE Weight Bearing: Weight bearing as tolerated   Pertinent Vitals/Pain no apparent distress     Mobility  Bed Mobility Bed Mobility: Not assessed Transfers Sit to Stand: 7: Independent Stand to Sit: 7: Independent Ambulation/Gait Ambulation/Gait Assistance: 6: Modified independent (Device/Increase time) Ambulation Distance (Feet): 400 Feet Assistive device: Rolling walker Gait Pattern: Within Functional Limits    Exercises Total Joint Exercises Hip ABduction/ADduction: AROM;Left;15 reps Straight Leg Raises: AROM;Left;15 reps Marching in Standing: Left;15 reps;AROM   PT Diagnosis:    PT Problem List:   PT Treatment Interventions:     PT Goals (current goals can now be found in the care plan section)    Visit Information  Last PT Received On: 03/21/13 Assistance Needed: +1 History of Present Illness: Pt is a 79 y/o male s/p L TKA on 03/19/2013    Subjective Data      Cognition  Cognition Arousal/Alertness: Awake/alert Behavior During Therapy: WFL for tasks assessed/performed Overall Cognitive Status: Within Functional Limits  for tasks assessed    Balance     End of Session PT - End of Session Equipment Utilized During Treatment: Gait belt Activity Tolerance: Patient tolerated treatment well Patient left: in chair;with call bell/phone within reach   GP     Jacqualyn Posey 03/21/2013, 8:12 AM 03/21/2013 Jacqualyn Posey PTA [phone] pager [phone] office

## 2013-03-21 NOTE — Progress Notes (Signed)
Patient ID: Greg [name], male   DOB: 12-06-1932, 79 y.o.   MRN: [id number] Plan Home today after therapy. HHPT times one week then outpt therapy as he did with his other knee. D/C IV. Dressing change. Home todayy.

## 2013-03-22 NOTE — Progress Notes (Signed)
Discussed discharge instructions with patient, including follow-up appointments, medications, incision care, and mobility.  Safety also discussed. Teach-back method employed.  Patient asked appropriate questions and appeared to comprehend discussed topics.  Pt. Discharged to private home via private vehicle with relative.  Ambulated to exit escorted by nurse tech as per his request.

## 2013-03-31 ENCOUNTER — Other Ambulatory Visit: Payer: Self-pay | Admitting: Pulmonary Disease

## 2013-04-01 IMAGING — US US ABDOMEN COMPLETE
1 series · 14 of 25 positions shown · non-contrast
Comparison: None.

CLINICAL DATA: Abdominal pain.  Diabetic hypertensive patient.

COMPLETE ABDOMINAL ULTRASOUND

[Series 1: us abdomen complete · 0.30mm/px · 92 acquisitions, 14 frames shown]
[im 1/92]
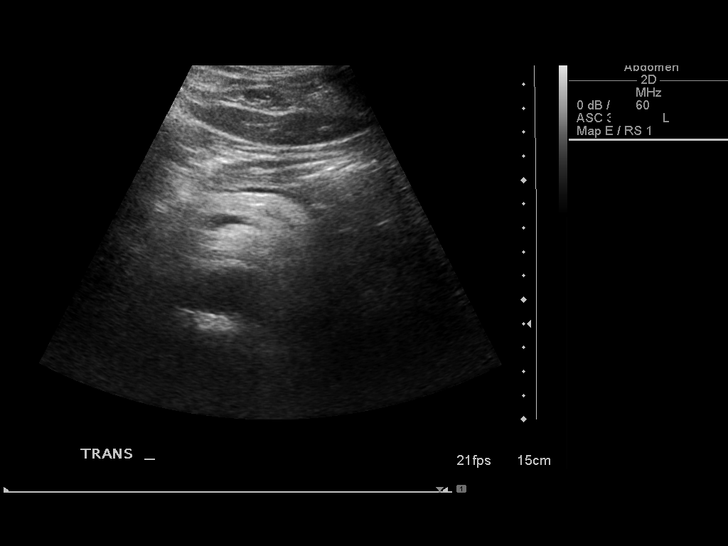
[im 8/92]
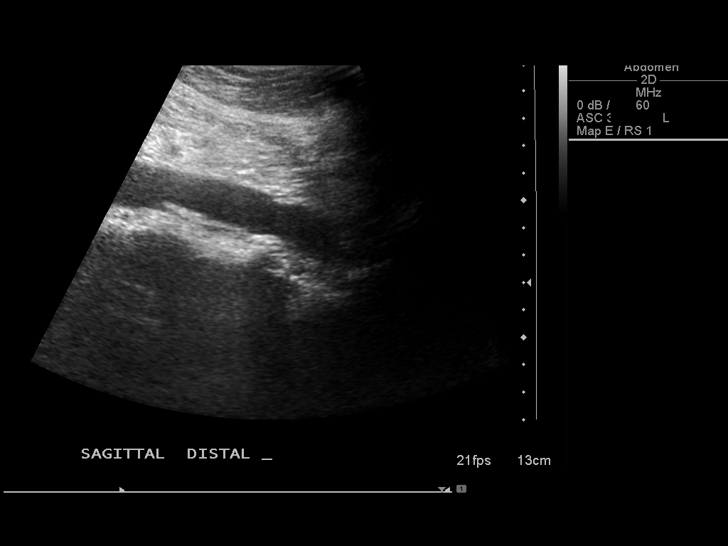
[im 16/92]
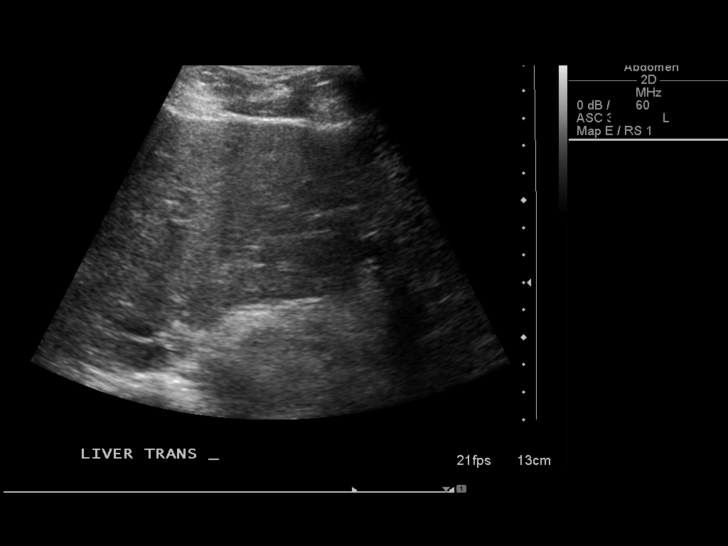
[im 23/92]
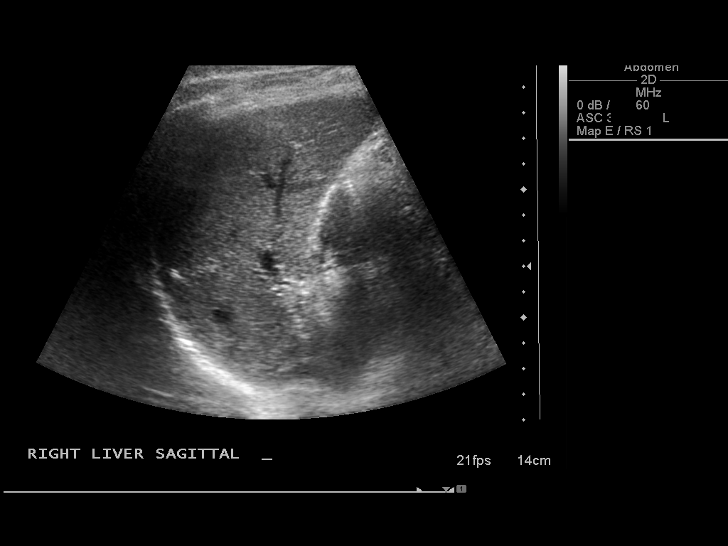
[im 31/92]
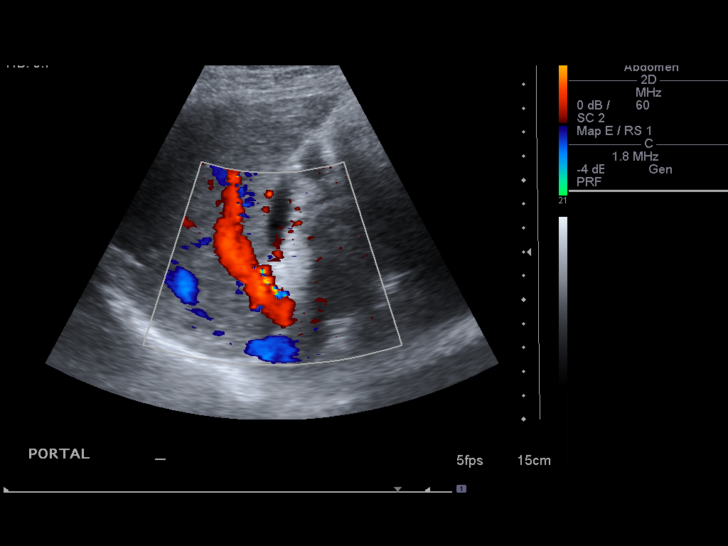
[im 35/92]
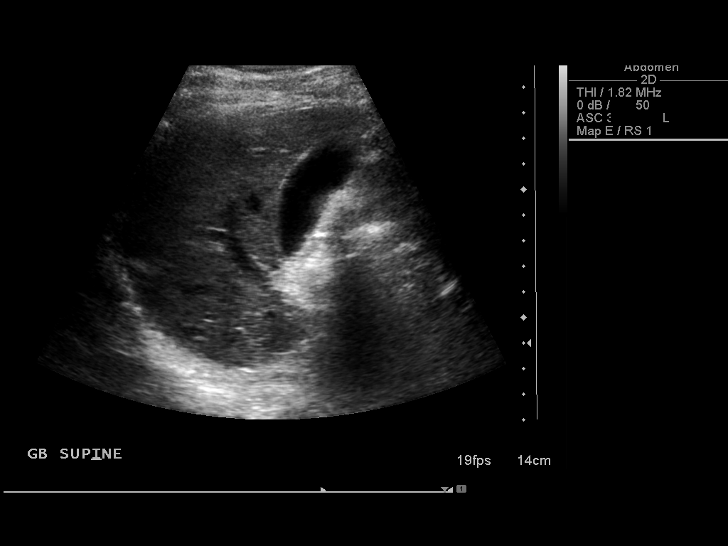
[im 42/92]
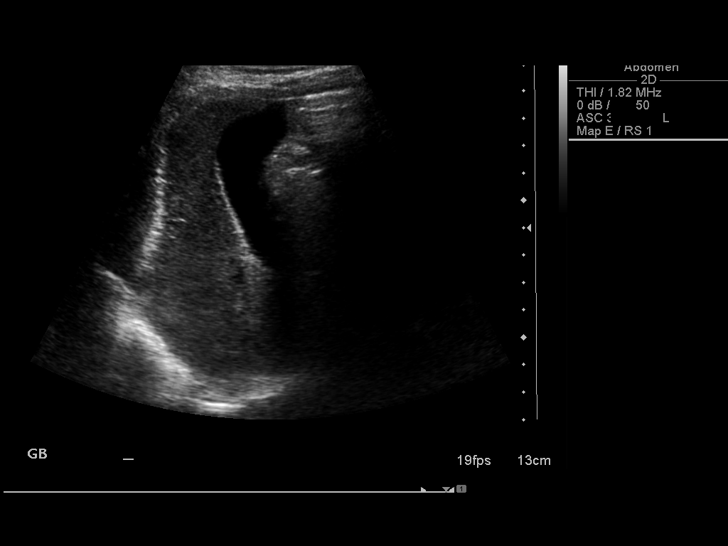
[im 50/92]
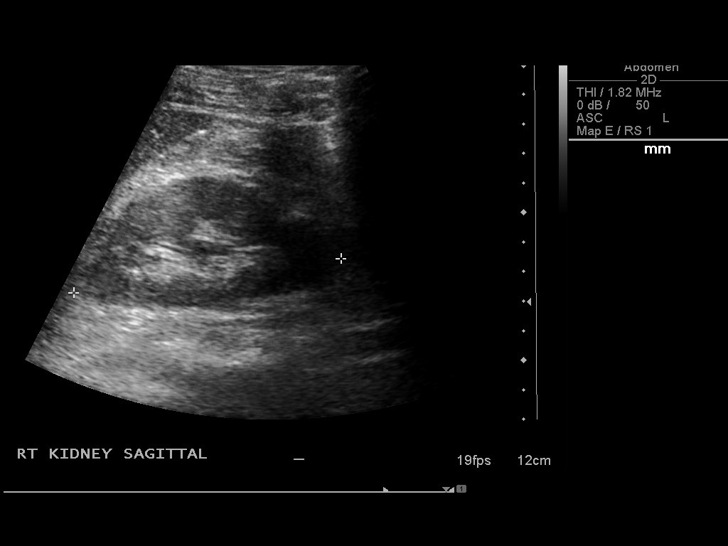
[im 57/92]
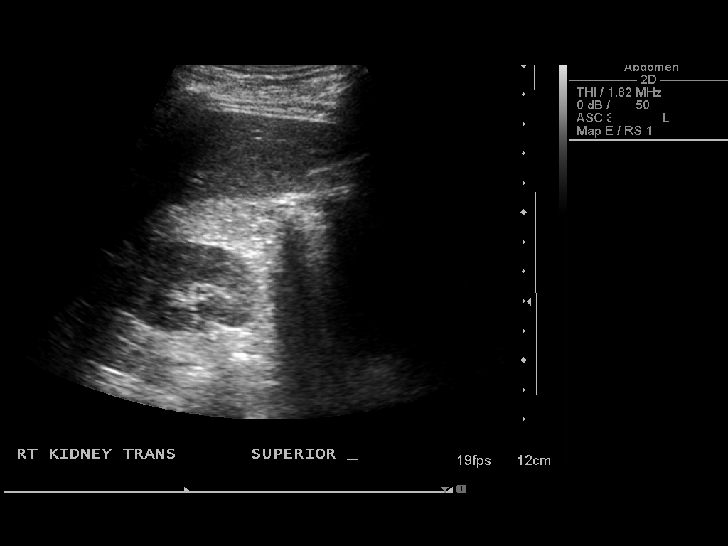
[im 61/92]
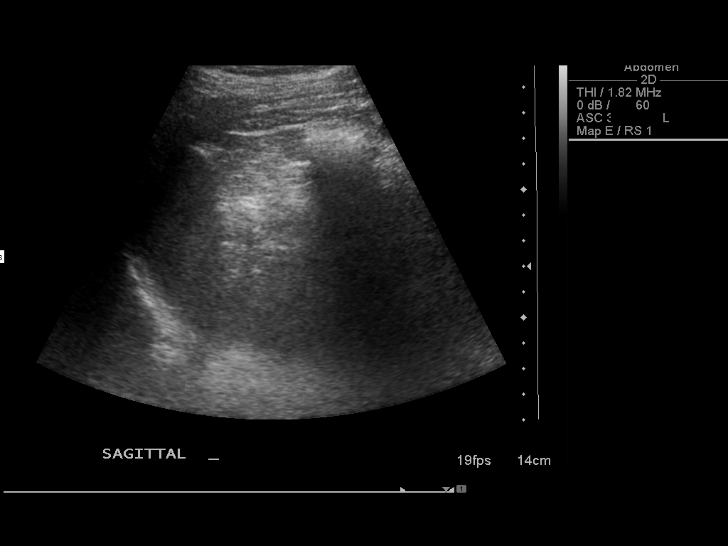
[im 69/92]
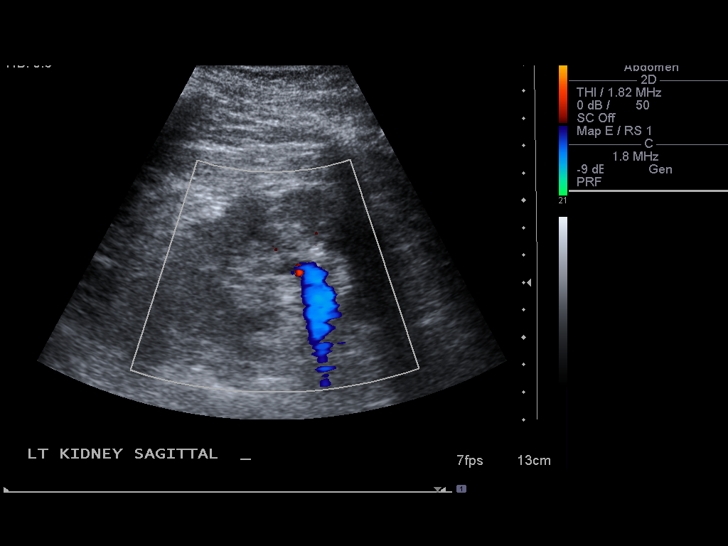
[im 76/92]
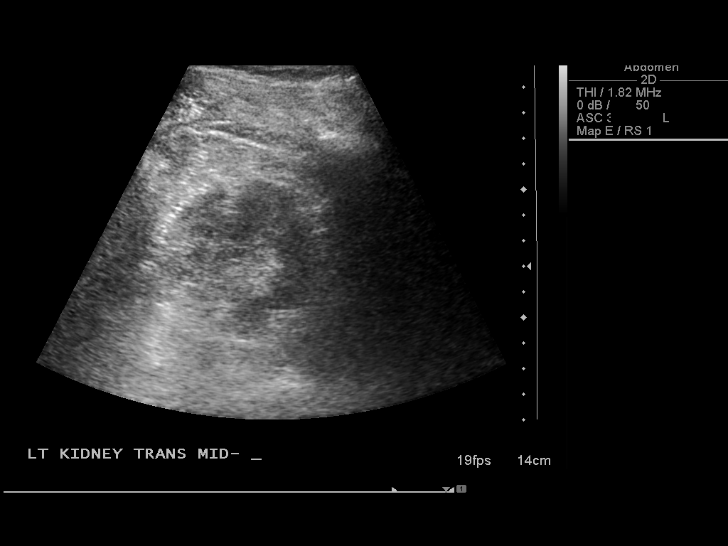
[im 84/92]
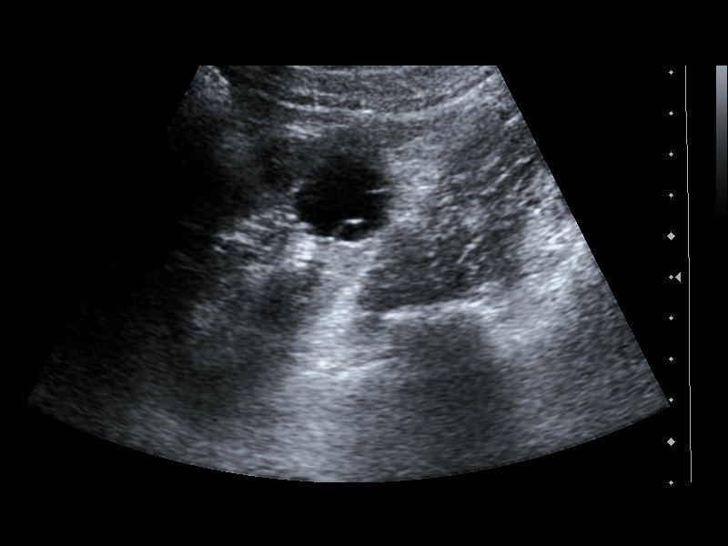
[im 92/92]
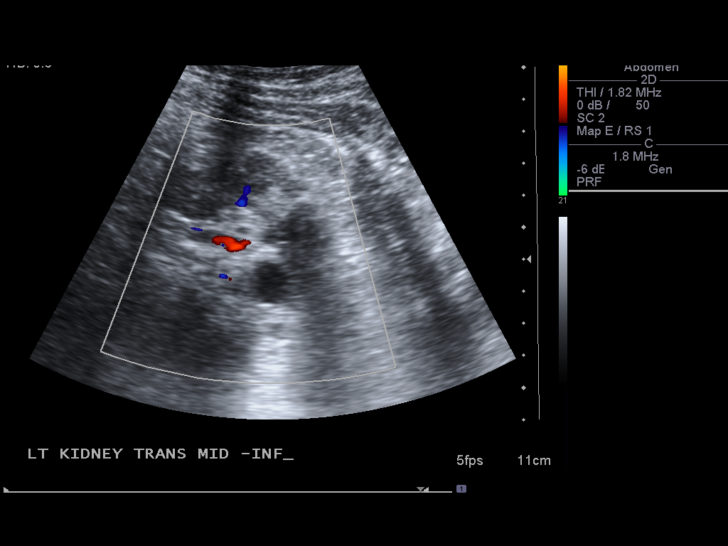

[14 of 25 positions shown; findings below may reference images not displayed]

FINDINGS: Gallbladder:  No gallstones, gallbladder wall thickening, or
pericholecystic fluid.

Common bile duct:  3.4 mm.

Liver:  Two tiny calcifications noted.  No other liver mass
detected.

IVC:  Evaluation limited by bowel gas.

Pancreas:  Evaluation limited by bowel gas.

Spleen:  3.4 cm.  No obvious mass.  Evaluation limited by bowel
gas.

Right Kidney:  9.1 cm. No hydronephrosis or renal mass..

Left Kidney:  9.2 cm.  No hydronephrosis. Three cysts  are noted
within the lower aspect of the left kidney measuring up to 2.4 cm.
One of the cysts appears slightly complex containing a septation.

Abdominal aorta:  No aneurysm identified.
IMPRESSION: No gallstones.

Limited evaluation of portions of the inferior vena cava, pancreas
and spleen secondary to bowel gas.

Three cysts  are noted within the lower aspect of the left kidney
measuring up to 2.4 cm.  One of the cysts appears slightly complex
containing a septation.

## 2013-04-02 ENCOUNTER — Telehealth: Payer: Self-pay | Admitting: Pulmonary Disease

## 2013-04-02 MED ORDER — LABETALOL HCL 200 MG PO TABS: ORAL_TABLET | ORAL | Status: DC

## 2013-04-02 NOTE — Telephone Encounter (Signed)
lmtcb x1--rx has been sent

## 2013-04-02 NOTE — Telephone Encounter (Signed)
Per SN---  Keep appt and ok to send in refills.  thanks

## 2013-04-02 NOTE — Telephone Encounter (Signed)
Greg [name], CMA at 11/21/2012 5:50 PM   Status: Signed            Per SN---  recs to increase the labetalol 200 mg To 2 po bid #120 and pt will need an ov with SN in 1 month on this new dose. Called and spoke with pt and he is aware and will call back to scheduled a follow up. Nothing further is needed.  --  I spoke with pt. He never schedule appt and he stated he wasn't told too. Pt is coming in tomorrow at 3 PM to see SN. He is out of his medication per pt today. He was only taking QD starting Thursday since he was running low. Please advise SN if we need to resend RX labetolol 200 mg 2 po BID? thanks

## 2013-04-02 NOTE — Addendum Note (Signed)
Addended by: Inge Rise on: 04/02/2013 01:47 PM   Modules accepted: Orders

## 2013-04-03 ENCOUNTER — Encounter: Payer: Self-pay | Admitting: Pulmonary Disease

## 2013-04-03 ENCOUNTER — Ambulatory Visit (INDEPENDENT_AMBULATORY_CARE_PROVIDER_SITE_OTHER): Payer: Medicare Other | Admitting: Pulmonary Disease

## 2013-04-03 VITALS — BP 120/72 | HR 68 | Temp 97.8°F | Ht 68.0 in | Wt 168.6 lb

## 2013-04-03 DIAGNOSIS — N139 Obstructive and reflux uropathy, unspecified: Secondary | ICD-10-CM

## 2013-04-03 DIAGNOSIS — N401 Enlarged prostate with lower urinary tract symptoms: Secondary | ICD-10-CM

## 2013-04-03 DIAGNOSIS — Z96653 Presence of artificial knee joint, bilateral: Secondary | ICD-10-CM

## 2013-04-03 DIAGNOSIS — E291 Testicular hypofunction: Secondary | ICD-10-CM

## 2013-04-03 DIAGNOSIS — E7800 Pure hypercholesterolemia, unspecified: Secondary | ICD-10-CM

## 2013-04-03 DIAGNOSIS — N259 Disorder resulting from impaired renal tubular function, unspecified: Secondary | ICD-10-CM

## 2013-04-03 DIAGNOSIS — M1990 Unspecified osteoarthritis, unspecified site: Secondary | ICD-10-CM

## 2013-04-03 DIAGNOSIS — D126 Benign neoplasm of colon, unspecified: Secondary | ICD-10-CM

## 2013-04-03 DIAGNOSIS — E538 Deficiency of other specified B group vitamins: Secondary | ICD-10-CM

## 2013-04-03 DIAGNOSIS — F419 Anxiety disorder, unspecified: Secondary | ICD-10-CM

## 2013-04-03 DIAGNOSIS — K219 Gastro-esophageal reflux disease without esophagitis: Secondary | ICD-10-CM

## 2013-04-03 DIAGNOSIS — D649 Anemia, unspecified: Secondary | ICD-10-CM

## 2013-04-03 DIAGNOSIS — I872 Venous insufficiency (chronic) (peripheral): Secondary | ICD-10-CM

## 2013-04-03 DIAGNOSIS — N138 Other obstructive and reflux uropathy: Secondary | ICD-10-CM

## 2013-04-03 DIAGNOSIS — Z96659 Presence of unspecified artificial knee joint: Secondary | ICD-10-CM | POA: Insufficient documentation

## 2013-04-03 DIAGNOSIS — I10 Essential (primary) hypertension: Secondary | ICD-10-CM

## 2013-04-03 NOTE — Patient Instructions (Addendum)
Today we updated your med list in our EPIC system...    Continue your current medications the same...  Please return to our lab one morning this week for your FASTING blood work...    We will contact you w/ the results when available...   Keep up the good job w/ your diet & exercise program...  Call for any questions...  Let's plan a follow up visit in 39mo, sooner if needed for problems.Marland KitchenMarland Kitchen
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
## 2013-04-05 ENCOUNTER — Other Ambulatory Visit (INDEPENDENT_AMBULATORY_CARE_PROVIDER_SITE_OTHER): Payer: Medicare Other

## 2013-04-05 DIAGNOSIS — D649 Anemia, unspecified: Secondary | ICD-10-CM

## 2013-04-05 DIAGNOSIS — E7800 Pure hypercholesterolemia, unspecified: Secondary | ICD-10-CM

## 2013-04-05 DIAGNOSIS — E538 Deficiency of other specified B group vitamins: Secondary | ICD-10-CM

## 2013-04-05 DIAGNOSIS — I10 Essential (primary) hypertension: Secondary | ICD-10-CM

## 2013-04-05 DIAGNOSIS — F419 Anxiety disorder, unspecified: Secondary | ICD-10-CM

## 2013-04-05 LAB — CBC WITH DIFFERENTIAL/PLATELET
Basophils Relative: 0.5 % (ref 0.0–3.0)
Eosinophils Absolute: 0.1 10*3/uL (ref 0.0–0.7)
MCHC: 33.6 g/dL (ref 30.0–36.0)
MCV: 100.6 fl — ABNORMAL HIGH (ref 78.0–100.0)
Monocytes Absolute: 0.4 10*3/uL (ref 0.1–1.0)
Neutrophils Relative %: 67.8 % (ref 43.0–77.0)
RBC: 3.29 Mil/uL — ABNORMAL LOW (ref 4.22–5.81)
RDW: 14.1 % (ref 11.5–14.6)

## 2013-04-05 LAB — LIPID PANEL
LDL Cholesterol: 72 mg/dL (ref 0–99)
Total CHOL/HDL Ratio: 3

## 2013-04-05 LAB — BASIC METABOLIC PANEL
Chloride: 105 mEq/L (ref 96–112)
Creatinine, Ser: 2.2 mg/dL — ABNORMAL HIGH (ref 0.4–1.5)
Potassium: 4.6 mEq/L (ref 3.5–5.1)
Sodium: 139 mEq/L (ref 135–145)

## 2013-04-05 LAB — HEPATIC FUNCTION PANEL
ALT: 16 U/L (ref 0–53)
AST: 16 U/L (ref 0–37)
Alkaline Phosphatase: 54 U/L (ref 39–117)
Bilirubin, Direct: 0.1 mg/dL (ref 0.0–0.3)
Total Bilirubin: 0.8 mg/dL (ref 0.3–1.2)

## 2013-04-05 LAB — IBC PANEL: Iron: 80 ug/dL (ref 42–165)

## 2013-04-16 ENCOUNTER — Telehealth: Payer: Self-pay | Admitting: Pulmonary Disease

## 2013-04-16 NOTE — Telephone Encounter (Signed)
Called and spoke with pt and he stated that he has been feeling swimmy headed  132/67 pulse 78.   Pt stated that when he gets up and starts  Moving around he feels this way.  He stated that this has been going on for a couple of weeks.  He stated that he has to sit down when this happens because he feels like he is going to fall down.  Pt is aware that SN is out of the office now, but we will forward this message to him and we will let the pt know of his recs.  SN please advise. Thanks  Allergies  Allergen Reactions  . Lisinopril     REACTION: INTOL to all ACE's  . Valsartan     REACTION: INTOL to all ARB's

## 2013-04-17 MED ORDER — MECLIZINE HCL 25 MG PO TABS: 25.0000 mg | ORAL_TABLET | Freq: Four times a day (QID) | ORAL | Status: DC | PRN

## 2013-04-17 NOTE — Telephone Encounter (Signed)
Per SN---  BP and pulse are normal.   Change positions slowly Drink plenty of fluids Try antivert 25 mg  #50  1 po every 6 hours prn dizziness.

## 2013-04-17 NOTE — Telephone Encounter (Signed)
LMOMTCB x1 for pt 

## 2013-04-17 NOTE — Telephone Encounter (Signed)
Pt advised and refill sent. [place], CMA

## 2013-05-24 ENCOUNTER — Ambulatory Visit (INDEPENDENT_AMBULATORY_CARE_PROVIDER_SITE_OTHER): Payer: Medicare Other

## 2013-05-24 DIAGNOSIS — Z23 Encounter for immunization: Secondary | ICD-10-CM

## 2013-07-17 ENCOUNTER — Ambulatory Visit: Payer: Medicare Other | Admitting: Pulmonary Disease

## 2013-07-29 ENCOUNTER — Other Ambulatory Visit: Payer: Self-pay | Admitting: Pulmonary Disease

## 2013-07-30 ENCOUNTER — Telehealth: Payer: Self-pay | Admitting: Pulmonary Disease

## 2013-07-30 MED ORDER — LABETALOL HCL 200 MG PO TABS: ORAL_TABLET | ORAL | Status: DC

## 2013-07-30 NOTE — Telephone Encounter (Signed)
Pt aware RX has been sent. Nothing further needed 

## 2013-10-09 ENCOUNTER — Ambulatory Visit (INDEPENDENT_AMBULATORY_CARE_PROVIDER_SITE_OTHER): Payer: Medicare Other | Admitting: Pulmonary Disease

## 2013-10-09 ENCOUNTER — Encounter: Payer: Self-pay | Admitting: Pulmonary Disease

## 2013-10-09 ENCOUNTER — Other Ambulatory Visit (INDEPENDENT_AMBULATORY_CARE_PROVIDER_SITE_OTHER): Payer: Medicare Other

## 2013-10-09 VITALS — BP 144/80 | HR 60 | Temp 97.0°F | Ht 68.0 in | Wt 178.4 lb

## 2013-10-09 DIAGNOSIS — E7800 Pure hypercholesterolemia, unspecified: Secondary | ICD-10-CM

## 2013-10-09 DIAGNOSIS — D649 Anemia, unspecified: Secondary | ICD-10-CM

## 2013-10-09 DIAGNOSIS — F411 Generalized anxiety disorder: Secondary | ICD-10-CM

## 2013-10-09 DIAGNOSIS — R7309 Other abnormal glucose: Secondary | ICD-10-CM

## 2013-10-09 DIAGNOSIS — K219 Gastro-esophageal reflux disease without esophagitis: Secondary | ICD-10-CM

## 2013-10-09 DIAGNOSIS — M1990 Unspecified osteoarthritis, unspecified site: Secondary | ICD-10-CM

## 2013-10-09 DIAGNOSIS — I10 Essential (primary) hypertension: Secondary | ICD-10-CM

## 2013-10-09 DIAGNOSIS — D126 Benign neoplasm of colon, unspecified: Secondary | ICD-10-CM

## 2013-10-09 DIAGNOSIS — N401 Enlarged prostate with lower urinary tract symptoms: Secondary | ICD-10-CM

## 2013-10-09 DIAGNOSIS — N259 Disorder resulting from impaired renal tubular function, unspecified: Secondary | ICD-10-CM

## 2013-10-09 DIAGNOSIS — N138 Other obstructive and reflux uropathy: Secondary | ICD-10-CM

## 2013-10-09 DIAGNOSIS — Z96659 Presence of unspecified artificial knee joint: Secondary | ICD-10-CM

## 2013-10-09 DIAGNOSIS — I872 Venous insufficiency (chronic) (peripheral): Secondary | ICD-10-CM

## 2013-10-09 LAB — CBC WITH DIFFERENTIAL/PLATELET
BASOS PCT: 0.5 % (ref 0.0–3.0)
Basophils Absolute: 0 10*3/uL (ref 0.0–0.1)
EOS PCT: 4.6 % (ref 0.0–5.0)
Eosinophils Absolute: 0.2 10*3/uL (ref 0.0–0.7)
HCT: 39.2 % (ref 39.0–52.0)
Hemoglobin: 13 g/dL (ref 13.0–17.0)
LYMPHS PCT: 24.1 % (ref 12.0–46.0)
Lymphs Abs: 0.8 10*3/uL (ref 0.7–4.0)
MCHC: 33.1 g/dL (ref 30.0–36.0)
MCV: 98.8 fl (ref 78.0–100.0)
MONOS PCT: 10.9 % (ref 3.0–12.0)
Monocytes Absolute: 0.4 10*3/uL (ref 0.1–1.0)
NEUTROS PCT: 59.9 % (ref 43.0–77.0)
Neutro Abs: 2 10*3/uL (ref 1.4–7.7)
PLATELETS: 171 10*3/uL (ref 150.0–400.0)
RBC: 3.96 Mil/uL — AB (ref 4.22–5.81)
RDW: 14.6 % (ref 11.5–14.6)
WBC: 3.3 10*3/uL — AB (ref 4.5–10.5)

## 2013-10-09 LAB — BASIC METABOLIC PANEL
BUN: 17 mg/dL (ref 6–23)
CHLORIDE: 110 meq/L (ref 96–112)
CO2: 26 mEq/L (ref 19–32)
CREATININE: 1.9 mg/dL — AB (ref 0.4–1.5)
Calcium: 9 mg/dL (ref 8.4–10.5)
GFR: 43.21 mL/min — AB (ref >60.00)
Glucose, Bld: 99 mg/dL (ref 70–99)
Potassium: 4.7 mEq/L (ref 3.5–5.1)
Sodium: 142 mEq/L (ref 135–145)

## 2013-10-09 LAB — TSH: TSH: 7.27 u[IU]/mL — ABNORMAL HIGH (ref 0.35–5.50)
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
## 2013-10-09 NOTE — Patient Instructions (Signed)
Today we updated your med list in our EPIC system...    Continue your current medications the same...  Today we did your follow up blood work...    We will contact you w/ the results when available...   Remember to minimize the Voltaren (Diclofenac) & avoid OTC Advil/ Aleve etc to protect your kidney function...    For athritis pain you can use Tylenol, or the Oxycodone as needed...  Call for any questions...  .you should plan on a follow up visit in about 6 months w/ FASTING blood work at that time.Marland KitchenMarland Kitchen

## 2013-10-11 ENCOUNTER — Telehealth: Payer: Self-pay | Admitting: Pulmonary Disease

## 2013-10-11 NOTE — Telephone Encounter (Signed)
Alida, please advise on CMN thanks!

## 2013-10-12 MED ORDER — LEVOTHYROXINE SODIUM 50 MCG PO CAPS: 1.0000 | ORAL_CAPSULE | Freq: Every day | ORAL | Status: DC

## 2013-10-12 NOTE — Telephone Encounter (Signed)
Called and spoke with pt and he is aware of lab results per SN.  Pt is aware of levothy 38 has been sent to the pharmacy and pt is aware

## 2013-10-12 NOTE — Telephone Encounter (Signed)
Pt says he's returning leigh's call can be reached at [phone].Elnita Maxwell

## 2013-10-15 ENCOUNTER — Telehealth: Payer: Self-pay | Admitting: Pulmonary Disease

## 2013-10-15 MED ORDER — LEVOTHYROXINE SODIUM 50 MCG PO TABS: 50.0000 ug | ORAL_TABLET | Freq: Every day | ORAL | Status: DC

## 2013-10-15 NOTE — Telephone Encounter (Signed)
i called and spoke with pt. She reports the levothyroxine sodium 50 mcg capsules is not covered.  Requesting alternative. Please advise SN thanks  Allergies  Allergen Reactions  . Lisinopril     REACTION: INTOL to all ACE's  . Valsartan     REACTION: INTOL to all ARB's

## 2013-10-15 NOTE — Telephone Encounter (Signed)
Per SN --  Pt should have tablet form of Synthroid. I have sent in rx for tablet. Pt is aware.

## 2013-10-18 ENCOUNTER — Telehealth: Payer: Self-pay | Admitting: Pulmonary Disease

## 2013-10-18 MED ORDER — AMOXICILLIN-POT CLAVULANATE 875-125 MG PO TABS: 1.0000 | ORAL_TABLET | Freq: Two times a day (BID) | ORAL | Status: DC

## 2013-10-18 NOTE — Telephone Encounter (Signed)
Spoke with the pt  He is c/o prod cough with large amounts of yellow sputum x 3 days  Chest feels tight and sore from coughing  No f/c/s, CP, SOB or wheezing  Please advise thanks! Last ov 10/09/13 Allergies  Allergen Reactions  . Lisinopril     REACTION: INTOL to all ACE's  . Valsartan     REACTION: INTOL to all ARB's   Current Outpatient Prescriptions on File Prior to Visit  Medication Sig Dispense Refill  . aspirin 325 MG tablet Take 325 mg by mouth daily.       . diclofenac (VOLTAREN) 75 MG EC tablet Take 75 mg by mouth 2 (two) times daily as needed (for inflamation).      . labetalol (NORMODYNE) 200 MG tablet take 2 tablets by mouth twice a day  120 tablet  6  . levothyroxine (SYNTHROID) 50 MCG tablet Take 1 tablet (50 mcg total) by mouth daily before breakfast.  30 tablet  6  . Multiple Vitamin (MULTIVITAMIN WITH MINERALS) TABS Take 1 tablet by mouth daily.      . Multiple Vitamins-Minerals (PROTEGRA PO) Take 1 tablet by mouth daily.      [name] oxyCODONE-acetaminophen (ROXICET) 5-325 MG per tablet Take 1-2 tablets by mouth every 4 (four) hours as needed for pain.  60 tablet  0  . simvastatin (ZOCOR) 40 MG tablet take 1 tablet by mouth at bedtime  30 tablet  PRN  . Tamsulosin HCl (FLOMAX) 0.4 MG CAPS Take 0.4 mg by mouth daily.         No current facility-administered medications on file prior to visit.

## 2013-10-18 NOTE — Telephone Encounter (Signed)
Per SN---  augmentin 875 #14  1 po bid otc align once daily otc delsym 2 tsp bid otc mucinex 600 mg  2 po bid

## 2013-10-18 NOTE — Telephone Encounter (Signed)
Rx was sent to pharm  Pt aware of SN's recs  Nothing further needed

## 2013-10-19 NOTE — Telephone Encounter (Signed)
I have no cmn from ths company for this patient, Dr Lenna Gilford has signed all his paperwork, left msg for [employer] @ [facility] we do not have Verdie Mosher

## 2013-11-12 ENCOUNTER — Ambulatory Visit (INDEPENDENT_AMBULATORY_CARE_PROVIDER_SITE_OTHER): Payer: Medicare Other | Admitting: Ophthalmology

## 2013-11-19 ENCOUNTER — Ambulatory Visit (INDEPENDENT_AMBULATORY_CARE_PROVIDER_SITE_OTHER): Payer: Medicare Other | Admitting: Ophthalmology

## 2013-11-19 DIAGNOSIS — H3530 Unspecified macular degeneration: Secondary | ICD-10-CM

## 2013-11-19 DIAGNOSIS — I10 Essential (primary) hypertension: Secondary | ICD-10-CM

## 2013-11-19 DIAGNOSIS — Q143 Congenital malformation of choroid: Secondary | ICD-10-CM

## 2013-11-19 DIAGNOSIS — H43819 Vitreous degeneration, unspecified eye: Secondary | ICD-10-CM

## 2013-11-19 DIAGNOSIS — H35039 Hypertensive retinopathy, unspecified eye: Secondary | ICD-10-CM

## 2014-02-25 ENCOUNTER — Other Ambulatory Visit: Payer: Self-pay | Admitting: Pulmonary Disease

## 2014-03-14 ENCOUNTER — Other Ambulatory Visit: Payer: Self-pay | Admitting: Pulmonary Disease

## 2014-04-09 ENCOUNTER — Encounter: Payer: Self-pay | Admitting: Pulmonary Disease

## 2014-04-09 ENCOUNTER — Ambulatory Visit (INDEPENDENT_AMBULATORY_CARE_PROVIDER_SITE_OTHER): Payer: Medicare Other | Admitting: Pulmonary Disease

## 2014-04-09 ENCOUNTER — Other Ambulatory Visit (INDEPENDENT_AMBULATORY_CARE_PROVIDER_SITE_OTHER): Payer: Medicare Other

## 2014-04-09 VITALS — BP 110/64 | HR 65 | Temp 97.8°F | Ht 68.0 in | Wt 171.6 lb

## 2014-04-09 DIAGNOSIS — Z96653 Presence of artificial knee joint, bilateral: Secondary | ICD-10-CM

## 2014-04-09 DIAGNOSIS — I10 Essential (primary) hypertension: Secondary | ICD-10-CM

## 2014-04-09 DIAGNOSIS — F411 Generalized anxiety disorder: Secondary | ICD-10-CM

## 2014-04-09 DIAGNOSIS — N139 Obstructive and reflux uropathy, unspecified: Secondary | ICD-10-CM

## 2014-04-09 DIAGNOSIS — N138 Other obstructive and reflux uropathy: Secondary | ICD-10-CM

## 2014-04-09 DIAGNOSIS — E7800 Pure hypercholesterolemia, unspecified: Secondary | ICD-10-CM

## 2014-04-09 DIAGNOSIS — N401 Enlarged prostate with lower urinary tract symptoms: Secondary | ICD-10-CM

## 2014-04-09 DIAGNOSIS — N259 Disorder resulting from impaired renal tubular function, unspecified: Secondary | ICD-10-CM

## 2014-04-09 DIAGNOSIS — D126 Benign neoplasm of colon, unspecified: Secondary | ICD-10-CM

## 2014-04-09 DIAGNOSIS — D638 Anemia in other chronic diseases classified elsewhere: Secondary | ICD-10-CM

## 2014-04-09 DIAGNOSIS — I872 Venous insufficiency (chronic) (peripheral): Secondary | ICD-10-CM

## 2014-04-09 DIAGNOSIS — K2100 Gastro-esophageal reflux disease with esophagitis, without bleeding: Secondary | ICD-10-CM

## 2014-04-09 DIAGNOSIS — Z96659 Presence of unspecified artificial knee joint: Secondary | ICD-10-CM

## 2014-04-09 DIAGNOSIS — E291 Testicular hypofunction: Secondary | ICD-10-CM

## 2014-04-09 DIAGNOSIS — R7309 Other abnormal glucose: Secondary | ICD-10-CM

## 2014-04-09 DIAGNOSIS — M1990 Unspecified osteoarthritis, unspecified site: Secondary | ICD-10-CM

## 2014-04-09 LAB — CBC WITH DIFFERENTIAL/PLATELET
BASOS ABS: 0 10*3/uL (ref 0.0–0.1)
Basophils Relative: 0.5 % (ref 0.0–3.0)
EOS PCT: 4.7 % (ref 0.0–5.0)
Eosinophils Absolute: 0.2 10*3/uL (ref 0.0–0.7)
HEMATOCRIT: 41.8 % (ref 39.0–52.0)
Hemoglobin: 13.7 g/dL (ref 13.0–17.0)
LYMPHS ABS: 0.8 10*3/uL (ref 0.7–4.0)
LYMPHS PCT: 23.1 % (ref 12.0–46.0)
MCHC: 32.9 g/dL (ref 30.0–36.0)
MCV: 99.3 fl (ref 78.0–100.0)
MONOS PCT: 11.1 % (ref 3.0–12.0)
Monocytes Absolute: 0.4 10*3/uL (ref 0.1–1.0)
Neutro Abs: 2.2 10*3/uL (ref 1.4–7.7)
Neutrophils Relative %: 60.6 % (ref 43.0–77.0)
Platelets: 165 10*3/uL (ref 150.0–400.0)
RBC: 4.2 Mil/uL — ABNORMAL LOW (ref 4.22–5.81)
RDW: 13.8 % (ref 11.5–15.5)
WBC: 3.6 10*3/uL — AB (ref 4.0–10.5)

## 2014-04-09 LAB — BASIC METABOLIC PANEL
BUN: 27 mg/dL — AB (ref 6–23)
CO2: 24 meq/L (ref 19–32)
Calcium: 9.9 mg/dL (ref 8.4–10.5)
Chloride: 106 mEq/L (ref 96–112)
Creatinine, Ser: 2.4 mg/dL — ABNORMAL HIGH (ref 0.4–1.5)
GFR: 34.59 mL/min — AB (ref >60.00)
GLUCOSE: 103 mg/dL — AB (ref 70–99)
Potassium: 4.9 mEq/L (ref 3.5–5.1)
Sodium: 140 mEq/L (ref 135–145)

## 2014-04-09 LAB — LIPID PANEL
CHOLESTEROL: 125 mg/dL (ref 0–200)
HDL: 54.9 mg/dL (ref >39.00)
LDL CALC: 63 mg/dL (ref 0–99)
NonHDL: 70.1
TRIGLYCERIDES: 36 mg/dL (ref 0.0–149.0)
Total CHOL/HDL Ratio: 2
VLDL: 7.2 mg/dL (ref 0.0–40.0)

## 2014-04-09 LAB — PSA: PSA: 1.39 ng/mL (ref 0.10–4.00)

## 2014-04-09 LAB — TSH: TSH: 2.59 u[IU]/mL (ref 0.35–4.50)

## 2014-04-09 LAB — HEPATIC FUNCTION PANEL
ALBUMIN: 4 g/dL (ref 3.5–5.2)
ALK PHOS: 40 U/L (ref 39–117)
ALT: 21 U/L (ref 0–53)
AST: 24 U/L (ref 0–37)
Bilirubin, Direct: 0.1 mg/dL (ref 0.0–0.3)
TOTAL PROTEIN: 7.1 g/dL (ref 6.0–8.3)
Total Bilirubin: 0.6 mg/dL (ref 0.2–1.2)

## 2014-04-09 LAB — HEMOGLOBIN A1C: HEMOGLOBIN A1C: 6 % (ref 4.6–6.5)

## 2014-04-09 NOTE — Patient Instructions (Signed)
Today we updated your med list in our EPIC system...    Continue your current medications the same...  For your heartburn>     Avoid spicey & greasy foods...    Take the OTC PRILOSEC 20mg  as needed...   Today we did your follow up FASTING blood work...    We will contact you w/ the results when available...   Keep up the good work w/ diet & exercise...  Call for any questions...  Let's plan a follow up visit in 90mo, sooner if needed for problems.Marland KitchenMarland Kitchen

## 2014-04-09 NOTE — Progress Notes (Deleted)
Subjective:     Patient ID: Greg [name], male   DOB: [birth year], 80 y.o.   MRN: [id number]  HPI   Review of Systems     Objective:   Physical Exam     Assessment:     ***    Plan:     ***
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
## 2014-04-27 ENCOUNTER — Other Ambulatory Visit: Payer: Self-pay | Admitting: Pulmonary Disease

## 2014-05-21 ENCOUNTER — Telehealth: Payer: Self-pay | Admitting: Pulmonary Disease

## 2014-05-21 NOTE — Telephone Encounter (Signed)
Called pt. appt scheduled for him to come in Monday at 2 pm to get flu shot. nothignf urther needed

## 2014-05-27 ENCOUNTER — Ambulatory Visit (INDEPENDENT_AMBULATORY_CARE_PROVIDER_SITE_OTHER): Payer: Medicare Other

## 2014-05-27 ENCOUNTER — Other Ambulatory Visit: Payer: Self-pay | Admitting: Pulmonary Disease

## 2014-05-27 DIAGNOSIS — Z23 Encounter for immunization: Secondary | ICD-10-CM

## 2014-06-13 ENCOUNTER — Encounter: Payer: Self-pay | Admitting: Internal Medicine

## 2014-09-12 DIAGNOSIS — E291 Testicular hypofunction: Secondary | ICD-10-CM | POA: Diagnosis not present

## 2014-09-26 DIAGNOSIS — E291 Testicular hypofunction: Secondary | ICD-10-CM | POA: Diagnosis not present

## 2014-10-07 ENCOUNTER — Encounter: Payer: Self-pay | Admitting: Pulmonary Disease

## 2014-10-07 ENCOUNTER — Ambulatory Visit (INDEPENDENT_AMBULATORY_CARE_PROVIDER_SITE_OTHER): Payer: Medicare Other | Admitting: Pulmonary Disease

## 2014-10-07 ENCOUNTER — Other Ambulatory Visit (INDEPENDENT_AMBULATORY_CARE_PROVIDER_SITE_OTHER): Payer: Medicare Other

## 2014-10-07 VITALS — BP 142/80 | HR 60 | Temp 97.3°F | Ht 68.0 in | Wt 183.4 lb

## 2014-10-07 DIAGNOSIS — E7800 Pure hypercholesterolemia, unspecified: Secondary | ICD-10-CM

## 2014-10-07 DIAGNOSIS — M159 Polyosteoarthritis, unspecified: Secondary | ICD-10-CM

## 2014-10-07 DIAGNOSIS — I872 Venous insufficiency (chronic) (peripheral): Secondary | ICD-10-CM

## 2014-10-07 DIAGNOSIS — N259 Disorder resulting from impaired renal tubular function, unspecified: Secondary | ICD-10-CM

## 2014-10-07 DIAGNOSIS — K2100 Gastro-esophageal reflux disease with esophagitis, without bleeding: Secondary | ICD-10-CM

## 2014-10-07 DIAGNOSIS — I10 Essential (primary) hypertension: Secondary | ICD-10-CM

## 2014-10-07 DIAGNOSIS — N138 Other obstructive and reflux uropathy: Secondary | ICD-10-CM

## 2014-10-07 DIAGNOSIS — N401 Enlarged prostate with lower urinary tract symptoms: Secondary | ICD-10-CM

## 2014-10-07 DIAGNOSIS — F419 Anxiety disorder, unspecified: Secondary | ICD-10-CM

## 2014-10-07 DIAGNOSIS — D126 Benign neoplasm of colon, unspecified: Secondary | ICD-10-CM | POA: Diagnosis not present

## 2014-10-07 DIAGNOSIS — M150 Primary generalized (osteo)arthritis: Secondary | ICD-10-CM

## 2014-10-07 DIAGNOSIS — E039 Hypothyroidism, unspecified: Secondary | ICD-10-CM

## 2014-10-07 LAB — BASIC METABOLIC PANEL
BUN: 28 mg/dL — ABNORMAL HIGH (ref 6–23)
CHLORIDE: 105 meq/L (ref 96–112)
CO2: 27 meq/L (ref 19–32)
CREATININE: 2.46 mg/dL — AB (ref 0.40–1.50)
Calcium: 9.5 mg/dL (ref 8.4–10.5)
GFR: 32.77 mL/min — AB (ref >60.00)
Glucose, Bld: 93 mg/dL (ref 70–99)
Potassium: 4.6 mEq/L (ref 3.5–5.1)
Sodium: 138 mEq/L (ref 135–145)

## 2014-10-07 LAB — TSH: TSH: 5.5 u[IU]/mL — AB (ref 0.35–4.50)

## 2014-10-07 NOTE — Patient Instructions (Signed)
Today we updated your med list in our EPIC system...    Continue your current medications the same...  Today we rechecked your blood chemistry & thyroid...    We will contact you w/ the results when available...   Let's get on track w/ our diet & exercise program...  Call for any questions...  Let's plan a follow up visit in 63mo, sooner if needed for problems.Marland KitchenMarland Kitchen
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
## 2014-10-08 ENCOUNTER — Ambulatory Visit: Payer: Medicare Other | Admitting: Pulmonary Disease

## 2014-10-10 DIAGNOSIS — E291 Testicular hypofunction: Secondary | ICD-10-CM | POA: Diagnosis not present

## 2014-10-24 DIAGNOSIS — E291 Testicular hypofunction: Secondary | ICD-10-CM | POA: Diagnosis not present

## 2014-10-31 ENCOUNTER — Telehealth: Payer: Self-pay | Admitting: Internal Medicine

## 2014-10-31 ENCOUNTER — Telehealth: Payer: Self-pay | Admitting: Pulmonary Disease

## 2014-10-31 DIAGNOSIS — K2100 Gastro-esophageal reflux disease with esophagitis, without bleeding: Secondary | ICD-10-CM

## 2014-10-31 DIAGNOSIS — N401 Enlarged prostate with lower urinary tract symptoms: Secondary | ICD-10-CM | POA: Diagnosis not present

## 2014-10-31 DIAGNOSIS — E291 Testicular hypofunction: Secondary | ICD-10-CM | POA: Diagnosis not present

## 2014-10-31 MED ORDER — ESOMEPRAZOLE MAGNESIUM 40 MG PO CPDR
40.0000 mg | DELAYED_RELEASE_CAPSULE | Freq: Two times a day (BID) | ORAL | Status: DC
Start: 2014-10-31 — End: 2015-10-02

## 2014-10-31 MED ORDER — PANTOPRAZOLE SODIUM 40 MG PO TBEC: 40.0000 mg | DELAYED_RELEASE_TABLET | Freq: Two times a day (BID) | ORAL | Status: DC

## 2014-10-31 NOTE — Telephone Encounter (Signed)
Pt scheduled to see Nicoletta Ba PA 11/01/14@2 :30pm. Rhonda to notify pt.

## 2014-10-31 NOTE — Telephone Encounter (Signed)
Pt states that he is still having trouble with heartburn, belching and food getting stuck.  He tried Prilosec for about at week but this didn't seem to help so he's not taking it now.  Pt reports that he ate a big meal Sunday night and woke up Monday morning and threw up and also had some diarrhea.  Pt states his food seems to be coming back up.  Please advise.

## 2014-10-31 NOTE — Telephone Encounter (Signed)
Per SN: needs GI appt ASAP.  In the meantime, needs a clear liquid diet and Protonix 40mg  1 po BID 35mins before breakfast and lunch (this is stronger than the Prilosec).  Pt saw GI 1.21.14 w/ Janett Billow Zehr/Dr Sharlett Iles. Order placed for the ASAP appt and rx sent if triage wouldn't mind calling pt.  Thanks!

## 2014-10-31 NOTE — Telephone Encounter (Signed)
Spoke with the pt and notified of recs per SN  He verbalized understanding  Rx for nexium was sent  Pt aware we will contact him asap for GI appt

## 2014-11-01 ENCOUNTER — Ambulatory Visit (INDEPENDENT_AMBULATORY_CARE_PROVIDER_SITE_OTHER): Payer: Medicare Other | Admitting: Physician Assistant

## 2014-11-01 ENCOUNTER — Encounter: Payer: Self-pay | Admitting: Physician Assistant

## 2014-11-01 VITALS — BP 136/70 | HR 72 | Ht 66.5 in | Wt 183.0 lb

## 2014-11-01 DIAGNOSIS — R1314 Dysphagia, pharyngoesophageal phase: Secondary | ICD-10-CM

## 2014-11-01 DIAGNOSIS — K2100 Gastro-esophageal reflux disease with esophagitis, without bleeding: Secondary | ICD-10-CM

## 2014-11-01 DIAGNOSIS — R1310 Dysphagia, unspecified: Secondary | ICD-10-CM | POA: Diagnosis not present

## 2014-11-01 NOTE — Progress Notes (Signed)
Patient ID: Greg [name], male   DOB: 1933/06/06, 81 y.o.   MRN: [id number]   Subjective:    Patient ID: Greg [name], male    DOB: 14-Oct-1932, 81 y.o.   MRN: [id number]  HPI Duayne is a pleasant 81 year old African-American male known to Dr. Henrene Pastor. He underwent colonoscopy in 2013 and this was a normal exam. He has history of GERD, hypertension, glaucoma, hyperlipidemia, chronic renal insufficiency and anemia of chronic disease. He comes in today on the advice of Dr. Lenna Gilford L. He had been on proton X which she says she takes as needed for heartburn. He says this past weekend he had developed vomiting on Sunday evening after he ate at the [employer]. He says he had some fairly hard retching. On Monday ,Tuesday, and Wednesday of this week he felt like his food was sticking in his chest and she noted painful swallowing and soreness in his substernal area. Yesterday he ate breakfast and then says several hours later he belched and he actually brought all the food back up that he had eaten for breakfast as if it had never made it  to his stomach. Since then has been eating primarily liquids and this morning had some soup and crackers which she says went down without difficulty. No complaints of abdominal pain or nausea. He was called in Nexium to take twice daily and has done that just since yesterday. He denies any intermittent problems with dysphagia or odynophagia over the past few months and says he was fine until he did all the vomiting this past Sunday.  Review of Systems Pertinent positive and negative review of systems were noted in the above HPI section.  All other review of systems was otherwise negative.  Outpatient Encounter Prescriptions as of 11/01/2014  Medication Sig  . aspirin 325 MG tablet Take 325 mg by mouth daily.   [name] esomeprazole (NEXIUM) 40 MG capsule Take 1 capsule (40 mg total) by mouth 2 (two) times daily before a meal.  . labetalol (NORMODYNE) 200 MG tablet take 2 tablets by mouth  twice a day  . levothyroxine (SYNTHROID, LEVOTHROID) 50 MCG tablet take 1 tablet by mouth once daily BEFORE BREAKFAST  . pantoprazole (PROTONIX) 40 MG tablet Take 1 tablet (40 mg total) by mouth 2 (two) times daily before a meal.  . simvastatin (ZOCOR) 40 MG tablet take 1 tablet by mouth at bedtime  . Tamsulosin HCl (FLOMAX) 0.4 MG CAPS Take 0.4 mg by mouth daily.     Allergies  Allergen Reactions  . Lisinopril     REACTION: INTOL to all ACE's  . Valsartan     REACTION: INTOL to all ARB's   Patient Active Problem List   Diagnosis Date Noted  . Hypothyroidism 10/07/2014  . Anemia of chronic disease 04/09/2014  . S/P TKR (total knee replacement) 04/03/2013  . BPH (benign prostatic hypertrophy) with urinary obstruction 07/20/2011  . Hypogonadism, male 01/11/2011  . BACK PAIN, LUMBAR 07/13/2010  . HYPERCHOLESTEROLEMIA 01/06/2008  . COLONIC POLYPS 01/03/2008  . DIZZINESS 01/03/2008  . DIABETES MELLITUS, BORDERLINE 01/03/2008  . ANXIETY 07/11/2007  . GLAUCOMA 07/11/2007  . Essential hypertension 07/11/2007  . Venous (peripheral) insufficiency 07/11/2007  . GERD 07/11/2007  . Disorder resulting from impaired renal function 07/11/2007  . Osteoarthritis 07/11/2007   History   Social History  . Marital Status: Widowed    Spouse Name: N/A  . Number of Children: 1  . Years of Education: N/A   Occupational History  .  Not on file.   Social History Main Topics  . Smoking status: Former Smoker -- 0.50 packs/day for 20 years    Types: Cigarettes    Quit date: 08/30/1978  . Smokeless tobacco: Never Used  . Alcohol Use: No     Comment: quit in 1973  . Drug Use: No  . Sexual Activity: Not Currently   Other Topics Concern  . Not on file   Social History Narrative    [name] family history is negative for Colon cancer, Esophageal cancer, Rectal cancer, and Stomach cancer.      Objective:    Filed Vitals:   11/01/14 1425  BP: 136/70  Pulse: 72    Physical Exam   well-developed older African-American male in no acute distress, pleasant blood pressure 136/70 pulse 72 height 5 foot 6 weight 183. He is hard of hearing. HEENT; nontraumatic normocephalic EOMI PERRLA sclera anicteric, Supple ;no JVD, Cardiovascular; regular rate and rhythm with S1-S2 no murmur or gallop, Pulmonary; clear bilaterally and tender nondistended bowel sounds are active there is no palpable mass or hepatosplenomegaly, Rectal; exam not done, Extremities; no clubbing cyanosis or edema skin warm and dry, Psych ;mood and affect appropriate       Assessment & Plan:   #1  81 yo male with acute onset of dysphagia and odynophagia after a prolonged episode of vomiting several days ago. Rule out acute esophagitis, rule out underlying esophageal stricture, rule out malignancy. #2 GERD #3 HTN #4 CKD  Plan; Discussed options with pt regarding barium swallow vs EGD. Will proceed with barium swallow with Tablet first , then EGD if indicated Continue Nexium 40 mg twice daily x one month Full liquid to very soft diet over next 48 hours ,gradual  advancement   Amy S Esterwood PA-C 11/01/2014   Cc: Noralee Space, MD

## 2014-11-01 NOTE — Patient Instructions (Signed)
Take Nexium 40 mg by mouth twice daily for 1 month, then go back to once daily. Eat a full liquid to very soft diet over the next 3-4 days.  You have been scheduled for a Barium Esophogram at [facility] (1st floor of the hospital) on 11-06-2014 at 10:30 am . Please arrive at 10:15 am prior to your appointment for registration. Make certain not to have anything to eat or drink 3 hours prior to your test. If you need to reschedule for any reason, please contact radiology at [phone] to do so. __________________________________________________________________ A barium swallow is an examination that concentrates on views of the esophagus. This tends to be a double contrast exam (barium and two liquids which, when combined, create a gas to distend the wall of the oesophagus) or single contrast (non-ionic iodine based). The study is usually tailored to your symptoms so a good history is essential. Attention is paid during the study to the form, structure and configuration of the esophagus, looking for functional disorders (such as aspiration, dysphagia, achalasia, motility and reflux) EXAMINATION You may be asked to change into a gown, depending on the type of swallow being performed. A radiologist and radiographer will perform the procedure. The radiologist will advise you of the type of contrast selected for your procedure and direct you during the exam. You will be asked to stand, sit or lie in several different positions and to hold a small amount of fluid in your mouth before being asked to swallow while the imaging is performed .In some instances you may be asked to swallow barium coated marshmallows to assess the motility of a solid food bolus. The exam can be recorded as a digital or video fluoroscopy procedure. POST PROCEDURE It will take 1-2 days for the barium to pass through your system. To facilitate this, it is important, unless otherwise directed, to increase your fluids for the next  24-48hrs and to resume your normal diet.  This test typically takes about 30 minutes to perform. __________________________________________________________________________________

## 2014-11-02 NOTE — Progress Notes (Signed)
Agree with contrast swallowing study as next step

## 2014-11-06 ENCOUNTER — Ambulatory Visit (HOSPITAL_COMMUNITY): Payer: Medicare Other

## 2014-11-06 ENCOUNTER — Ambulatory Visit: Payer: Medicare Other | Admitting: Gastroenterology

## 2014-11-08 DIAGNOSIS — E291 Testicular hypofunction: Secondary | ICD-10-CM | POA: Diagnosis not present

## 2014-11-08 DIAGNOSIS — N138 Other obstructive and reflux uropathy: Secondary | ICD-10-CM | POA: Diagnosis not present

## 2014-11-08 DIAGNOSIS — N401 Enlarged prostate with lower urinary tract symptoms: Secondary | ICD-10-CM | POA: Diagnosis not present

## 2014-11-20 ENCOUNTER — Ambulatory Visit (INDEPENDENT_AMBULATORY_CARE_PROVIDER_SITE_OTHER): Payer: Medicare Other | Admitting: Ophthalmology

## 2014-11-20 DIAGNOSIS — I10 Essential (primary) hypertension: Secondary | ICD-10-CM | POA: Diagnosis not present

## 2014-11-20 DIAGNOSIS — H43812 Vitreous degeneration, left eye: Secondary | ICD-10-CM | POA: Diagnosis not present

## 2014-11-20 DIAGNOSIS — H2512 Age-related nuclear cataract, left eye: Secondary | ICD-10-CM

## 2014-11-20 DIAGNOSIS — H35032 Hypertensive retinopathy, left eye: Secondary | ICD-10-CM | POA: Diagnosis not present

## 2014-11-20 DIAGNOSIS — H3531 Nonexudative age-related macular degeneration: Secondary | ICD-10-CM

## 2014-11-21 DIAGNOSIS — E291 Testicular hypofunction: Secondary | ICD-10-CM | POA: Diagnosis not present

## 2014-11-28 ENCOUNTER — Telehealth: Payer: Self-pay | Admitting: Pulmonary Disease

## 2014-11-28 MED ORDER — LABETALOL HCL 200 MG PO TABS: 400.0000 mg | ORAL_TABLET | Freq: Two times a day (BID) | ORAL | Status: DC

## 2014-11-28 NOTE — Telephone Encounter (Signed)
lmomtcb x1 

## 2014-11-28 NOTE — Telephone Encounter (Signed)
Spoke with the pt  I have refilled the labetolol for him  Nothing further needed

## 2014-11-29 ENCOUNTER — Telehealth: Payer: Self-pay | Admitting: Pulmonary Disease

## 2014-11-29 NOTE — Telephone Encounter (Signed)
I sent the rx yesterday  Spoke with pharmacist, Ronalee Belts, at [facility] and confirmed that rx was received and is ready to be picked up  Pt aware  Nothing further needed

## 2014-12-06 DIAGNOSIS — E291 Testicular hypofunction: Secondary | ICD-10-CM | POA: Diagnosis not present

## 2014-12-19 DIAGNOSIS — E291 Testicular hypofunction: Secondary | ICD-10-CM | POA: Diagnosis not present

## 2014-12-26 ENCOUNTER — Other Ambulatory Visit: Payer: Self-pay | Admitting: Pulmonary Disease

## 2015-01-02 DIAGNOSIS — E291 Testicular hypofunction: Secondary | ICD-10-CM | POA: Diagnosis not present

## 2015-01-16 DIAGNOSIS — E291 Testicular hypofunction: Secondary | ICD-10-CM | POA: Diagnosis not present

## 2015-01-30 DIAGNOSIS — E291 Testicular hypofunction: Secondary | ICD-10-CM | POA: Diagnosis not present

## 2015-02-13 DIAGNOSIS — E291 Testicular hypofunction: Secondary | ICD-10-CM | POA: Diagnosis not present

## 2015-02-26 DIAGNOSIS — E291 Testicular hypofunction: Secondary | ICD-10-CM | POA: Diagnosis not present

## 2015-03-10 DIAGNOSIS — R109 Unspecified abdominal pain: Secondary | ICD-10-CM | POA: Diagnosis not present

## 2015-03-13 DIAGNOSIS — E291 Testicular hypofunction: Secondary | ICD-10-CM | POA: Diagnosis not present

## 2015-03-27 DIAGNOSIS — E291 Testicular hypofunction: Secondary | ICD-10-CM | POA: Diagnosis not present

## 2015-04-10 ENCOUNTER — Other Ambulatory Visit: Payer: Self-pay | Admitting: Pulmonary Disease

## 2015-04-10 DIAGNOSIS — E291 Testicular hypofunction: Secondary | ICD-10-CM | POA: Diagnosis not present

## 2015-04-24 DIAGNOSIS — E291 Testicular hypofunction: Secondary | ICD-10-CM | POA: Diagnosis not present

## 2015-05-08 DIAGNOSIS — E291 Testicular hypofunction: Secondary | ICD-10-CM | POA: Diagnosis not present

## 2015-05-22 DIAGNOSIS — E291 Testicular hypofunction: Secondary | ICD-10-CM | POA: Diagnosis not present

## 2015-05-26 ENCOUNTER — Ambulatory Visit (INDEPENDENT_AMBULATORY_CARE_PROVIDER_SITE_OTHER): Payer: Medicare Other

## 2015-05-26 DIAGNOSIS — Z23 Encounter for immunization: Secondary | ICD-10-CM

## 2015-05-27 DIAGNOSIS — Z23 Encounter for immunization: Secondary | ICD-10-CM | POA: Diagnosis not present

## 2015-06-05 DIAGNOSIS — E291 Testicular hypofunction: Secondary | ICD-10-CM | POA: Diagnosis not present

## 2015-06-19 DIAGNOSIS — E291 Testicular hypofunction: Secondary | ICD-10-CM | POA: Diagnosis not present

## 2015-06-23 ENCOUNTER — Other Ambulatory Visit: Payer: Self-pay | Admitting: Pulmonary Disease

## 2015-07-03 DIAGNOSIS — E291 Testicular hypofunction: Secondary | ICD-10-CM | POA: Diagnosis not present

## 2015-07-07 ENCOUNTER — Other Ambulatory Visit: Payer: Self-pay | Admitting: Pulmonary Disease

## 2015-07-18 ENCOUNTER — Telehealth: Payer: Self-pay | Admitting: Pulmonary Disease

## 2015-07-18 NOTE — Telephone Encounter (Signed)
PT HAD TO LEAVE, PLEASE call him at  [phone]

## 2015-07-18 NOTE — Telephone Encounter (Signed)
Patient came by the office, patient was about to leave, but I caught him at the elevator before he left.  Patient says that Dr. Lenna Gilford put him on some vitamins, Protegra and Centrum.  He says that he was getting these vitamins at [facility], but they no longer carry it at [facility], so he started getting it at [facility], however, they have been out of the vitamins x 1 week.  He wants to know if Dr. Lenna Gilford can recommend alternative to these vitamins.  Dr. Lenna Gilford, please advise.
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
## 2015-07-22 NOTE — Telephone Encounter (Signed)
Pt aware of rec's per SN. Nothing further needed.

## 2015-08-01 ENCOUNTER — Other Ambulatory Visit: Payer: Self-pay | Admitting: Pulmonary Disease

## 2015-10-02 ENCOUNTER — Telehealth: Payer: Self-pay | Admitting: Pulmonary Disease

## 2015-10-02 MED ORDER — DICYCLOMINE HCL 20 MG PO TABS: 20.0000 mg | ORAL_TABLET | Freq: Three times a day (TID) | ORAL | Status: DC | PRN

## 2015-10-02 MED ORDER — ESOMEPRAZOLE MAGNESIUM 40 MG PO CPDR: 40.0000 mg | DELAYED_RELEASE_CAPSULE | Freq: Two times a day (BID) | ORAL | Status: DC

## 2015-10-02 NOTE — Telephone Encounter (Signed)
Patient says that his stomach is bothering him.  He is sore across the stomach right above naval and anytime he eats his stomach will tighten up.  No problems with bowel movements.  Does not notice any swelling in stomach, doesn't feel hard to touch.  Patient has appointment with Dr. Lenna Gilford on 2/14 and he wanted to know if he should come in earlier.  Allergies  Allergen Reactions  . Lisinopril     REACTION: INTOL to all ACE's  . Valsartan     REACTION: INTOL to all ARB's   Current Outpatient Prescriptions on File Prior to Visit  Medication Sig Dispense Refill  . aspirin 325 MG tablet Take 325 mg by mouth daily.     [name] esomeprazole (NEXIUM) 40 MG capsule Take 1 capsule (40 mg total) by mouth 2 (two) times daily before a meal. 60 capsule 1  . labetalol (NORMODYNE) 200 MG tablet Take 2 tablets (400 mg total) by mouth 2 (two) times daily. 120 tablet 11  . levothyroxine (SYNTHROID, LEVOTHROID) 50 MCG tablet take 1 tablet by mouth once daily BEFORE BREAKFAST 30 tablet 6  . pantoprazole (PROTONIX) 40 MG tablet take 1 tablet by mouth twice a day before meals 60 tablet 0  . simvastatin (ZOCOR) 40 MG tablet take 1 tablet by mouth at bedtime 30 tablet 2  . Tamsulosin HCl (FLOMAX) 0.4 MG CAPS Take 0.4 mg by mouth daily.       No current facility-administered medications on file prior to visit.

## 2015-10-02 NOTE — Telephone Encounter (Signed)
Per SN:  Continue Nexium 40mg  PO BID before breakfast and dinner, keep 2/14 appt with SN, call in Bentyl 20mg  #50 1 PO TID prn for abdominal discomfort.    Called and spoke to pt. Informed him of the recs per SN. Nexium and Bentyl both sent to preferred pharmacy. Pt verbalized understanding and denied any further questions or concerns at this time.

## 2015-10-13 DIAGNOSIS — M25552 Pain in left hip: Secondary | ICD-10-CM | POA: Diagnosis not present

## 2015-10-13 DIAGNOSIS — M4806 Spinal stenosis, lumbar region: Secondary | ICD-10-CM | POA: Diagnosis not present

## 2015-10-14 ENCOUNTER — Ambulatory Visit (INDEPENDENT_AMBULATORY_CARE_PROVIDER_SITE_OTHER): Payer: Medicare Other | Admitting: Pulmonary Disease

## 2015-10-14 ENCOUNTER — Telehealth: Payer: Self-pay | Admitting: Pulmonary Disease

## 2015-10-14 ENCOUNTER — Encounter: Payer: Self-pay | Admitting: Pulmonary Disease

## 2015-10-14 ENCOUNTER — Other Ambulatory Visit (INDEPENDENT_AMBULATORY_CARE_PROVIDER_SITE_OTHER): Payer: Medicare Other

## 2015-10-14 ENCOUNTER — Ambulatory Visit (INDEPENDENT_AMBULATORY_CARE_PROVIDER_SITE_OTHER)
Admission: RE | Admit: 2015-10-14 | Discharge: 2015-10-14 | Disposition: A | Discharge status: Home / Self Care | Payer: Medicare Other | Source: Ambulatory Visit | Attending: Pulmonary Disease | Admitting: Pulmonary Disease

## 2015-10-14 VITALS — BP 130/88 | HR 60 | Temp 97.5°F | Ht 66.5 in | Wt 186.2 lb

## 2015-10-14 DIAGNOSIS — E7800 Pure hypercholesterolemia, unspecified: Secondary | ICD-10-CM | POA: Diagnosis not present

## 2015-10-14 DIAGNOSIS — E039 Hypothyroidism, unspecified: Secondary | ICD-10-CM | POA: Diagnosis not present

## 2015-10-14 DIAGNOSIS — N401 Enlarged prostate with lower urinary tract symptoms: Secondary | ICD-10-CM

## 2015-10-14 DIAGNOSIS — M159 Polyosteoarthritis, unspecified: Secondary | ICD-10-CM

## 2015-10-14 DIAGNOSIS — I872 Venous insufficiency (chronic) (peripheral): Secondary | ICD-10-CM

## 2015-10-14 DIAGNOSIS — N259 Disorder resulting from impaired renal tubular function, unspecified: Secondary | ICD-10-CM

## 2015-10-14 DIAGNOSIS — K219 Gastro-esophageal reflux disease without esophagitis: Secondary | ICD-10-CM

## 2015-10-14 DIAGNOSIS — N138 Other obstructive and reflux uropathy: Secondary | ICD-10-CM

## 2015-10-14 DIAGNOSIS — I10 Essential (primary) hypertension: Secondary | ICD-10-CM

## 2015-10-14 DIAGNOSIS — D126 Benign neoplasm of colon, unspecified: Secondary | ICD-10-CM

## 2015-10-14 DIAGNOSIS — M150 Primary generalized (osteo)arthritis: Secondary | ICD-10-CM

## 2015-10-14 DIAGNOSIS — S21301A Unspecified open wound of right front wall of thorax with penetration into thoracic cavity, initial encounter: Secondary | ICD-10-CM | POA: Diagnosis not present

## 2015-10-14 LAB — CBC WITH DIFFERENTIAL/PLATELET
BASOS ABS: 0 10*3/uL (ref 0.0–0.1)
BASOS PCT: 0.5 % (ref 0.0–3.0)
EOS ABS: 0.1 10*3/uL (ref 0.0–0.7)
Eosinophils Relative: 3.3 % (ref 0.0–5.0)
HEMATOCRIT: 33 % — AB (ref 39.0–52.0)
Hemoglobin: 11.2 g/dL — ABNORMAL LOW (ref 13.0–17.0)
LYMPHS ABS: 1.1 10*3/uL (ref 0.7–4.0)
Lymphocytes Relative: 28.5 % (ref 12.0–46.0)
MCHC: 33.9 g/dL (ref 30.0–36.0)
MCV: 95.1 fl (ref 78.0–100.0)
MONO ABS: 0.4 10*3/uL (ref 0.1–1.0)
Monocytes Relative: 11.6 % (ref 3.0–12.0)
NEUTROS ABS: 2.1 10*3/uL (ref 1.4–7.7)
NEUTROS PCT: 56.1 % (ref 43.0–77.0)
PLATELETS: 161 10*3/uL (ref 150.0–400.0)
RBC: 3.47 Mil/uL — ABNORMAL LOW (ref 4.22–5.81)
RDW: 14.2 % (ref 11.5–15.5)
WBC: 3.7 10*3/uL — ABNORMAL LOW (ref 4.0–10.5)

## 2015-10-14 LAB — HEPATIC FUNCTION PANEL
ALT: 28 U/L (ref 0–53)
AST: 24 U/L (ref 0–37)
Albumin: 4.2 g/dL (ref 3.5–5.2)
Alkaline Phosphatase: 45 U/L (ref 39–117)
BILIRUBIN TOTAL: 0.4 mg/dL (ref 0.2–1.2)
Bilirubin, Direct: 0 mg/dL (ref 0.0–0.3)
Total Protein: 7 g/dL (ref 6.0–8.3)

## 2015-10-14 LAB — LIPID PANEL
CHOL/HDL RATIO: 2
Cholesterol: 182 mg/dL (ref 0–200)
HDL: 75.5 mg/dL (ref >39.00)
LDL CALC: 94 mg/dL (ref 0–99)
NONHDL: 106.68
TRIGLYCERIDES: 62 mg/dL (ref 0.0–149.0)
VLDL: 12.4 mg/dL (ref 0.0–40.0)

## 2015-10-14 LAB — BASIC METABOLIC PANEL
BUN: 28 mg/dL — AB (ref 6–23)
CO2: 26 mEq/L (ref 19–32)
Calcium: 9.8 mg/dL (ref 8.4–10.5)
Chloride: 107 mEq/L (ref 96–112)
Creatinine, Ser: 2.24 mg/dL — ABNORMAL HIGH (ref 0.40–1.50)
GFR: 36.42 mL/min — AB (ref >60.00)
Glucose, Bld: 112 mg/dL — ABNORMAL HIGH (ref 70–99)
POTASSIUM: 4.8 meq/L (ref 3.5–5.1)
SODIUM: 141 meq/L (ref 135–145)

## 2015-10-14 LAB — PSA: PSA: 0.9 ng/mL (ref 0.10–4.00)

## 2015-10-14 LAB — TSH: TSH: 4.03 u[IU]/mL (ref 0.35–4.50)

## 2015-10-14 MED ORDER — SIMVASTATIN 40 MG PO TABS: 40.0000 mg | ORAL_TABLET | Freq: Every day | ORAL | Status: DC
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
## 2015-10-14 NOTE — Telephone Encounter (Signed)
Called spoke with pt. Aware simvastatin has been sent in. Nothing further needed

## 2015-10-14 NOTE — Patient Instructions (Signed)
Today we updated your med list in our EPIC system...    Continue your current medications the same...    We refilled your meds per request...  When your NEXIUM runs out- return to the [place] (Pantoprazole) 40mg  twice daily...  Today we checked a follow up CXR, EKG, & FASTING blood work...    We will contact you w/ the results when available...   Be sure to follow a low carb/ low fat diet. Increase your exercise program, and work on weight reduction...  Call for any questions...  Let's plan a follow up visit in 66mo, sooner if needed for problems.Marland KitchenMarland Kitchen

## 2015-10-15 NOTE — Progress Notes (Signed)
Quick Note:  Called and spoke to pt. Informed him of the results and recs per SN. Pt verbalized understanding and denied any further questions or concerns at this time.   ______ 

## 2015-11-07 ENCOUNTER — Telehealth: Payer: Self-pay | Admitting: Pulmonary Disease

## 2015-11-07 MED ORDER — POLYSACCHARIDE IRON COMPLEX 150 MG PO CAPS: 150.0000 mg | ORAL_CAPSULE | Freq: Two times a day (BID) | ORAL | Status: DC

## 2015-11-07 NOTE — Telephone Encounter (Signed)
Per Dr. Lenna Gilford:  Change Iron to Niferex - 1 tablet BID, #60  -------------------- Patient notified of Dr. Jeannine Kitten recommendation.  Rx sent to pharmacy. Nothing further needed.

## 2015-11-07 NOTE — Telephone Encounter (Signed)
Patient has had dizziness every since he began the iron tablets on 10/14/15.  No dark stools.  Just feeling dizzy when he gets up to walk around.  Patient states he is taking 2 blood pressure pills twice daily and taking iron tablets once daily.  He is not sure if this has anything to do with his dizziness.  Wants to know if he should stop taking the iron tablets?    Dr. Lenna Gilford, please advise.  Current Outpatient Prescriptions on File Prior to Visit  Medication Sig Dispense Refill  . aspirin 325 MG tablet Take 325 mg by mouth daily.     [name] dicyclomine (BENTYL) 20 MG tablet Take 1 tablet (20 mg total) by mouth 3 (three) times daily as needed (abdominal discomfort). 50 tablet 0  . labetalol (NORMODYNE) 200 MG tablet Take 2 tablets (400 mg total) by mouth 2 (two) times daily. 120 tablet 11  . levothyroxine (SYNTHROID, LEVOTHROID) 50 MCG tablet take 1 tablet by mouth once daily BEFORE BREAKFAST 30 tablet 6  . pantoprazole (PROTONIX) 40 MG tablet take 1 tablet by mouth twice a day before meals 60 tablet 0  . simvastatin (ZOCOR) 40 MG tablet Take 1 tablet (40 mg total) by mouth at bedtime. 30 tablet 2  . Tamsulosin HCl (FLOMAX) 0.4 MG CAPS Take 0.4 mg by mouth daily.       No current facility-administered medications on file prior to visit.   Allergies  Allergen Reactions  . Lisinopril     REACTION: INTOL to all ACE's  . Valsartan     REACTION: INTOL to all ARB's

## 2015-11-21 ENCOUNTER — Ambulatory Visit (INDEPENDENT_AMBULATORY_CARE_PROVIDER_SITE_OTHER): Payer: Medicare Other | Admitting: Ophthalmology

## 2015-11-25 DIAGNOSIS — M4806 Spinal stenosis, lumbar region: Secondary | ICD-10-CM | POA: Diagnosis not present

## 2015-11-25 DIAGNOSIS — M25552 Pain in left hip: Secondary | ICD-10-CM | POA: Diagnosis not present

## 2015-11-26 ENCOUNTER — Other Ambulatory Visit: Payer: Self-pay | Admitting: Emergency Medicine

## 2015-11-26 MED ORDER — LABETALOL HCL 200 MG PO TABS: 400.0000 mg | ORAL_TABLET | Freq: Two times a day (BID) | ORAL | Status: DC

## 2015-11-27 ENCOUNTER — Ambulatory Visit (INDEPENDENT_AMBULATORY_CARE_PROVIDER_SITE_OTHER): Payer: Medicare Other | Admitting: Ophthalmology

## 2015-11-27 DIAGNOSIS — H353121 Nonexudative age-related macular degeneration, left eye, early dry stage: Secondary | ICD-10-CM | POA: Diagnosis not present

## 2015-11-27 DIAGNOSIS — H35033 Hypertensive retinopathy, bilateral: Secondary | ICD-10-CM

## 2015-11-27 DIAGNOSIS — I10 Essential (primary) hypertension: Secondary | ICD-10-CM | POA: Diagnosis not present

## 2015-11-27 DIAGNOSIS — D3132 Benign neoplasm of left choroid: Secondary | ICD-10-CM

## 2015-11-27 DIAGNOSIS — H43812 Vitreous degeneration, left eye: Secondary | ICD-10-CM | POA: Diagnosis not present

## 2015-11-27 DIAGNOSIS — H2512 Age-related nuclear cataract, left eye: Secondary | ICD-10-CM | POA: Diagnosis not present

## 2015-12-01 DIAGNOSIS — E291 Testicular hypofunction: Secondary | ICD-10-CM | POA: Diagnosis not present

## 2015-12-01 DIAGNOSIS — N401 Enlarged prostate with lower urinary tract symptoms: Secondary | ICD-10-CM | POA: Diagnosis not present

## 2015-12-08 DIAGNOSIS — E291 Testicular hypofunction: Secondary | ICD-10-CM | POA: Diagnosis not present

## 2015-12-08 DIAGNOSIS — Z0000 Encounter for general adult medical examination without abnormal findings: Secondary | ICD-10-CM | POA: Diagnosis not present

## 2015-12-17 ENCOUNTER — Telehealth: Payer: Self-pay | Admitting: Pulmonary Disease

## 2015-12-17 NOTE — Telephone Encounter (Signed)
Patient states that he has a lot of mucus in throat, coughing up clear mucus. When patient lays down, the mucus gathers in his throat and chokes him.    Pharmacy: [facility] [place]  Allergies  Allergen Reactions  . Lisinopril     REACTION: INTOL to all ACE's  . Valsartan     REACTION: INTOL to all ARB's   To VS in Dr. Brendolyn Patty absence.

## 2015-12-17 NOTE — Telephone Encounter (Signed)
He can try mucinex.  If he has post nasal drip he can try OTC nasacort.

## 2015-12-17 NOTE — Telephone Encounter (Signed)
Patient notified of Dr. Juanetta Gosling recommendations. Nothing further needed.

## 2016-01-05 ENCOUNTER — Telehealth: Payer: Self-pay | Admitting: Pulmonary Disease

## 2016-01-05 DIAGNOSIS — K625 Hemorrhage of anus and rectum: Secondary | ICD-10-CM

## 2016-01-05 MED ORDER — HYDROCORTISONE 2.5 % RE CREA: TOPICAL_CREAM | RECTAL | Status: DC

## 2016-01-05 NOTE — Telephone Encounter (Signed)
Per SN: Treat with Anusol HC cream, apply to rectal area after each BM and qhs. Also pt will need an appt with Dr. Henrene Pastor (GI) or PA.   Called and spoke to pt. Informed him of the recs per SN. Rx sent to preferred pharmacy and pt re-referred to Dr. Henrene Pastor as pt states it has been years since he has seen Dr. Henrene Pastor. Pt verbalized understanding and denied any further questions or concerns at this time.

## 2016-01-05 NOTE — Telephone Encounter (Signed)
Spoke with pt.  He reports that 2 times since last week he has noticed a small amount of blood on tissue after having a BM.  Pt denies noticing blood in stool ,or change in color or consistency of stool.  Pt also denies having h/o hemorrhoids, abd pain or discomfort.  He occas " feels something down in his intestines" but this goes away.  When reviewing meds with pt he doesn't think he is taking Bentyl or Pantoprazole at this time.  Please advise.

## 2016-01-13 ENCOUNTER — Other Ambulatory Visit: Payer: Self-pay | Admitting: *Deleted

## 2016-01-13 MED ORDER — SIMVASTATIN 40 MG PO TABS: 40.0000 mg | ORAL_TABLET | Freq: Every day | ORAL | Status: DC

## 2016-01-16 ENCOUNTER — Other Ambulatory Visit: Payer: Self-pay | Admitting: Pulmonary Disease

## 2016-01-16 MED ORDER — SIMVASTATIN 40 MG PO TABS: 40.0000 mg | ORAL_TABLET | Freq: Every day | ORAL | Status: DC

## 2016-01-16 NOTE — Telephone Encounter (Signed)
Received faxed refill requests from [facility] for Simvastatin 40mg  QHS #30   Rx okayed per [employer] signature  Rx sent electronically Will sign off

## 2016-02-17 ENCOUNTER — Ambulatory Visit (INDEPENDENT_AMBULATORY_CARE_PROVIDER_SITE_OTHER): Payer: Medicare Other | Admitting: Internal Medicine

## 2016-02-17 ENCOUNTER — Encounter: Payer: Self-pay | Admitting: Internal Medicine

## 2016-02-17 VITALS — BP 136/70 | HR 76 | Ht 66.5 in | Wt 181.0 lb

## 2016-02-17 DIAGNOSIS — K2100 Gastro-esophageal reflux disease with esophagitis, without bleeding: Secondary | ICD-10-CM

## 2016-02-17 DIAGNOSIS — K625 Hemorrhage of anus and rectum: Secondary | ICD-10-CM | POA: Diagnosis not present

## 2016-02-17 DIAGNOSIS — R1084 Generalized abdominal pain: Secondary | ICD-10-CM
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
## 2016-02-17 NOTE — Patient Instructions (Signed)
You have been scheduled for an abdominal ultrasound at [facility] (1st floor of hospital) on 02/20/2016 at 7:30am. Please arrive 15 minutes prior to your appointment for registration. Make certain not to have anything to eat or drink  after midnight prior to your appointment. Should you need to reschedule your appointment, please contact radiology at [phone]. This test typically takes about 30 minutes to perform.

## 2016-02-20 ENCOUNTER — Ambulatory Visit (HOSPITAL_COMMUNITY)
Admission: RE | Admit: 2016-02-20 | Discharge: 2016-02-20 | Disposition: A | Discharge status: Home / Self Care | Payer: Medicare Other | Source: Ambulatory Visit | Attending: Internal Medicine | Admitting: Internal Medicine

## 2016-02-20 ENCOUNTER — Other Ambulatory Visit: Payer: Self-pay

## 2016-02-20 DIAGNOSIS — R109 Unspecified abdominal pain: Secondary | ICD-10-CM | POA: Diagnosis not present

## 2016-02-20 DIAGNOSIS — N281 Cyst of kidney, acquired: Secondary | ICD-10-CM | POA: Insufficient documentation

## 2016-02-20 DIAGNOSIS — R1084 Generalized abdominal pain: Secondary | ICD-10-CM | POA: Diagnosis not present

## 2016-02-20 MED ORDER — OMEPRAZOLE 40 MG PO CPDR: 40.0000 mg | DELAYED_RELEASE_CAPSULE | Freq: Every day | ORAL | Status: DC

## 2016-03-05 ENCOUNTER — Other Ambulatory Visit: Payer: Self-pay | Admitting: Pulmonary Disease

## 2016-03-05 MED ORDER — LEVOTHYROXINE SODIUM 50 MCG PO TABS
ORAL_TABLET | ORAL | Status: DC
Start: 2016-03-05 — End: 2016-10-18

## 2016-03-05 NOTE — Telephone Encounter (Signed)
Received fax from [facility] for: Levothyroxine 58mcg QD #30 with 6 refills  Last ov 2.14.17 w/ SN Refills sent Nothing further needed; will sign off

## 2016-03-15 ENCOUNTER — Ambulatory Visit (INDEPENDENT_AMBULATORY_CARE_PROVIDER_SITE_OTHER): Payer: Medicare Other | Admitting: Pulmonary Disease

## 2016-03-15 ENCOUNTER — Encounter: Payer: Self-pay | Admitting: Pulmonary Disease

## 2016-03-15 VITALS — BP 140/76 | HR 55 | Temp 98.0°F | Ht 66.5 in | Wt 181.0 lb

## 2016-03-15 DIAGNOSIS — I10 Essential (primary) hypertension: Secondary | ICD-10-CM

## 2016-03-15 DIAGNOSIS — E7800 Pure hypercholesterolemia, unspecified: Secondary | ICD-10-CM

## 2016-03-15 DIAGNOSIS — N138 Other obstructive and reflux uropathy: Secondary | ICD-10-CM

## 2016-03-15 DIAGNOSIS — E039 Hypothyroidism, unspecified: Secondary | ICD-10-CM

## 2016-03-15 DIAGNOSIS — N401 Enlarged prostate with lower urinary tract symptoms: Secondary | ICD-10-CM

## 2016-03-15 DIAGNOSIS — M159 Polyosteoarthritis, unspecified: Secondary | ICD-10-CM

## 2016-03-15 DIAGNOSIS — R42 Dizziness and giddiness: Secondary | ICD-10-CM | POA: Diagnosis not present

## 2016-03-15 DIAGNOSIS — N259 Disorder resulting from impaired renal tubular function, unspecified: Secondary | ICD-10-CM

## 2016-03-15 DIAGNOSIS — I872 Venous insufficiency (chronic) (peripheral): Secondary | ICD-10-CM

## 2016-03-15 DIAGNOSIS — K219 Gastro-esophageal reflux disease without esophagitis: Secondary | ICD-10-CM

## 2016-03-15 DIAGNOSIS — M150 Primary generalized (osteo)arthritis: Secondary | ICD-10-CM

## 2016-03-15 DIAGNOSIS — D126 Benign neoplasm of colon, unspecified: Secondary | ICD-10-CM

## 2016-03-15 MED ORDER — METHYLPREDNISOLONE 4 MG PO TBPK: ORAL_TABLET | ORAL | Status: DC

## 2016-03-15 MED ORDER — MECLIZINE HCL 25 MG PO TABS: 25.0000 mg | ORAL_TABLET | Freq: Four times a day (QID) | ORAL | Status: DC | PRN

## 2016-03-15 NOTE — Patient Instructions (Signed)
Today we updated your med list in our EPIC system...    Continue your current medications the same...  For the dizziness>    We are prescribing a MEDROL dosepak for inflammation- take as directed on the pack...    And MECLIZINE 25mg  take 1 tab every 6H as needed for the dizziness...  We will set up an appt w/ [facility] for further evaluation of this persistent dizziness...  Call for any questions...  Let's plan a follow up visit in 4-76mo, sooner if needed for problems.Marland KitchenMarland Kitchen
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
## 2016-04-12 ENCOUNTER — Ambulatory Visit: Payer: Medicare Other | Admitting: Pulmonary Disease

## 2016-04-16 ENCOUNTER — Ambulatory Visit (INDEPENDENT_AMBULATORY_CARE_PROVIDER_SITE_OTHER): Payer: Medicare Other | Admitting: Neurology

## 2016-04-16 ENCOUNTER — Encounter: Payer: Self-pay | Admitting: Neurology

## 2016-04-16 VITALS — BP 128/63 | HR 87 | Ht 69.0 in | Wt 182.0 lb

## 2016-04-16 DIAGNOSIS — R55 Syncope and collapse: Secondary | ICD-10-CM

## 2016-04-16 NOTE — Patient Instructions (Signed)
It sounds like your dizziness may be heart-related.  I want to send you to cardiology for evaluation.  If they don't suspect it is from the heart, or if their testing is negative, then I would like to order imaging of your head.
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
## 2016-04-20 ENCOUNTER — Encounter: Payer: Self-pay | Admitting: Cardiovascular Disease

## 2016-04-20 ENCOUNTER — Ambulatory Visit (INDEPENDENT_AMBULATORY_CARE_PROVIDER_SITE_OTHER): Payer: Medicare Other | Admitting: Cardiovascular Disease

## 2016-04-20 ENCOUNTER — Encounter (INDEPENDENT_AMBULATORY_CARE_PROVIDER_SITE_OTHER): Payer: Self-pay

## 2016-04-20 VITALS — BP 132/66 | HR 74 | Ht 69.5 in | Wt 185.8 lb

## 2016-04-20 DIAGNOSIS — R002 Palpitations: Secondary | ICD-10-CM

## 2016-04-20 DIAGNOSIS — R55 Syncope and collapse: Secondary | ICD-10-CM

## 2016-04-20 LAB — CBC WITH DIFFERENTIAL/PLATELET
BASOS ABS: 35 {cells}/uL (ref 0–200)
BASOS PCT: 1 %
EOS ABS: 105 {cells}/uL (ref 15–500)
Eosinophils Relative: 3 %
HCT: 29 % — ABNORMAL LOW (ref 38.5–50.0)
HEMOGLOBIN: 10.1 g/dL — AB (ref 13.2–17.1)
LYMPHS ABS: 1085 {cells}/uL (ref 850–3900)
Lymphocytes Relative: 31 %
MCH: 32.5 pg (ref 27.0–33.0)
MCHC: 34.8 g/dL (ref 32.0–36.0)
MCV: 93.2 fL (ref 80.0–100.0)
MONO ABS: 385 {cells}/uL (ref 200–950)
MONOS PCT: 11 %
MPV: 9.3 fL (ref 7.5–12.5)
NEUTROS ABS: 1890 {cells}/uL (ref 1500–7800)
Neutrophils Relative %: 54 %
PLATELETS: 168 10*3/uL (ref 140–400)
RBC: 3.11 MIL/uL — ABNORMAL LOW (ref 4.20–5.80)
RDW: 13.4 % (ref 11.0–15.0)
WBC: 3.5 10*3/uL — ABNORMAL LOW (ref 3.8–10.8)

## 2016-04-20 LAB — TSH: TSH: 4.37 mIU/L (ref 0.40–4.50)

## 2016-04-20 LAB — T4, FREE: Free T4: 1.1 ng/dL (ref 0.8–1.8)

## 2016-04-20 MED ORDER — MIDODRINE HCL 10 MG PO TABS: 10.0000 mg | ORAL_TABLET | Freq: Three times a day (TID) | ORAL | 11 refills | Status: DC

## 2016-04-20 NOTE — Patient Instructions (Addendum)
Medication Instructions:  Your physician has recommended you make the following change in your medication:   START Midodrine 10 mg by mouth three times daily  Labwork: Your physician recommends that you have lab work today CBC, TSH, and T4   Testing/Procedures: Your physician has recommended that you wear an event monitor. Event monitors are medical devices that record the heart's electrical activity. Doctors most often [place] these monitors to diagnose arrhythmias. Arrhythmias are problems with the speed or rhythm of the heartbeat. The monitor is a small, portable device. You can wear one while you do your normal daily activities. This is usually used to diagnose what is causing palpitations/syncope (passing out).  Your physician has requested that you have an echocardiogram. Echocardiography is a painless test that uses sound waves to create images of your heart. It provides your doctor with information about the size and shape of your heart and how well your heart's chambers and valves are working. This procedure takes approximately one hour. There are no restrictions for this procedure.  Follow-Up: Your physician wants you to follow-up in: 6 months with Dr. Johnsie Cancel. You will receive a reminder letter in the mail two months in advance. If you don't receive a letter, please call our office to schedule the follow-up appointment.   If you need a refill on your cardiac medications before your next appointment, please call your pharmacy.

## 2016-04-22 ENCOUNTER — Telehealth: Payer: Self-pay | Admitting: Pulmonary Disease

## 2016-04-22 DIAGNOSIS — E538 Deficiency of other specified B group vitamins: Secondary | ICD-10-CM

## 2016-04-22 DIAGNOSIS — D638 Anemia in other chronic diseases classified elsewhere: Secondary | ICD-10-CM

## 2016-04-22 NOTE — Telephone Encounter (Signed)
I have forwarded this to Dr Lenna Gilford

## 2016-04-22 NOTE — Telephone Encounter (Signed)
Called and spoke with pt and he is aware of orders for labs and the stool cards that have been placed in the computer for the pt.  He will come tomorrow for these and is aware that we will call once resulted.

## 2016-04-22 NOTE — Telephone Encounter (Signed)
Spoke with Greg [name] and he states that he has increasing fatigue, dizziness and muscle weakness. Greg [name] states that Dr Johnsie Cancel advised him to contact PCP regarding recent labs showing anemia.  SN is out of office.   CY - Please advise. Thanks!  Allergies  Allergen Reactions  . Lisinopril     REACTION: INTOL to all ACE's  . Valsartan     REACTION: INTOL to all ARB's    Current Outpatient Prescriptions on File Prior to Visit  Medication Sig Dispense Refill  . aspirin 325 MG tablet Take 325 mg by mouth daily.     [name] labetalol (NORMODYNE) 200 MG tablet Take 2 tablets (400 mg total) by mouth 2 (two) times daily. 120 tablet 11  . levothyroxine (SYNTHROID, LEVOTHROID) 50 MCG tablet take 1 tablet by mouth once daily BEFORE BREAKFAST 30 tablet 6  . meclizine (ANTIVERT) 25 MG tablet Take 1 tablet (25 mg total) by mouth every 6 (six) hours as needed for dizziness. 50 tablet 1  . midodrine (PROAMATINE) 10 MG tablet Take 1 tablet (10 mg total) by mouth 3 (three) times daily. 30 tablet 11  . Multiple Vitamins-Minerals (CENTRUM PO) Take 1 tablet by mouth daily.    . Multiple Vitamins-Minerals (PROTEGRA PO) Take 1 tablet by mouth daily.    . simvastatin (ZOCOR) 40 MG tablet Take 1 tablet (40 mg total) by mouth at bedtime. 30 tablet 5  . Tamsulosin HCl (FLOMAX) 0.4 MG CAPS Take 0.4 mg by mouth daily.       No current facility-administered medications on file prior to visit.

## 2016-04-23 ENCOUNTER — Other Ambulatory Visit (INDEPENDENT_AMBULATORY_CARE_PROVIDER_SITE_OTHER): Payer: Medicare Other

## 2016-04-23 ENCOUNTER — Telehealth: Payer: Self-pay

## 2016-04-23 DIAGNOSIS — D638 Anemia in other chronic diseases classified elsewhere: Secondary | ICD-10-CM | POA: Diagnosis not present

## 2016-04-23 DIAGNOSIS — E538 Deficiency of other specified B group vitamins: Secondary | ICD-10-CM

## 2016-04-23 LAB — CBC WITH DIFFERENTIAL/PLATELET
Basophils Absolute: 0 K/uL (ref 0.0–0.1)
Basophils Relative: 0.8 % (ref 0.0–3.0)
Eosinophils Absolute: 0.1 K/uL (ref 0.0–0.7)
Eosinophils Relative: 3.1 % (ref 0.0–5.0)
HCT: 29.9 % — ABNORMAL LOW (ref 39.0–52.0)
Hemoglobin: 10.2 g/dL — ABNORMAL LOW (ref 13.0–17.0)
Lymphocytes Relative: 30.8 % (ref 12.0–46.0)
Lymphs Abs: 0.9 K/uL (ref 0.7–4.0)
MCHC: 34 g/dL (ref 30.0–36.0)
MCV: 96.2 fl (ref 78.0–100.0)
Monocytes Absolute: 0.4 K/uL (ref 0.1–1.0)
Monocytes Relative: 12.8 % — ABNORMAL HIGH (ref 3.0–12.0)
Neutro Abs: 1.6 K/uL (ref 1.4–7.7)
Neutrophils Relative %: 52.5 % (ref 43.0–77.0)
Platelets: 158 K/uL (ref 150.0–400.0)
RBC: 3.11 Mil/uL — ABNORMAL LOW (ref 4.22–5.81)
RDW: 14.1 % (ref 11.5–15.5)
WBC: 3 K/uL — ABNORMAL LOW (ref 4.0–10.5)

## 2016-04-23 LAB — COMPREHENSIVE METABOLIC PANEL
ALT: 20 U/L (ref 0–53)
AST: 22 U/L (ref 0–37)
Albumin: 4.1 g/dL (ref 3.5–5.2)
Alkaline Phosphatase: 42 U/L (ref 39–117)
BUN: 37 mg/dL — AB (ref 6–23)
CHLORIDE: 110 meq/L (ref 96–112)
CO2: 23 mEq/L (ref 19–32)
Calcium: 9.2 mg/dL (ref 8.4–10.5)
Creatinine, Ser: 2.25 mg/dL — ABNORMAL HIGH (ref 0.40–1.50)
GFR: 36.18 mL/min — ABNORMAL LOW (ref >60.00)
GLUCOSE: 113 mg/dL — AB (ref 70–99)
POTASSIUM: 5 meq/L (ref 3.5–5.1)
SODIUM: 142 meq/L (ref 135–145)
TOTAL PROTEIN: 6.9 g/dL (ref 6.0–8.3)
Total Bilirubin: 0.3 mg/dL (ref 0.2–1.2)

## 2016-04-23 LAB — IBC PANEL
Iron: 77 ug/dL (ref 42–165)
SATURATION RATIOS: 23.1 % (ref 20.0–50.0)
Transferrin: 238 mg/dL (ref 212.0–360.0)

## 2016-04-23 LAB — SEDIMENTATION RATE: SED RATE: 5 mm/h (ref 0–20)

## 2016-04-23 LAB — VITAMIN B12: Vitamin B-12: 734 pg/mL (ref 211–911)

## 2016-04-23 LAB — FOLATE: Folate: >23.7 ng/mL

## 2016-04-23 NOTE — Telephone Encounter (Signed)
Left message for patient to call back. Calling to make an appointment with Dr. Johnsie Cancel, next available.

## 2016-04-23 NOTE — Telephone Encounter (Signed)
F/U  Patient is returning your call

## 2016-04-23 NOTE — Telephone Encounter (Signed)
Made patient an appointment next available with Dr. Johnsie Cancel.

## 2016-04-26 ENCOUNTER — Other Ambulatory Visit: Payer: Medicare Other

## 2016-04-27 ENCOUNTER — Other Ambulatory Visit (INDEPENDENT_AMBULATORY_CARE_PROVIDER_SITE_OTHER): Payer: Medicare Other

## 2016-04-27 ENCOUNTER — Other Ambulatory Visit: Payer: Self-pay | Admitting: Pulmonary Disease

## 2016-04-27 DIAGNOSIS — R195 Other fecal abnormalities: Secondary | ICD-10-CM

## 2016-04-27 DIAGNOSIS — D649 Anemia, unspecified: Secondary | ICD-10-CM

## 2016-04-27 LAB — PROTEIN ELECTROPHORESIS, SERUM, WITH REFLEX
ABNORMAL PROTEIN BAND1: 0.2 g/dL
ABNORMAL PROTEIN BAND3: NOT DETECTED g/dL
Abnormal Protein Band2: 0.2 g/dL
Albumin ELP: 3.5 g/dL — ABNORMAL LOW (ref 3.8–4.8)
Alpha-1-Globulin: 0.3 g/dL (ref 0.2–0.3)
Alpha-2-Globulin: 0.6 g/dL (ref 0.5–0.9)
BETA GLOBULIN: 0.4 g/dL (ref 0.4–0.6)
Beta 2: 0.3 g/dL (ref 0.2–0.5)
Gamma Globulin: 0.7 g/dL — ABNORMAL LOW (ref 0.8–1.7)
Total Protein, Serum Electrophoresis: 5.7 g/dL — ABNORMAL LOW (ref 6.1–8.1)

## 2016-04-27 LAB — FECAL OCCULT BLOOD, IMMUNOCHEMICAL: Fecal Occult Bld: NEGATIVE

## 2016-04-27 LAB — IFE INTERPRETATION

## 2016-04-30 ENCOUNTER — Ambulatory Visit (HOSPITAL_COMMUNITY): Payer: Medicare Other | Attending: Internal Medicine

## 2016-04-30 ENCOUNTER — Other Ambulatory Visit: Payer: Self-pay

## 2016-04-30 ENCOUNTER — Ambulatory Visit (INDEPENDENT_AMBULATORY_CARE_PROVIDER_SITE_OTHER): Payer: Medicare Other

## 2016-04-30 DIAGNOSIS — R55 Syncope and collapse: Secondary | ICD-10-CM

## 2016-04-30 DIAGNOSIS — Z87891 Personal history of nicotine dependence: Secondary | ICD-10-CM | POA: Insufficient documentation

## 2016-04-30 DIAGNOSIS — I071 Rheumatic tricuspid insufficiency: Secondary | ICD-10-CM | POA: Diagnosis not present

## 2016-04-30 DIAGNOSIS — E119 Type 2 diabetes mellitus without complications: Secondary | ICD-10-CM | POA: Diagnosis not present

## 2016-04-30 DIAGNOSIS — R002 Palpitations: Secondary | ICD-10-CM

## 2016-04-30 DIAGNOSIS — I340 Nonrheumatic mitral (valve) insufficiency: Secondary | ICD-10-CM | POA: Diagnosis not present

## 2016-04-30 DIAGNOSIS — I119 Hypertensive heart disease without heart failure: Secondary | ICD-10-CM | POA: Insufficient documentation

## 2016-04-30 DIAGNOSIS — D649 Anemia, unspecified: Secondary | ICD-10-CM | POA: Insufficient documentation

## 2016-04-30 DIAGNOSIS — I872 Venous insufficiency (chronic) (peripheral): Secondary | ICD-10-CM | POA: Diagnosis not present

## 2016-04-30 DIAGNOSIS — E785 Hyperlipidemia, unspecified: Secondary | ICD-10-CM | POA: Insufficient documentation

## 2016-04-30 DIAGNOSIS — I358 Other nonrheumatic aortic valve disorders: Secondary | ICD-10-CM | POA: Insufficient documentation

## 2016-05-07 ENCOUNTER — Other Ambulatory Visit: Payer: Self-pay | Admitting: Pulmonary Disease

## 2016-05-07 DIAGNOSIS — D638 Anemia in other chronic diseases classified elsewhere: Secondary | ICD-10-CM

## 2016-05-10 ENCOUNTER — Telehealth: Payer: Self-pay | Admitting: Hematology

## 2016-05-10 ENCOUNTER — Encounter (INDEPENDENT_AMBULATORY_CARE_PROVIDER_SITE_OTHER): Payer: Medicare Other

## 2016-05-10 DIAGNOSIS — Z23 Encounter for immunization: Secondary | ICD-10-CM

## 2016-05-10 NOTE — Telephone Encounter (Signed)
Appointment scheduled with Dr. Irene Limbo on 9/25 at 2pm. Pt aware to arrive 30 minutes early. Demographics verified. Letter mailed to patient.

## 2016-05-24 ENCOUNTER — Other Ambulatory Visit (HOSPITAL_BASED_OUTPATIENT_CLINIC_OR_DEPARTMENT_OTHER): Payer: Medicare Other

## 2016-05-24 ENCOUNTER — Telehealth: Payer: Self-pay | Admitting: *Deleted

## 2016-05-24 ENCOUNTER — Ambulatory Visit (HOSPITAL_BASED_OUTPATIENT_CLINIC_OR_DEPARTMENT_OTHER): Payer: Medicare Other | Admitting: Hematology

## 2016-05-24 ENCOUNTER — Encounter: Payer: Self-pay | Admitting: Hematology

## 2016-05-24 ENCOUNTER — Telehealth: Payer: Self-pay | Admitting: Hematology

## 2016-05-24 VITALS — BP 166/91 | HR 63 | Temp 98.2°F | Resp 17 | Ht 69.5 in | Wt 188.2 lb

## 2016-05-24 DIAGNOSIS — N183 Chronic kidney disease, stage 3 (moderate): Secondary | ICD-10-CM

## 2016-05-24 DIAGNOSIS — D649 Anemia, unspecified: Secondary | ICD-10-CM

## 2016-05-24 DIAGNOSIS — D472 Monoclonal gammopathy: Secondary | ICD-10-CM

## 2016-05-24 DIAGNOSIS — I10 Essential (primary) hypertension: Secondary | ICD-10-CM

## 2016-05-24 LAB — CBC & DIFF AND RETIC
BASO%: 0.6 % (ref 0.0–2.0)
BASOS ABS: 0 10*3/uL (ref 0.0–0.1)
EOS%: 6 % (ref 0.0–7.0)
Eosinophils Absolute: 0.2 10*3/uL (ref 0.0–0.5)
HEMATOCRIT: 29.6 % — AB (ref 38.4–49.9)
HGB: 10 g/dL — ABNORMAL LOW (ref 13.0–17.1)
Immature Retic Fract: 12.3 % — ABNORMAL HIGH (ref 3.00–10.60)
LYMPH%: 36.4 % (ref 14.0–49.0)
MCH: 32.5 pg (ref 27.2–33.4)
MCHC: 33.8 g/dL (ref 32.0–36.0)
MCV: 96.1 fL (ref 79.3–98.0)
MONO#: 0.3 10*3/uL (ref 0.1–0.9)
MONO%: 9.9 % (ref 0.0–14.0)
NEUT#: 1.6 10*3/uL (ref 1.5–6.5)
NEUT%: 47.1 % (ref 39.0–75.0)
PLATELETS: 152 10*3/uL (ref 140–400)
RBC: 3.08 10*6/uL — ABNORMAL LOW (ref 4.20–5.82)
RDW: 13.1 % (ref 11.0–14.6)
RETIC %: 1.82 % — AB (ref 0.80–1.80)
Retic Ct Abs: 56.06 10*3/uL (ref 34.80–93.90)
WBC: 3.3 10*3/uL — ABNORMAL LOW (ref 4.0–10.3)
lymph#: 1.2 10*3/uL (ref 0.9–3.3)

## 2016-05-24 LAB — COMPREHENSIVE METABOLIC PANEL
ALK PHOS: 52 U/L (ref 40–150)
ALT: 23 U/L (ref 0–55)
AST: 24 U/L (ref 5–34)
Albumin: 3.9 g/dL (ref 3.5–5.0)
Anion Gap: 10 mEq/L (ref 3–11)
BUN: 31 mg/dL — AB (ref 7.0–26.0)
CALCIUM: 9.8 mg/dL (ref 8.4–10.4)
CO2: 22 mEq/L (ref 22–29)
CREATININE: 2.3 mg/dL — AB (ref 0.7–1.3)
Chloride: 110 mEq/L — ABNORMAL HIGH (ref 98–109)
EGFR: 30 mL/min/{1.73_m2} — ABNORMAL LOW (ref >90)
GLUCOSE: 101 mg/dL (ref 70–140)
POTASSIUM: 4.6 meq/L (ref 3.5–5.1)
Sodium: 142 mEq/L (ref 136–145)
Total Bilirubin: <0.3 mg/dL (ref 0.20–1.20)
Total Protein: 7.2 g/dL (ref 6.4–8.3)

## 2016-05-24 LAB — CHCC SMEAR

## 2016-05-24 NOTE — Telephone Encounter (Signed)
Patient sent back lab and given avs report and appointments for October. Central radiology will call re bone survey.

## 2016-05-24 NOTE — Telephone Encounter (Signed)
"  Dr. Lenna Gilford referred me here today and I need to know where you are located."  Address, directions, landmarks provided for today's appointment.
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
## 2016-05-25 LAB — SEDIMENTATION RATE: SED RATE: 3 mm/h (ref 0–30)

## 2016-05-25 LAB — KAPPA/LAMBDA LIGHT CHAINS
IG KAPPA FREE LIGHT CHAIN: 41.4 mg/L — AB (ref 3.3–19.4)
Ig Lambda Free Light Chain: 18.7 mg/L (ref 5.7–26.3)
KAPPA/LAMBDA FLC RATIO: 2.21 — AB (ref 0.26–1.65)

## 2016-05-25 LAB — IRON AND TIBC
%SAT: 23 % (ref 20–55)
IRON: 65 ug/dL (ref 42–163)
TIBC: 285 ug/dL (ref 202–409)
UIBC: 221 ug/dL (ref 117–376)

## 2016-05-25 LAB — ERYTHROPOIETIN: Erythropoietin: 23.7 m[IU]/mL — ABNORMAL HIGH (ref 2.6–18.5)

## 2016-05-25 LAB — FERRITIN: FERRITIN: 177 ng/mL (ref 22–316)

## 2016-05-26 LAB — MULTIPLE MYELOMA PANEL, SERUM
ALBUMIN SERPL ELPH-MCNC: 4.1 g/dL (ref 2.9–4.4)
ALBUMIN/GLOB SERPL: 1.6 (ref 0.7–1.7)
ALPHA 1: 0.2 g/dL (ref 0.0–0.4)
ALPHA2 GLOB SERPL ELPH-MCNC: 0.7 g/dL (ref 0.4–1.0)
B-Globulin SerPl Elph-Mcnc: 1 g/dL (ref 0.7–1.3)
Gamma Glob SerPl Elph-Mcnc: 0.8 g/dL (ref 0.4–1.8)
Globulin, Total: 2.7 g/dL (ref 2.2–3.9)
IGG (IMMUNOGLOBIN G), SERUM: 850 mg/dL (ref 700–1600)
IGM (IMMUNOGLOBIN M), SRM: 44 mg/dL (ref 15–143)
IgA, Qn, Serum: 175 mg/dL (ref 61–437)
M Protein SerPl Elph-Mcnc: 0.2 g/dL — ABNORMAL HIGH
TOTAL PROTEIN: 6.8 g/dL (ref 6.0–8.5)

## 2016-05-28 LAB — UPEP/TP, 24-HR URINE
ALBUMIN, U: 23.6 %
ALPHA-1-GLOBULIN, U: 6.3 %
Alpha-2-Globulin, U: 9.8 %
Beta Globulin, U: 23.8 %
Gamma Globulin, U: 36.4 %
PROTEIN 24H UR: 69 mg/(24.h) (ref 30–150)
PROTEIN UR: 5.2 mg/dL

## 2016-06-08 ENCOUNTER — Ambulatory Visit (HOSPITAL_COMMUNITY)
Admission: RE | Admit: 2016-06-08 | Discharge: 2016-06-08 | Disposition: A | Discharge status: Home / Self Care | Payer: Medicare Other | Source: Ambulatory Visit | Attending: Hematology | Admitting: Hematology

## 2016-06-08 ENCOUNTER — Ambulatory Visit (HOSPITAL_BASED_OUTPATIENT_CLINIC_OR_DEPARTMENT_OTHER): Payer: Medicare Other | Admitting: Hematology

## 2016-06-08 VITALS — BP 145/78 | HR 74 | Temp 98.1°F | Resp 18

## 2016-06-08 DIAGNOSIS — D649 Anemia, unspecified: Secondary | ICD-10-CM | POA: Diagnosis not present

## 2016-06-08 DIAGNOSIS — D472 Monoclonal gammopathy: Secondary | ICD-10-CM | POA: Diagnosis not present

## 2016-06-21 ENCOUNTER — Telehealth: Payer: Self-pay | Admitting: Cardiovascular Disease

## 2016-06-21 NOTE — Telephone Encounter (Signed)
He was put on it because he was hypotensive on dialysis  He can talk to his kidney doctors and if BP ok on dialysis  Can decrease/ or stop it as needed

## 2016-06-21 NOTE — Telephone Encounter (Signed)
Pt is simply calling to ask Dr Johnsie Cancel and RN if its necessary that he remain on midodrine, or can he come off of this medication, being he states "all my test have come back pretty good." Pt reports that he still takes this just as prescribed, and it absolutely causes him no issues at all.  Pt states the reason he wants to come off this med, is simply for the cost of it.  Pt reports it cost him $15 to refill this med, and he wants to avoid paying this, if its not pertinent that he be on this med. Advised the pt that if he asymptomatic and feels good, then I advised him to remain on his current regimen as is.  Informed the pt that I will route this message to Dr Johnsie Cancel and RN to review and advise on, and follow-up with the pt thereafter.  Pt verbalized understanding and agrees with this plan.

## 2016-06-21 NOTE — Telephone Encounter (Signed)
Had test done and was told it was normal, wants to know if he needs to stay on the Midodrine? pls call   [phone]

## 2016-06-22 MED ORDER — MIDODRINE HCL 10 MG PO TABS: 10.0000 mg | ORAL_TABLET | Freq: Two times a day (BID) | ORAL | Status: DC

## 2016-06-22 NOTE — Telephone Encounter (Signed)
Patient states he is not on dialysis. Patient is taking Midodrine for postural dizziness that he was started on his last office visit. Patient stated his vertigo has been a lot better since starting the midodrine. Informed patient that he has an up coming appointment in November, and we will see if his orthostatic BP's have improved at that time. Patient agreed to plan. Will forward to Dr. Johnsie Cancel for further advisement.

## 2016-06-22 NOTE — Telephone Encounter (Signed)
He can cut back to midodrine bid to save money if he wants

## 2016-06-22 NOTE — Telephone Encounter (Signed)
Left message for patient to call back  

## 2016-06-29 NOTE — Telephone Encounter (Signed)
Patient aware of Dr. Kyla Balzarine recommendations. Patient does not have any questions or concerns at this time. Encouraged patient to call our office with any other concerns. Patient verbalized understanding.

## 2016-06-30 ENCOUNTER — Telehealth: Payer: Self-pay | Admitting: General Practice

## 2016-06-30 NOTE — Telephone Encounter (Signed)
Left message regarding appointments for 08/2016 and 11/2016.
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
## 2016-07-29 ENCOUNTER — Encounter: Payer: Self-pay | Admitting: Cardiovascular Disease

## 2016-07-29 ENCOUNTER — Ambulatory Visit (INDEPENDENT_AMBULATORY_CARE_PROVIDER_SITE_OTHER): Payer: Medicare Other | Admitting: Cardiovascular Disease

## 2016-07-29 VITALS — BP 146/62 | HR 66 | Ht 69.0 in | Wt 188.2 lb

## 2016-07-29 DIAGNOSIS — Z09 Encounter for follow-up examination after completed treatment for conditions other than malignant neoplasm: Secondary | ICD-10-CM | POA: Diagnosis not present

## 2016-07-29 DIAGNOSIS — R002 Palpitations: Secondary | ICD-10-CM | POA: Diagnosis not present

## 2016-07-29 NOTE — Patient Instructions (Addendum)

## 2016-08-17 ENCOUNTER — Telehealth (INDEPENDENT_AMBULATORY_CARE_PROVIDER_SITE_OTHER): Payer: Self-pay | Admitting: Orthopaedic Surgery

## 2016-08-20 ENCOUNTER — Telehealth (INDEPENDENT_AMBULATORY_CARE_PROVIDER_SITE_OTHER): Payer: Self-pay | Admitting: Orthopaedic Surgery

## 2016-08-20 NOTE — Telephone Encounter (Signed)
Please advise 

## 2016-08-20 NOTE — Telephone Encounter (Signed)
Patient called asked if Dr Lorin Mercy can prescribe him something for pain in his back. The pain is going down both legs. The number  To contact him is [phone]

## 2016-08-20 NOTE — Telephone Encounter (Signed)
Call in ultram # 30    One po bid prn pain ucall thanks

## 2016-08-20 NOTE — Telephone Encounter (Signed)
Can you send in for me? Thanks

## 2016-08-20 NOTE — Telephone Encounter (Signed)
Patient aware Rx called in

## 2016-09-08 ENCOUNTER — Other Ambulatory Visit (HOSPITAL_BASED_OUTPATIENT_CLINIC_OR_DEPARTMENT_OTHER): Payer: Medicare Other

## 2016-09-08 DIAGNOSIS — N183 Chronic kidney disease, stage 3 (moderate): Secondary | ICD-10-CM

## 2016-09-08 DIAGNOSIS — D472 Monoclonal gammopathy: Secondary | ICD-10-CM | POA: Diagnosis not present

## 2016-09-08 DIAGNOSIS — D649 Anemia, unspecified: Secondary | ICD-10-CM | POA: Diagnosis not present

## 2016-09-08 LAB — COMPREHENSIVE METABOLIC PANEL
ALT: 22 U/L (ref 0–55)
ANION GAP: 9 meq/L (ref 3–11)
AST: 23 U/L (ref 5–34)
Albumin: 3.9 g/dL (ref 3.5–5.0)
Alkaline Phosphatase: 53 U/L (ref 40–150)
BILIRUBIN TOTAL: 0.33 mg/dL (ref 0.20–1.20)
BUN: 32.1 mg/dL — ABNORMAL HIGH (ref 7.0–26.0)
CO2: 23 meq/L (ref 22–29)
Calcium: 9.9 mg/dL (ref 8.4–10.4)
Chloride: 110 mEq/L — ABNORMAL HIGH (ref 98–109)
Creatinine: 2.3 mg/dL — ABNORMAL HIGH (ref 0.7–1.3)
EGFR: 30 mL/min/{1.73_m2} — AB (ref >90)
Glucose: 117 mg/dl (ref 70–140)
SODIUM: 142 meq/L (ref 136–145)
TOTAL PROTEIN: 7 g/dL (ref 6.4–8.3)

## 2016-09-08 LAB — FERRITIN: Ferritin: 133 ng/ml (ref 22–316)

## 2016-09-10 ENCOUNTER — Ambulatory Visit (INDEPENDENT_AMBULATORY_CARE_PROVIDER_SITE_OTHER): Payer: Medicare Other

## 2016-09-10 ENCOUNTER — Ambulatory Visit (INDEPENDENT_AMBULATORY_CARE_PROVIDER_SITE_OTHER): Payer: Medicare Other | Admitting: Orthopaedic Surgery

## 2016-09-10 ENCOUNTER — Encounter (INDEPENDENT_AMBULATORY_CARE_PROVIDER_SITE_OTHER): Payer: Self-pay | Admitting: Orthopaedic Surgery

## 2016-09-10 VITALS — BP 137/69 | HR 78 | Ht 68.0 in | Wt 187.0 lb

## 2016-09-10 DIAGNOSIS — M48061 Spinal stenosis, lumbar region without neurogenic claudication: Secondary | ICD-10-CM

## 2016-09-10 DIAGNOSIS — M5441 Lumbago with sciatica, right side: Secondary | ICD-10-CM | POA: Diagnosis not present

## 2016-09-10 DIAGNOSIS — M5442 Lumbago with sciatica, left side: Secondary | ICD-10-CM | POA: Diagnosis not present
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
## 2016-09-13 DIAGNOSIS — M48061 Spinal stenosis, lumbar region without neurogenic claudication: Secondary | ICD-10-CM | POA: Insufficient documentation

## 2016-09-20 ENCOUNTER — Ambulatory Visit: Payer: Medicare Other | Admitting: Pulmonary Disease

## 2016-09-21 ENCOUNTER — Ambulatory Visit (INDEPENDENT_AMBULATORY_CARE_PROVIDER_SITE_OTHER): Payer: Self-pay | Admitting: Orthopaedic Surgery

## 2016-09-22 ENCOUNTER — Ambulatory Visit
Admission: RE | Admit: 2016-09-22 | Discharge: 2016-09-22 | Disposition: A | Discharge status: Home / Self Care | Payer: Medicare Other | Source: Ambulatory Visit | Attending: Orthopaedic Surgery | Admitting: Orthopaedic Surgery

## 2016-09-22 DIAGNOSIS — M48061 Spinal stenosis, lumbar region without neurogenic claudication: Secondary | ICD-10-CM | POA: Diagnosis not present

## 2016-10-06 ENCOUNTER — Other Ambulatory Visit: Payer: Self-pay | Admitting: Cardiovascular Disease

## 2016-10-08 ENCOUNTER — Telehealth: Payer: Self-pay | Admitting: Cardiovascular Disease

## 2016-10-08 NOTE — Telephone Encounter (Signed)
Greg [name] is calling because that his prescription for [name] went from $15 to $47. He would like to know if there is an alternative that is less expensive. Thanks.

## 2016-10-10 NOTE — Telephone Encounter (Signed)
No substitute can cut back to once/day if doing ok

## 2016-10-14 ENCOUNTER — Other Ambulatory Visit: Payer: Self-pay | Admitting: Pulmonary Disease

## 2016-10-15 NOTE — Telephone Encounter (Signed)
Left message for patient to call back. Left detailed message about cutting back to once daily instead of twice daily.

## 2016-10-18 ENCOUNTER — Other Ambulatory Visit: Payer: Self-pay | Admitting: Pulmonary Disease

## 2016-10-20 ENCOUNTER — Encounter (INDEPENDENT_AMBULATORY_CARE_PROVIDER_SITE_OTHER): Payer: Self-pay | Admitting: Orthopaedic Surgery

## 2016-10-20 ENCOUNTER — Ambulatory Visit (INDEPENDENT_AMBULATORY_CARE_PROVIDER_SITE_OTHER): Payer: Medicare Other | Admitting: Orthopaedic Surgery

## 2016-10-20 VITALS — BP 166/78 | HR 66 | Ht 69.0 in | Wt 187.0 lb

## 2016-10-20 DIAGNOSIS — M48062 Spinal stenosis, lumbar region with neurogenic claudication: Secondary | ICD-10-CM | POA: Diagnosis not present

## 2016-10-20 NOTE — Addendum Note (Signed)
Addended by: Meyer Cory on: 10/20/2016 12:27 PM   Modules accepted: Orders
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
## 2016-10-21 ENCOUNTER — Telehealth (INDEPENDENT_AMBULATORY_CARE_PROVIDER_SITE_OTHER): Payer: Self-pay | Admitting: Orthopaedic Surgery

## 2016-10-21 NOTE — Telephone Encounter (Signed)
Greg [name] for Dx and injection code. Patient was not sure if injection will be covered by [employer], so he had them call us to see if prior authorization is needed.

## 2016-10-21 NOTE — Telephone Encounter (Signed)
This patient is being scheduled for an ESI. Can you please advise?

## 2016-11-04 ENCOUNTER — Encounter (INDEPENDENT_AMBULATORY_CARE_PROVIDER_SITE_OTHER): Payer: Self-pay | Admitting: Physical Medicine and Rehabilitation

## 2016-11-04 ENCOUNTER — Ambulatory Visit (INDEPENDENT_AMBULATORY_CARE_PROVIDER_SITE_OTHER): Payer: Medicare Other | Admitting: Physical Medicine and Rehabilitation

## 2016-11-04 ENCOUNTER — Ambulatory Visit (INDEPENDENT_AMBULATORY_CARE_PROVIDER_SITE_OTHER): Payer: Self-pay

## 2016-11-04 VITALS — BP 168/93 | HR 70 | Temp 97.9°F

## 2016-11-04 DIAGNOSIS — M5416 Radiculopathy, lumbar region: Secondary | ICD-10-CM | POA: Diagnosis not present

## 2016-11-04 MED ORDER — LIDOCAINE HCL (PF) 1 % IJ SOLN
0.3300 mL | Freq: Once | INTRAMUSCULAR | Status: AC
  Administered 2016-11-04: 0.3 mL

## 2016-11-04 MED ORDER — METHYLPREDNISOLONE ACETATE 80 MG/ML IJ SUSP
80.0000 mg | Freq: Once | INTRAMUSCULAR | Status: AC
  Administered 2016-11-04: 80 mg

## 2016-11-04 NOTE — Procedures (Signed)
Lumbar Epidural Steroid Injection - Interlaminar Approach with Fluoroscopic Guidance  [name]      Date of Birth: 08/14/1933 MRN: [id number] PCP: Noralee Space, MD      Visit Date: 11/04/2016   Universal Protocol:    Date/Time: 03/08/181:32 PM  Consent Given By: the patient  Position: PRONE  Additional Comments: Vital signs were monitored before and after the procedure. Patient was prepped and draped in the usual sterile fashion. The correct patient, procedure, and site was verified.   Injection Procedure Details:  Procedure Site One Meds Administered:  Meds ordered this encounter  Medications  . lidocaine (PF) (XYLOCAINE) 1 % injection 0.3 mL  . methylPREDNISolone acetate (DEPO-MEDROL) injection 80 mg     Laterality: Left  Location/Site:  L2-L3  Needle size: 20 G  Needle type: Tuohy  Needle Placement: Paramedian epidural  Findings:  -Contrast Used: 1 mL iohexol 180 mg iodine/mL   -Comments: Excellent flow of contrast into the epidural space.  Procedure Details: Using a paramedian approach from the side mentioned above, the region overlying the inferior lamina was localized under fluoroscopic visualization and the soft tissues overlying this structure were infiltrated with 4 ml. of 1% Lidocaine without Epinephrine. The Tuohy needle was inserted into the epidural space using a paramedian approach.   The epidural space was localized using loss of resistance along with lateral and bi-planar fluoroscopic views.  After negative aspirate for air, blood, and CSF, a 2 ml. volume of Isovue-250 was injected into the epidural space and the flow of contrast was observed. Radiographs were obtained for documentation purposes.    The injectate was administered into the level noted above.   Additional Comments:  The patient tolerated the procedure well Dressing: Band-Aid    Post-procedure details: Patient was observed during the procedure. Post-procedure  instructions were reviewed.  Patient left the clinic in stable condition.

## 2016-11-04 NOTE — Patient Instructions (Signed)
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
## 2016-11-11 ENCOUNTER — Ambulatory Visit (INDEPENDENT_AMBULATORY_CARE_PROVIDER_SITE_OTHER): Payer: Medicare Other | Admitting: Family

## 2016-11-11 ENCOUNTER — Other Ambulatory Visit (INDEPENDENT_AMBULATORY_CARE_PROVIDER_SITE_OTHER): Payer: Medicare Other

## 2016-11-11 ENCOUNTER — Encounter: Payer: Self-pay | Admitting: Family

## 2016-11-11 VITALS — BP 120/80 | HR 64 | Temp 97.6°F | Resp 16 | Ht 69.0 in | Wt 175.0 lb

## 2016-11-11 DIAGNOSIS — R7309 Other abnormal glucose: Secondary | ICD-10-CM | POA: Diagnosis not present

## 2016-11-11 DIAGNOSIS — Z23 Encounter for immunization: Secondary | ICD-10-CM

## 2016-11-11 DIAGNOSIS — E039 Hypothyroidism, unspecified: Secondary | ICD-10-CM

## 2016-11-11 DIAGNOSIS — N138 Other obstructive and reflux uropathy: Secondary | ICD-10-CM

## 2016-11-11 DIAGNOSIS — E7800 Pure hypercholesterolemia, unspecified: Secondary | ICD-10-CM | POA: Diagnosis not present

## 2016-11-11 DIAGNOSIS — Z0000 Encounter for general adult medical examination without abnormal findings: Secondary | ICD-10-CM | POA: Diagnosis not present

## 2016-11-11 DIAGNOSIS — N401 Enlarged prostate with lower urinary tract symptoms: Secondary | ICD-10-CM

## 2016-11-11 DIAGNOSIS — I10 Essential (primary) hypertension: Secondary | ICD-10-CM | POA: Diagnosis not present

## 2016-11-11 DIAGNOSIS — Z125 Encounter for screening for malignant neoplasm of prostate: Secondary | ICD-10-CM

## 2016-11-11 DIAGNOSIS — Z0001 Encounter for general adult medical examination with abnormal findings: Secondary | ICD-10-CM | POA: Insufficient documentation

## 2016-11-11 LAB — LIPID PANEL
CHOL/HDL RATIO: 2
Cholesterol: 161 mg/dL (ref 0–200)
HDL: 69.1 mg/dL (ref >39.00)
LDL Cholesterol: 80 mg/dL (ref 0–99)
NONHDL: 92.17
TRIGLYCERIDES: 63 mg/dL (ref 0.0–149.0)
VLDL: 12.6 mg/dL (ref 0.0–40.0)

## 2016-11-11 LAB — COMPREHENSIVE METABOLIC PANEL
ALK PHOS: 44 U/L (ref 39–117)
ALT: 25 U/L (ref 0–53)
AST: 25 U/L (ref 0–37)
Albumin: 4.3 g/dL (ref 3.5–5.2)
BILIRUBIN TOTAL: 0.4 mg/dL (ref 0.2–1.2)
BUN: 45 mg/dL — ABNORMAL HIGH (ref 6–23)
CALCIUM: 9.8 mg/dL (ref 8.4–10.5)
CO2: 23 mEq/L (ref 19–32)
CREATININE: 2.5 mg/dL — AB (ref 0.40–1.50)
Chloride: 108 mEq/L (ref 96–112)
GFR: 32 mL/min — AB (ref >60.00)
Glucose, Bld: 106 mg/dL — ABNORMAL HIGH (ref 70–99)
Potassium: 5.1 mEq/L (ref 3.5–5.1)
Sodium: 141 mEq/L (ref 135–145)
TOTAL PROTEIN: 6.8 g/dL (ref 6.0–8.3)

## 2016-11-11 LAB — CBC
HEMATOCRIT: 32 % — AB (ref 39.0–52.0)
HEMOGLOBIN: 10.9 g/dL — AB (ref 13.0–17.0)
MCHC: 34 g/dL (ref 30.0–36.0)
MCV: 96.1 fl (ref 78.0–100.0)
PLATELETS: 182 10*3/uL (ref 150.0–400.0)
RBC: 3.33 Mil/uL — ABNORMAL LOW (ref 4.22–5.81)
RDW: 14.6 % (ref 11.5–15.5)
WBC: 4.1 10*3/uL (ref 4.0–10.5)

## 2016-11-11 LAB — TSH: TSH: 3.51 u[IU]/mL (ref 0.35–4.50)

## 2016-11-11 LAB — PSA: PSA: 0.02 ng/mL — AB (ref 0.10–4.00)

## 2016-11-11 LAB — HEMOGLOBIN A1C: HEMOGLOBIN A1C: 6.5 % (ref 4.6–6.5)

## 2016-11-11 NOTE — Assessment & Plan Note (Signed)
Previously diagnosed with borderline type 2 diabetes. Obtain hemoglobin A1c. Continue with lifestyle management pending A1c results.

## 2016-11-11 NOTE — Patient Instructions (Signed)
Thank you for choosing [facility].  SUMMARY AND INSTRUCTIONS:  Medication:  Please continue to take your medication as prescribed   Labs:  Please stop by the lab on the lower level of the building for your blood work. Your results will be released to [place] (or called to you) after review, usually within 72 hours after test completion. If any changes need to be made, you will be notified at that same time.  1.) The lab is open from 7:30am to 5:30 pm Monday-Friday 2.) No appointment is necessary 3.) Fasting (if needed) is 6-8 hours after food and drink; black coffee and water are okay    Follow up:  If your symptoms worsen or fail to improve, please contact our office for further instruction, or in case of emergency go directly to the emergency room at the closest medical facility.   Health Maintenance  Topic Date Due  . PNA vac Low Risk Adult (2 of 2 - PCV13) 07/04/2010  . TETANUS/TDAP  01/17/2022  . INFLUENZA VACCINE  Completed    Health Maintenance, Male A healthy lifestyle and preventive care is important for your health and wellness. Ask your health care provider about what schedule of regular examinations is right for you. What should I know about weight and diet?  Eat a Healthy Diet  Eat plenty of vegetables, fruits, whole grains, low-fat dairy products, and lean protein.  Do not eat a lot of foods high in solid fats, added sugars, or salt. Maintain a Healthy Weight  Regular exercise can help you achieve or maintain a healthy weight. You should:  Do at least 150 minutes of exercise each week. The exercise should increase your heart rate and make you sweat (moderate-intensity exercise).  Do strength-training exercises at least twice a week. Watch Your Levels of Cholesterol and Blood Lipids  Have your blood tested for lipids and cholesterol every 5 years starting at 81 years of age. If you are at high risk for heart disease, you should start having your blood  tested when you are 81 years old. You may need to have your cholesterol levels checked more often if:  Your lipid or cholesterol levels are high.  You are older than 81 years of age.  You are at high risk for heart disease. What should I know about cancer screening? Many types of cancers can be detected early and may often be prevented. Lung Cancer  You should be screened every year for lung cancer if:  You are a current smoker who has smoked for at least 30 years.  You are a former smoker who has quit within the past 15 years.  Talk to your health care provider about your screening options, when you should start screening, and how often you should be screened. Colorectal Cancer  Routine colorectal cancer screening usually begins at 81 years of age and should be repeated every 5-10 years until you are 81 years old. You may need to be screened more often if early forms of precancerous polyps or small growths are found. Your health care provider may recommend screening at an earlier age if you have risk factors for colon cancer.  Your health care provider may recommend using home test kits to check for hidden blood in the stool.  A small camera at the end of a tube can be used to examine your colon (sigmoidoscopy or colonoscopy). This checks for the earliest forms of colorectal cancer. Prostate and Testicular Cancer  Depending on your age and  overall health, your health care provider may do certain tests to screen for prostate and testicular cancer.  Talk to your health care provider about any symptoms or concerns you have about testicular or prostate cancer. Skin Cancer  Check your skin from head to toe regularly.  Tell your health care provider about any new moles or changes in moles, especially if:  There is a change in a mole's size, shape, or color.  You have a mole that is larger than a pencil eraser.  Always use sunscreen. Apply sunscreen liberally and repeat throughout  the day.  Protect yourself by wearing long sleeves, pants, a wide-brimmed hat, and sunglasses when outside. What should I know about heart disease, diabetes, and high blood pressure?  If you are 58-73 years of age, have your blood pressure checked every 3-5 years. If you are 17 years of age or older, have your blood pressure checked every year. You should have your blood pressure measured twice-once when you are at a hospital or clinic, and once when you are not at a hospital or clinic. Record the average of the two measurements. To check your blood pressure when you are not at a hospital or clinic, you can use:  An automated blood pressure machine at a pharmacy.  A home blood pressure monitor.  Talk to your health care provider about your target blood pressure.  If you are between 76-91 years old, ask your health care provider if you should take aspirin to prevent heart disease.  Have regular diabetes screenings by checking your fasting blood sugar level.  If you are at a normal weight and have a low risk for diabetes, have this test once every three years after the age of 27.  If you are overweight and have a high risk for diabetes, consider being tested at a younger age or more often.  A one-time screening for abdominal aortic aneurysm (AAA) by ultrasound is recommended for men aged 45-75 years who are current or former smokers. What should I know about preventing infection? Hepatitis B  If you have a higher risk for hepatitis B, you should be screened for this virus. Talk with your health care provider to find out if you are at risk for hepatitis B infection. Hepatitis C  Blood testing is recommended for:  Everyone born from [birth year] through 1963.  Anyone with known risk factors for hepatitis C. Sexually Transmitted Diseases (STDs)  You should be screened each year for STDs including gonorrhea and chlamydia if:  You are sexually active and are younger than 81 years of age.  You  are older than 81 years of age and your health care provider tells you that you are at risk for this type of infection.  Your sexual activity has changed since you were last screened and you are at an increased risk for chlamydia or gonorrhea. Ask your health care provider if you are at risk.  Talk with your health care provider about whether you are at high risk of being infected with HIV. Your health care provider may recommend a prescription medicine to help prevent HIV infection. What else can I do?  Schedule regular health, dental, and eye exams.  Stay current with your vaccines (immunizations).  Do not use any tobacco products, such as cigarettes, chewing tobacco, and e-cigarettes. If you need help quitting, ask your health care provider.  Limit alcohol intake to no more than 2 drinks per day. One drink equals 12 ounces of beer, 5 ounces  of wine, or 1 ounces of hard liquor.  Do not use street drugs.  Do not share needles.  Ask your health care provider for help if you need support or information about quitting drugs.  Tell your health care provider if you often feel depressed.  Tell your health care provider if you have ever been abused or do not feel safe at home. This information is not intended to replace advice given to you by your health care provider. Make sure you discuss any questions you have with your health care provider. Document Released: 02/12/2008 Document Revised: 04/14/2016 Document Reviewed: 05/20/2015 Elsevier Interactive Patient Education  2017 [employer].

## 2016-11-11 NOTE — Assessment & Plan Note (Signed)
Reviewed and updated patient's medical, surgical, family and social history. Medications and allergies were also reviewed. Basic screenings for depression, activities of daily living, hearing, cognition and safety were performed. Provider list was updated and health plan was provided to the patient.  

## 2016-11-11 NOTE — Assessment & Plan Note (Signed)
1) Anticipatory Guidance: Discussed importance of wearing a seatbelt while driving and not texting while driving; changing batteries in smoke detector at least once annually; wearing suntan lotion when outside; eating a balanced and moderate diet; getting physical activity at least 30 minutes per day.  2) Immunizations / Screenings / Labs:  Prevnar updated today. All other immunizations are up-to-date per recommendations. Obtain PSA for prostate cancer screening. Obtain hemoglobin A1c given previous diagnosis of borderline diabetes. All other screenings are up-to-date per recommendations. Obtain CBC, CMET, TSH and lipid profile.    Overall well exam with risk factors for cardiovascular disease including hypertension, hyperlipidemia, and borderline diabetes. Chronic conditions appear chronically controlled through medication regimens with no adverse side effects. Encouraged to increase physical activity to 30 minutes of moderate level activity daily. Continue aspirin for anticoagulation. Continue other healthy lifestyle behaviors and choices. Follow-up prevention exam in 1 year. Follow up office visit for chronic conditions pending blood work.

## 2016-11-11 NOTE — Assessment & Plan Note (Signed)
Blood pressure well maintained with current medication regimen and no adverse side effects. Denies worst headache of life with no new symptoms of end organ damage noted on physical exam. Encouraged monitor blood pressure at home and follow low-sodium diet. Continue current dosage of labetalol. Continue to monitor.
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
## 2016-11-11 NOTE — Assessment & Plan Note (Signed)
Appears stable with current dosage of levothyroxine. Obtain TSH. Continue current dosage of levothyroxine pending TSH results. 

## 2016-11-11 NOTE — Assessment & Plan Note (Signed)
Appears stable with no adverse side effects maintained on Flomax. Continue current dosage of Flomax.

## 2016-11-11 NOTE — Assessment & Plan Note (Signed)
Appears stable with current dosage of simvastatin with no myalgias on physical exam. Obtain lipid profile. Continue current dosage of simvastatin pending lipid profile results.

## 2016-11-15 ENCOUNTER — Telehealth: Payer: Self-pay | Admitting: Family

## 2016-11-15 NOTE — Telephone Encounter (Signed)
Pt aware of results 

## 2016-11-15 NOTE — Telephone Encounter (Signed)
Patient called back returning your call about his lab results.

## 2016-11-22 ENCOUNTER — Other Ambulatory Visit: Payer: Self-pay | Admitting: Pulmonary Disease

## 2016-11-26 ENCOUNTER — Ambulatory Visit (INDEPENDENT_AMBULATORY_CARE_PROVIDER_SITE_OTHER): Payer: Medicare Other | Admitting: Ophthalmology

## 2016-11-29 ENCOUNTER — Telehealth: Payer: Self-pay | Admitting: Cardiovascular Disease

## 2016-11-29 NOTE — Telephone Encounter (Signed)
New message    Pt is concerned with having dizziness please call   Has appt on 12/16/16

## 2016-11-29 NOTE — Telephone Encounter (Signed)
New Message °

## 2016-11-29 NOTE — Telephone Encounter (Signed)
Patient stated he started back taking midodrine 3 times a day because of dizziness. Patient stated after taking midodrine TID he is still dizzy. Patient stated his dizziness is increasing in occurences. Patient stated that Dr. Johnsie Cancel thought if midodrine was not helping that he might need to change his other BP medication. Will forward to Dr. Johnsie Cancel for advisement.

## 2016-11-29 NOTE — Telephone Encounter (Signed)
Decrease normadyne to 1 tablet bid and f/u with his primary care doctor

## 2016-11-30 ENCOUNTER — Encounter: Payer: Self-pay | Admitting: Cardiovascular Disease

## 2016-12-01 MED ORDER — LABETALOL HCL 200 MG PO TABS: 200.0000 mg | ORAL_TABLET | Freq: Two times a day (BID) | ORAL | 3 refills | Status: DC

## 2016-12-01 NOTE — Telephone Encounter (Addendum)
Called patient to inform him of Dr. Kyla Balzarine recommendations. Patient will start taking Labetalol 200 (1 tablet)  BID and f/u with his PCP.

## 2016-12-03 ENCOUNTER — Encounter (INDEPENDENT_AMBULATORY_CARE_PROVIDER_SITE_OTHER): Payer: Self-pay | Admitting: Orthopaedic Surgery

## 2016-12-03 ENCOUNTER — Ambulatory Visit (INDEPENDENT_AMBULATORY_CARE_PROVIDER_SITE_OTHER): Payer: Medicare Other | Admitting: Orthopaedic Surgery

## 2016-12-03 VITALS — BP 139/66 | HR 70 | Ht 69.0 in | Wt 187.0 lb

## 2016-12-03 DIAGNOSIS — M48061 Spinal stenosis, lumbar region without neurogenic claudication: Secondary | ICD-10-CM
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
## 2016-12-06 ENCOUNTER — Telehealth: Payer: Self-pay | Admitting: Cardiovascular Disease

## 2016-12-06 NOTE — Telephone Encounter (Signed)
Patient wanted to clarify recommendation from last week. Patient stated he is doing fine. Informed patient to follow-up with his PCP and Dr. Johnsie Cancel will see him in November. Patient verbalized understanding.

## 2016-12-06 NOTE — Telephone Encounter (Signed)
Follow Up    Pt returning phone call from nurse that was made to him last week. Requesting call back

## 2016-12-07 ENCOUNTER — Other Ambulatory Visit (HOSPITAL_BASED_OUTPATIENT_CLINIC_OR_DEPARTMENT_OTHER): Payer: Medicare Other

## 2016-12-07 ENCOUNTER — Ambulatory Visit (HOSPITAL_BASED_OUTPATIENT_CLINIC_OR_DEPARTMENT_OTHER): Payer: Medicare Other | Admitting: Hematology

## 2016-12-07 ENCOUNTER — Telehealth: Payer: Self-pay | Admitting: Hematology

## 2016-12-07 ENCOUNTER — Ambulatory Visit (HOSPITAL_BASED_OUTPATIENT_CLINIC_OR_DEPARTMENT_OTHER): Payer: Medicare Other

## 2016-12-07 ENCOUNTER — Encounter: Payer: Self-pay | Admitting: Hematology

## 2016-12-07 VITALS — BP 156/67 | HR 71 | Temp 98.5°F | Resp 17 | Ht 68.0 in | Wt 177.6 lb

## 2016-12-07 DIAGNOSIS — D649 Anemia, unspecified: Secondary | ICD-10-CM | POA: Diagnosis not present

## 2016-12-07 DIAGNOSIS — N183 Chronic kidney disease, stage 3 (moderate): Secondary | ICD-10-CM

## 2016-12-07 DIAGNOSIS — D472 Monoclonal gammopathy: Secondary | ICD-10-CM

## 2016-12-07 DIAGNOSIS — I10 Essential (primary) hypertension: Secondary | ICD-10-CM | POA: Diagnosis not present

## 2016-12-07 LAB — CBC & DIFF AND RETIC
BASO%: 0.3 % (ref 0.0–2.0)
BASOS ABS: 0 10*3/uL (ref 0.0–0.1)
EOS ABS: 0.1 10*3/uL (ref 0.0–0.5)
EOS%: 2 % (ref 0.0–7.0)
HEMATOCRIT: 28.7 % — AB (ref 38.4–49.9)
HGB: 9.5 g/dL — ABNORMAL LOW (ref 13.0–17.1)
Immature Retic Fract: 6.7 % (ref 3.00–10.60)
LYMPH#: 1.1 10*3/uL (ref 0.9–3.3)
LYMPH%: 36.5 % (ref 14.0–49.0)
MCH: 32 pg (ref 27.2–33.4)
MCHC: 33.1 g/dL (ref 32.0–36.0)
MCV: 96.6 fL (ref 79.3–98.0)
MONO#: 0.3 10*3/uL (ref 0.1–0.9)
MONO%: 10.4 % (ref 0.0–14.0)
NEUT%: 50.8 % (ref 39.0–75.0)
NEUTROS ABS: 1.5 10*3/uL (ref 1.5–6.5)
Platelets: 148 10*3/uL (ref 140–400)
RBC: 2.97 10*6/uL — ABNORMAL LOW (ref 4.20–5.82)
RDW: 13.2 % (ref 11.0–14.6)
RETIC %: 1.07 % (ref 0.80–1.80)
RETIC CT ABS: 31.78 10*3/uL — AB (ref 34.80–93.90)
WBC: 3 10*3/uL — AB (ref 4.0–10.3)

## 2016-12-07 LAB — FERRITIN: Ferritin: 153 ng/ml (ref 22–316)

## 2016-12-07 LAB — COMPREHENSIVE METABOLIC PANEL
ALBUMIN: 3.9 g/dL (ref 3.5–5.0)
ALK PHOS: 52 U/L (ref 40–150)
ALT: 18 U/L (ref 0–55)
ANION GAP: 9 meq/L (ref 3–11)
AST: 21 U/L (ref 5–34)
BILIRUBIN TOTAL: 0.22 mg/dL (ref 0.20–1.20)
BUN: 31.7 mg/dL — ABNORMAL HIGH (ref 7.0–26.0)
CALCIUM: 9.6 mg/dL (ref 8.4–10.4)
CHLORIDE: 112 meq/L — AB (ref 98–109)
CO2: 21 mEq/L — ABNORMAL LOW (ref 22–29)
CREATININE: 2.3 mg/dL — AB (ref 0.7–1.3)
EGFR: 30 mL/min/{1.73_m2} — ABNORMAL LOW (ref >90)
Glucose: 107 mg/dl (ref 70–140)
Potassium: 5.1 mEq/L (ref 3.5–5.1)
Sodium: 142 mEq/L (ref 136–145)
TOTAL PROTEIN: 6.8 g/dL (ref 6.4–8.3)

## 2016-12-07 LAB — IRON AND TIBC
%SAT: 24 % (ref 20–55)
Iron: 67 ug/dL (ref 42–163)
TIBC: 281 ug/dL (ref 202–409)
UIBC: 214 ug/dL (ref 117–376)

## 2016-12-07 NOTE — Telephone Encounter (Signed)
Gave patient AVS and calender per 4/10 los.  

## 2016-12-08 LAB — KAPPA/LAMBDA LIGHT CHAINS
Ig Kappa Free Light Chain: 38.3 mg/L — ABNORMAL HIGH (ref 3.3–19.4)
Ig Lambda Free Light Chain: 16.5 mg/L (ref 5.7–26.3)
Kappa/Lambda FluidC Ratio: 2.32 — ABNORMAL HIGH (ref 0.26–1.65)

## 2016-12-09 LAB — MULTIPLE MYELOMA PANEL, SERUM
ALBUMIN SERPL ELPH-MCNC: 3.9 g/dL (ref 2.9–4.4)
ALPHA 1: 0.2 g/dL (ref 0.0–0.4)
Albumin/Glob SerPl: 1.6 (ref 0.7–1.7)
Alpha2 Glob SerPl Elph-Mcnc: 0.7 g/dL (ref 0.4–1.0)
B-GLOBULIN SERPL ELPH-MCNC: 0.9 g/dL (ref 0.7–1.3)
GLOBULIN, TOTAL: 2.5 g/dL (ref 2.2–3.9)
Gamma Glob SerPl Elph-Mcnc: 0.7 g/dL (ref 0.4–1.8)
IGA/IMMUNOGLOBULIN A, SERUM: 176 mg/dL (ref 61–437)
IgG, Qn, Serum: 771 mg/dL (ref 700–1600)
IgM, Qn, Serum: 36 mg/dL (ref 15–143)
M PROTEIN SERPL ELPH-MCNC: 0.2 g/dL — AB
Total Protein: 6.4 g/dL (ref 6.0–8.5)

## 2016-12-16 ENCOUNTER — Telehealth: Payer: Self-pay | Admitting: *Deleted

## 2016-12-16 ENCOUNTER — Ambulatory Visit: Payer: Medicare Other | Admitting: Cardiovascular Disease

## 2016-12-16 NOTE — Telephone Encounter (Signed)
-----   Message from Brunetta Genera, MD sent at 12/15/2016  2:15 AM EDT ----- Plz let Greg [name] know that his M protein levels have remained stable which is reassuring with no evidence of progression to myeloma at this time. thx GK

## 2016-12-16 NOTE — Telephone Encounter (Signed)
Informed patient of information below. Patient verbalized understanding.

## 2016-12-24 ENCOUNTER — Ambulatory Visit (INDEPENDENT_AMBULATORY_CARE_PROVIDER_SITE_OTHER): Payer: Medicare Other | Admitting: Ophthalmology

## 2016-12-24 DIAGNOSIS — H35033 Hypertensive retinopathy, bilateral: Secondary | ICD-10-CM

## 2016-12-24 DIAGNOSIS — H43812 Vitreous degeneration, left eye: Secondary | ICD-10-CM | POA: Diagnosis not present

## 2016-12-24 DIAGNOSIS — H353121 Nonexudative age-related macular degeneration, left eye, early dry stage: Secondary | ICD-10-CM

## 2016-12-24 DIAGNOSIS — I10 Essential (primary) hypertension: Secondary | ICD-10-CM

## 2016-12-24 DIAGNOSIS — D3132 Benign neoplasm of left choroid: Secondary | ICD-10-CM

## 2017-01-07 ENCOUNTER — Other Ambulatory Visit: Payer: Self-pay | Admitting: Pulmonary Disease
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
## 2017-02-21 ENCOUNTER — Ambulatory Visit (INDEPENDENT_AMBULATORY_CARE_PROVIDER_SITE_OTHER): Payer: Medicare Other | Admitting: Family

## 2017-02-21 ENCOUNTER — Encounter: Payer: Self-pay | Admitting: Family

## 2017-02-21 VITALS — BP 160/78 | HR 62 | Temp 98.3°F | Resp 16 | Ht 68.0 in | Wt 180.6 lb

## 2017-02-21 DIAGNOSIS — R109 Unspecified abdominal pain: Secondary | ICD-10-CM | POA: Diagnosis not present

## 2017-02-21 DIAGNOSIS — R42 Dizziness and giddiness: Secondary | ICD-10-CM

## 2017-02-21 NOTE — Assessment & Plan Note (Signed)
Continues to experience dizziness with no significant findings upon exam. Does not appear to be vertigo and possibly medication related. Decrease major known to 5 mg twice daily. Encouraged to continue to drink plenty of fluids. If symptoms worsen or do not improve consider referral for physical/occupational therapy and neuro rehabilitation.

## 2017-02-21 NOTE — Assessment & Plan Note (Signed)
New-onset abdominal discomfort with no significant findings on exam  and appears to have resolved. No further treatment is necessary at this time. Likely associated with dyspepsia. continue to monitor follow-up if symptoms worsen or do not improve.
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
## 2017-02-21 NOTE — Patient Instructions (Signed)
Thank you for choosing [facility].  SUMMARY AND INSTRUCTIONS:  Please start taking omeprazole or lansoprazole daily to see if that improves the phlegm.   Decrease the midodrine to 5 mg twice daily.   Continue to take the other medications as prescribed.  Follow up in 1 month or sooner if needed.  Medication:  Your prescription(s) have been submitted to your pharmacy or been printed and provided for you. Please take as directed and contact our office if you believe you are having problem(s) with the medication(s) or have any questions.  Follow up:  If your symptoms worsen or fail to improve, please contact our office for further instruction, or in case of emergency go directly to the emergency room at the closest medical facility.

## 2017-03-08 ENCOUNTER — Telehealth (INDEPENDENT_AMBULATORY_CARE_PROVIDER_SITE_OTHER): Payer: Self-pay
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
## 2017-03-08 NOTE — Telephone Encounter (Signed)
Patient would like to have another injection if possible.  Saw Dr. Ernestina Patches in March,2018.  CB# is [phone].  Please advise.  Thank You.

## 2017-03-08 NOTE — Telephone Encounter (Signed)
Yes if helped more than 50%

## 2017-03-09 NOTE — Telephone Encounter (Signed)
Left message for patient to call back to discuss/ schedule. 

## 2017-03-09 NOTE — Telephone Encounter (Signed)
Scheduled for 7/23 at 0930 with driver and no blood thinners.

## 2017-03-17 ENCOUNTER — Ambulatory Visit (INDEPENDENT_AMBULATORY_CARE_PROVIDER_SITE_OTHER): Payer: Medicare Other | Admitting: Family

## 2017-03-17 ENCOUNTER — Encounter: Payer: Self-pay | Admitting: Family

## 2017-03-17 VITALS — BP 130/82 | HR 60 | Temp 97.9°F | Resp 16 | Ht 68.0 in | Wt 180.1 lb

## 2017-03-17 DIAGNOSIS — K2100 Gastro-esophageal reflux disease with esophagitis, without bleeding: Secondary | ICD-10-CM

## 2017-03-17 DIAGNOSIS — R7309 Other abnormal glucose: Secondary | ICD-10-CM

## 2017-03-17 LAB — POCT GLYCOSYLATED HEMOGLOBIN (HGB A1C): Hemoglobin A1C: 5.6

## 2017-03-17 MED ORDER — RANITIDINE HCL 150 MG PO TABS: 150.0000 mg | ORAL_TABLET | Freq: Two times a day (BID) | ORAL | 2 refills | Status: DC

## 2017-03-17 NOTE — Patient Instructions (Signed)
Thank you for choosing [facility].  SUMMARY AND INSTRUCTIONS:  Please continue to take your medication as prescribed.  Start the Ranitidine for heart burn and fullness in your throat.  Continue to drink plenty of fluids.  Your A1c is GREAT!  Follow up in 4 months or sooner if needed.   Medication:  Your prescription(s) have been submitted to your pharmacy or been printed and provided for you. Please take as directed and contact our office if you believe you are having problem(s) with the medication(s) or have any questions.  Follow up:  If your symptoms worsen or fail to improve, please contact our office for further instruction, or in case of emergency go directly to the emergency room at the closest medical facility.

## 2017-03-17 NOTE — Assessment & Plan Note (Signed)
In office A1c of 5.6 indicating excellent control type 2 diabetes with lifestyle management and no medication. Diabetic foot exam completed today. All other diabetic screenings are up-to-date per recommendations. Continue lifestyle management and follow-up in 6 months or sooner if needed.
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
## 2017-03-17 NOTE — Assessment & Plan Note (Signed)
Throat symptoms consistent with gastroesophageal reflux with esophagitis. Start ranitidine secondary to previous kidney dysfunction and concern for advancement with proton pump inhibitor. Encourage GERD-related diet. Follow-up if symptoms worsen or do not improve.

## 2017-03-21 ENCOUNTER — Ambulatory Visit (INDEPENDENT_AMBULATORY_CARE_PROVIDER_SITE_OTHER): Payer: Medicare Other

## 2017-03-21 ENCOUNTER — Encounter (INDEPENDENT_AMBULATORY_CARE_PROVIDER_SITE_OTHER): Payer: Self-pay | Admitting: Physical Medicine and Rehabilitation

## 2017-03-21 ENCOUNTER — Ambulatory Visit (INDEPENDENT_AMBULATORY_CARE_PROVIDER_SITE_OTHER): Payer: Medicare Other | Admitting: Physical Medicine and Rehabilitation

## 2017-03-21 VITALS — BP 186/106 | HR 64 | Temp 98.2°F

## 2017-03-21 DIAGNOSIS — M5416 Radiculopathy, lumbar region: Secondary | ICD-10-CM

## 2017-03-21 DIAGNOSIS — M48062 Spinal stenosis, lumbar region with neurogenic claudication: Secondary | ICD-10-CM

## 2017-03-21 MED ORDER — METHYLPREDNISOLONE ACETATE 80 MG/ML IJ SUSP
80.0000 mg | Freq: Once | INTRAMUSCULAR | Status: AC
  Administered 2017-03-21: 80 mg

## 2017-03-21 MED ORDER — LIDOCAINE HCL (PF) 1 % IJ SOLN
2.0000 mL | Freq: Once | INTRAMUSCULAR | Status: AC
  Administered 2017-03-21: 2 mL

## 2017-03-21 NOTE — Progress Notes (Deleted)
Increased left side low back pain for 2 to 3 weeks. States he did well with last injection and was pretty much pain free up until then. Worse with walking and standing. Better with sitting. Radiates into hip and mid way down back of left leg with walking.

## 2017-03-21 NOTE — Procedures (Signed)
Greg [name] is an 83 year old gentleman that we completed an L2-3 intralaminar injection in the early part of March of this year. He states he had about 100% pain relief up until just a few weeks ago. He got almost 4 months of relief. MRI evidence shows mild to moderate multifactorial stenosis at L2-3 and less so at L3-4. 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 The MRI states that the last fully formed level is L5-S1 which I find difficult to interpret sometimes as others clearly a deep-seated L5 type vertebral body on the x-ray. If counting the hypoplastic ribs is T12 the last injection was more at L3-4. Going to try the injection at L2-3 based on that numbering scheme in to see if we could even longer relief. Either way the medicine should be able to get to the spot that is given a problem. He's had no new trauma. Had no new focal weakness. Exam shows no weakness. Lumbar Epidural Steroid Injection - Interlaminar Approach with Fluoroscopic Guidance  [name]      Date of Birth: 1932/10/02 MRN: [id number] PCP: Golden Circle, FNP      Visit Date: 03/21/2017   Universal Protocol:    Date/Time: 07/23/181:01 PM  Consent Given By: the patient  Position: PRONE  Additional Comments: Vital signs were monitored before and after the procedure. Patient was prepped and draped in the usual sterile fashion. The correct patient, procedure, and site was verified.   Injection Procedure Details:  Procedure Site One Meds Administered:  Meds ordered this encounter  Medications  . lidocaine (PF) (XYLOCAINE) 1 % injection 2 mL  . methylPREDNISolone acetate (DEPO-MEDROL) injection 80 mg     Laterality: Left  Location/Site:  L2-L3  Needle size: 20 G  Needle type: Tuohy  Needle Placement: Paramedian epidural  Findings:  -Contrast Used: 1 mL iohexol 180 mg iodine/mL   -Comments: Excellent flow of contrast into  the epidural space.  We completed an L2-3 level injection which technically was the level above the last injection. We did this on purpose with that numbering scheme starting from T12 which would be the hypoplastic ribs seen on x-ray. In any event the medicine should go to both places but I felt like it might be warranted to try this to see if he gets more relief.  Procedure Details: Using a paramedian approach from the side mentioned above, the region overlying the inferior lamina was localized under fluoroscopic visualization and the soft tissues overlying this structure were infiltrated with 4 ml. of 1% Lidocaine without Epinephrine. The Tuohy needle was inserted into the epidural space using a paramedian approach.   The epidural space was localized using loss of resistance along with lateral and bi-planar fluoroscopic views.  After negative aspirate for air, blood, and CSF, a 2 ml. volume of Isovue-250 was injected into the epidural space and the flow of contrast was observed. Radiographs were obtained for documentation purposes.    The injectate was administered into the level noted above.   Additional Comments:  The patient tolerated the procedure well No complications occurred Dressing: Band-Aid    Post-procedure details: Patient was observed during the procedure. Post-procedure instructions were reviewed.  Patient left the clinic in stable condition.

## 2017-03-21 NOTE — Patient Instructions (Signed)

## 2017-03-21 NOTE — Progress Notes (Unsigned)
Fluoro Time: 1 min 09 sec  MGy:  55.20

## 2017-04-13 ENCOUNTER — Other Ambulatory Visit: Payer: Self-pay | Admitting: Internal Medicine

## 2017-04-15 ENCOUNTER — Other Ambulatory Visit: Payer: Self-pay | Admitting: Internal Medicine

## 2017-04-18 ENCOUNTER — Telehealth: Payer: Self-pay | Admitting: Family

## 2017-04-18 MED ORDER — LEVOTHYROXINE SODIUM 50 MCG PO TABS: ORAL_TABLET | ORAL | 5 refills | Status: DC

## 2017-04-18 NOTE — Telephone Encounter (Signed)
Pt called stating that he needed a refill on his levothyroxine (SYNTHROID, LEVOTHROID) 50 MCG to [facility] on [address]. This refill was originally sent to Dr Melvyn Novas. He denied it and suggested that it be filled through his pcp.

## 2017-04-18 NOTE — Telephone Encounter (Signed)
Notified pt rx sent to [facility].Greg [name]

## 2017-04-27 ENCOUNTER — Telehealth: Payer: Self-pay | Admitting: Family

## 2017-04-27 ENCOUNTER — Other Ambulatory Visit (INDEPENDENT_AMBULATORY_CARE_PROVIDER_SITE_OTHER): Payer: Medicare Other

## 2017-04-27 ENCOUNTER — Encounter: Payer: Self-pay | Admitting: Family

## 2017-04-27 ENCOUNTER — Ambulatory Visit (INDEPENDENT_AMBULATORY_CARE_PROVIDER_SITE_OTHER): Payer: Medicare Other | Admitting: Family

## 2017-04-27 VITALS — BP 148/96 | HR 65 | Temp 97.8°F | Resp 16 | Ht 68.0 in | Wt 182.0 lb

## 2017-04-27 DIAGNOSIS — Z23 Encounter for immunization: Secondary | ICD-10-CM

## 2017-04-27 DIAGNOSIS — R61 Generalized hyperhidrosis: Secondary | ICD-10-CM | POA: Diagnosis not present

## 2017-04-27 DIAGNOSIS — G479 Sleep disorder, unspecified: Secondary | ICD-10-CM | POA: Diagnosis not present

## 2017-04-27 DIAGNOSIS — N138 Other obstructive and reflux uropathy: Secondary | ICD-10-CM

## 2017-04-27 DIAGNOSIS — N401 Enlarged prostate with lower urinary tract symptoms: Principal | ICD-10-CM

## 2017-04-27 LAB — CBC
HCT: 32.1 % — ABNORMAL LOW (ref 39.0–52.0)
HEMOGLOBIN: 10.9 g/dL — AB (ref 13.0–17.0)
MCHC: 33.8 g/dL (ref 30.0–36.0)
MCV: 97 fl (ref 78.0–100.0)
PLATELETS: 177 10*3/uL (ref 150.0–400.0)
RBC: 3.31 Mil/uL — AB (ref 4.22–5.81)
RDW: 13.9 % (ref 11.5–15.5)
WBC: 3.3 10*3/uL — AB (ref 4.0–10.5)

## 2017-04-27 LAB — COMPREHENSIVE METABOLIC PANEL
ALBUMIN: 4.1 g/dL (ref 3.5–5.2)
ALK PHOS: 40 U/L (ref 39–117)
ALT: 15 U/L (ref 0–53)
AST: 18 U/L (ref 0–37)
BUN: 27 mg/dL — AB (ref 6–23)
CALCIUM: 9.7 mg/dL (ref 8.4–10.5)
CHLORIDE: 110 meq/L (ref 96–112)
CO2: 26 mEq/L (ref 19–32)
Creatinine, Ser: 2.2 mg/dL — ABNORMAL HIGH (ref 0.40–1.50)
GFR: 37.04 mL/min — AB (ref >60.00)
Glucose, Bld: 120 mg/dL — ABNORMAL HIGH (ref 70–99)
POTASSIUM: 5 meq/L (ref 3.5–5.1)
SODIUM: 143 meq/L (ref 135–145)
Total Bilirubin: 0.3 mg/dL (ref 0.2–1.2)
Total Protein: 6.9 g/dL (ref 6.0–8.3)

## 2017-04-27 LAB — TSH: TSH: 2.93 u[IU]/mL (ref 0.35–4.50)

## 2017-04-27 MED ORDER — TRAZODONE HCL 50 MG PO TABS: 25.0000 mg | ORAL_TABLET | Freq: Every evening | ORAL | 3 refills | Status: DC | PRN

## 2017-04-27 NOTE — Assessment & Plan Note (Signed)
New-onset sleep disturbance with questionable origin and concern for possible BPH resulting in inability to return to sleep. Recommend follow-up with urology for further assessment. Encourage good sleep hygiene. Start low-dose trazodone. Follow-up if symptoms worsen or do not improve.

## 2017-04-27 NOTE — Telephone Encounter (Signed)
Referral has been sent.

## 2017-04-27 NOTE — Telephone Encounter (Signed)
Patient states [facility] needs a referral sent over before patients appointment on 05/19/17 @8am . Thank you.

## 2017-04-27 NOTE — Patient Instructions (Signed)
Thank you for choosing [facility].  SUMMARY AND INSTRUCTIONS:  Please continue to take your medications as prescribed.   Start trazodone as needed for sleep.   Please follow up with Dr. Risa Grill for the frequency of urination.  Medication:  Your prescription(s) have been submitted to your pharmacy or been printed and provided for you. Please take as directed and contact our office if you believe you are having problem(s) with the medication(s) or have any questions.  Labs:  Please stop by the lab on the lower level of the building for your blood work. Your results will be released to [place] (or called to you) after review, usually within 72 hours after test completion. If any changes need to be made, you will be notified at that same time.  1.) The lab is open from 7:30am to 5:30 pm Monday-Friday 2.) No appointment is necessary 3.) Fasting (if needed) is 6-8 hours after food and drink; black coffee and water are okay   Follow up:  If your symptoms worsen or fail to improve, please contact our office for further instruction, or in case of emergency go directly to the emergency room at the closest medical facility.   Recommendations for improving sleep:   Avoid having pets sleep in the bedroom  Avoid caffeine consumption after 4pm  Keep bedroom cool and conducive to sleep  Avoid nicotine use, especially in the evening  Avoid exercise within 2-3 hours before bedtime  Stimulus Control:   Go to bed only when sleepy  Use the bedroom for sleep and sex only  Go to another room if you are unable to fall asleep within 15 to 20 minutes  Read or engage in other quiet activities and return to bed only when sleepy.

## 2017-04-27 NOTE — Assessment & Plan Note (Signed)
Exam appears normal today. Obtain TSH, complete metabolic profile, and CBC. Unlikely medication related. Encouraged to continue to drink plenty of fluids. Follow-up if symptoms worsen or do not improve.
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
## 2017-05-13 ENCOUNTER — Telehealth: Payer: Self-pay | Admitting: Family

## 2017-05-13 NOTE — Telephone Encounter (Signed)
Patient called asking since he has 3 pills lefts of his thyroid medicine if Marya Amsler wanted to increase his dose now or wait til all 5 refills are complete. Refer to 8/29 blood work notes.  Please advise and call back

## 2017-05-13 NOTE — Telephone Encounter (Signed)
Thyroid function looks okay. Continue with refills and if symptoms worsen will increase dosage.

## 2017-05-13 NOTE — Telephone Encounter (Signed)
Pt aware.

## 2017-05-19 DIAGNOSIS — N281 Cyst of kidney, acquired: Secondary | ICD-10-CM | POA: Diagnosis not present

## 2017-05-19 DIAGNOSIS — N401 Enlarged prostate with lower urinary tract symptoms: Secondary | ICD-10-CM | POA: Diagnosis not present

## 2017-05-19 DIAGNOSIS — E291 Testicular hypofunction: Secondary | ICD-10-CM | POA: Diagnosis not present

## 2017-05-19 DIAGNOSIS — I129 Hypertensive chronic kidney disease with stage 1 through stage 4 chronic kidney disease, or unspecified chronic kidney disease: Secondary | ICD-10-CM | POA: Diagnosis not present

## 2017-06-07 ENCOUNTER — Telehealth (INDEPENDENT_AMBULATORY_CARE_PROVIDER_SITE_OTHER): Payer: Self-pay | Admitting: Physical Medicine and Rehabilitation

## 2017-06-08 ENCOUNTER — Encounter (INDEPENDENT_AMBULATORY_CARE_PROVIDER_SITE_OTHER): Payer: Self-pay | Admitting: Surgery

## 2017-06-08 ENCOUNTER — Ambulatory Visit (INDEPENDENT_AMBULATORY_CARE_PROVIDER_SITE_OTHER): Payer: Medicare Other | Admitting: Surgery

## 2017-06-08 ENCOUNTER — Telehealth (INDEPENDENT_AMBULATORY_CARE_PROVIDER_SITE_OTHER): Payer: Self-pay | Admitting: Orthopaedic Surgery

## 2017-06-08 ENCOUNTER — Ambulatory Visit (INDEPENDENT_AMBULATORY_CARE_PROVIDER_SITE_OTHER): Payer: Medicare Other

## 2017-06-08 DIAGNOSIS — M48061 Spinal stenosis, lumbar region without neurogenic claudication: Secondary | ICD-10-CM

## 2017-06-08 DIAGNOSIS — M5441 Lumbago with sciatica, right side: Secondary | ICD-10-CM

## 2017-06-08 DIAGNOSIS — G8929 Other chronic pain: Secondary | ICD-10-CM

## 2017-06-08 DIAGNOSIS — M533 Sacrococcygeal disorders, not elsewhere classified: Secondary | ICD-10-CM | POA: Diagnosis not present

## 2017-06-08 NOTE — Telephone Encounter (Signed)
Saw Jeneen Rinks today and looks like they might order SI JOINT injection, please check

## 2017-06-08 NOTE — Telephone Encounter (Signed)
Please advise 

## 2017-06-08 NOTE — Telephone Encounter (Signed)
Patient called stating that he needed something for his hip pain to last until he sees Dr. Ernestina Patches.  CB#[phone].  Thank you.
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
## 2017-06-09 NOTE — Telephone Encounter (Signed)
I put in new order. I do not have authorization to change a referral, so I could not cancel the SI joint injection.  Thanks.

## 2017-06-09 NOTE — Telephone Encounter (Signed)
I called discussed. Change injection to L2-3 midline.   Schedule ROV with me after. He wants ESI as soon as he can get in . Thanks   I discussed with him that pain meds will not help the spinal stenosis problem.

## 2017-06-09 NOTE — Telephone Encounter (Signed)
Dr. Lorin Mercy would like to change the order for this patient. You already have him scheduled. Please let me know if you would like for me to go in and change the actual order.  I did not want to cancel what he had in case that messed up your schedule. Thanks.

## 2017-06-09 NOTE — Telephone Encounter (Signed)
If you don't mind changing it so we can make sure we get authorization attached if we need it. Thank you!

## 2017-06-14 ENCOUNTER — Ambulatory Visit: Payer: Medicare Other | Admitting: Hematology

## 2017-06-14 ENCOUNTER — Other Ambulatory Visit: Payer: Medicare Other

## 2017-06-20 ENCOUNTER — Encounter (INDEPENDENT_AMBULATORY_CARE_PROVIDER_SITE_OTHER): Payer: Self-pay | Admitting: Physical Medicine and Rehabilitation

## 2017-06-20 ENCOUNTER — Ambulatory Visit (INDEPENDENT_AMBULATORY_CARE_PROVIDER_SITE_OTHER): Payer: Medicare Other

## 2017-06-20 ENCOUNTER — Ambulatory Visit (INDEPENDENT_AMBULATORY_CARE_PROVIDER_SITE_OTHER): Payer: Medicare Other | Admitting: Physical Medicine and Rehabilitation

## 2017-06-20 VITALS — BP 157/98 | HR 81

## 2017-06-20 DIAGNOSIS — M48062 Spinal stenosis, lumbar region with neurogenic claudication: Secondary | ICD-10-CM | POA: Diagnosis not present

## 2017-06-20 DIAGNOSIS — M5416 Radiculopathy, lumbar region: Secondary | ICD-10-CM | POA: Diagnosis not present

## 2017-06-20 MED ORDER — BETAMETHASONE SOD PHOS & ACET 6 (3-3) MG/ML IJ SUSP
12.0000 mg | Freq: Once | INTRAMUSCULAR | Status: AC
  Administered 2017-06-20: 12 mg

## 2017-06-20 MED ORDER — LIDOCAINE HCL (PF) 1 % IJ SOLN
2.0000 mL | Freq: Once | INTRAMUSCULAR | Status: AC
  Administered 2017-06-20: 2 mL

## 2017-06-20 NOTE — Progress Notes (Deleted)
PATIENT STATED HE IS HAVING PAIN ALL ACROSS LOWER BACK AREA. AND THAT EVERY NOW AGAIN HE MAKES A MOVE & HE HOLLERS.

## 2017-06-20 NOTE — Patient Instructions (Signed)

## 2017-06-21 NOTE — Procedures (Signed)
[name] is an 83 year old gentleman I have seen on 2 occasions and completed L2/3 interlaminar epidural steroid injection. He has moderate stenosis at this level. First injection in the early part of the year seem to give him several months of relief. Second injection only lasted for short-term. He is followed by Dr. Lorin Mercy as well as [name]. They've requested a third injection today. He is having pain across the lower back and he feels like every time he moves it'll actually make him Yellin pain.The injection  will be diagnostic and hopefully therapeutic. The patient has failed conservative care including time, medications and activity modification.  Lumbar Epidural Steroid Injection - Interlaminar Approach with Fluoroscopic Guidance  [name]      Date of Birth: 1933/01/28 MRN: [id number] PCP: Golden Circle, FNP      Visit Date: 06/20/2017   Universal Protocol:     Consent Given By: the patient  Position: PRONE  Additional Comments: Vital signs were monitored before and after the procedure. Patient was prepped and draped in the usual sterile fashion. The correct patient, procedure, and site was verified.   Injection Procedure Details:  Procedure Site One Meds Administered:  Meds ordered this encounter  Medications  . lidocaine (PF) (XYLOCAINE) 1 % injection 2 mL  . betamethasone acetate-betamethasone sodium phosphate (CELESTONE) injection 12 mg     Laterality: Left  Location/Site: Patient does have somewhat of a transitional anatomy. L2-L3  Needle size: 20 G  Needle type: Tuohy  Needle Placement: Paramedian epidural  Findings:  -Contrast Used: 0.5 mL iohexol 180 mg iodine/mL   -Comments: Excellent flow of contrast into the epidural space.  Procedure Details: Using a paramedian approach from the side mentioned above, the region overlying the inferior lamina was localized under fluoroscopic visualization and the soft tissues overlying this  structure were infiltrated with 4 ml. of 1% Lidocaine without Epinephrine. The Tuohy needle was inserted into the epidural space using a paramedian approach.   The epidural space was localized using loss of resistance along with lateral and bi-planar fluoroscopic views.  After negative aspirate for air, blood, and CSF, a 2 ml. volume of Isovue-250 was injected into the epidural space and the flow of contrast was observed. Radiographs were obtained for documentation purposes.    The injectate was administered into the level noted above.   Additional Comments:  The patient tolerated the procedure well Dressing: Band-Aid    Post-procedure details: Patient was observed during the procedure. Post-procedure instructions were reviewed.  Patient left the clinic in stable condition.

## 2017-06-22 ENCOUNTER — Ambulatory Visit (HOSPITAL_BASED_OUTPATIENT_CLINIC_OR_DEPARTMENT_OTHER): Payer: Medicare Other | Admitting: Hematology

## 2017-06-22 ENCOUNTER — Other Ambulatory Visit (HOSPITAL_BASED_OUTPATIENT_CLINIC_OR_DEPARTMENT_OTHER): Payer: Medicare Other

## 2017-06-22 ENCOUNTER — Encounter: Payer: Self-pay | Admitting: Hematology

## 2017-06-22 VITALS — BP 148/89 | HR 76 | Temp 98.1°F | Resp 17 | Ht 68.0 in | Wt 184.3 lb

## 2017-06-22 DIAGNOSIS — D472 Monoclonal gammopathy: Secondary | ICD-10-CM

## 2017-06-22 DIAGNOSIS — N183 Chronic kidney disease, stage 3 (moderate): Secondary | ICD-10-CM | POA: Diagnosis not present

## 2017-06-22 DIAGNOSIS — D649 Anemia, unspecified: Secondary | ICD-10-CM

## 2017-06-22 LAB — COMPREHENSIVE METABOLIC PANEL
ALT: 19 U/L (ref 0–55)
ANION GAP: 12 meq/L — AB (ref 3–11)
AST: 22 U/L (ref 5–34)
Albumin: 4.1 g/dL (ref 3.5–5.0)
Alkaline Phosphatase: 48 U/L (ref 40–150)
BILIRUBIN TOTAL: 0.25 mg/dL (ref 0.20–1.20)
BUN: 45.1 mg/dL — ABNORMAL HIGH (ref 7.0–26.0)
CHLORIDE: 110 meq/L — AB (ref 98–109)
CO2: 20 meq/L — AB (ref 22–29)
Calcium: 9.3 mg/dL (ref 8.4–10.4)
Creatinine: 2.4 mg/dL — ABNORMAL HIGH (ref 0.7–1.3)
EGFR: 28 mL/min/{1.73_m2} — AB (ref >60)
Glucose: 95 mg/dl (ref 70–140)
Potassium: 4 mEq/L (ref 3.5–5.1)
SODIUM: 142 meq/L (ref 136–145)
TOTAL PROTEIN: 7.3 g/dL (ref 6.4–8.3)

## 2017-06-22 LAB — CBC & DIFF AND RETIC
BASO%: 0.2 % (ref 0.0–2.0)
Basophils Absolute: 0 10*3/uL (ref 0.0–0.1)
EOS ABS: 0 10*3/uL (ref 0.0–0.5)
EOS%: 0.2 % (ref 0.0–7.0)
HCT: 30.4 % — ABNORMAL LOW (ref 38.4–49.9)
HGB: 9.9 g/dL — ABNORMAL LOW (ref 13.0–17.1)
IMMATURE RETIC FRACT: 8.5 % (ref 3.00–10.60)
LYMPH%: 26.5 % (ref 14.0–49.0)
MCH: 31.5 pg (ref 27.2–33.4)
MCHC: 32.6 g/dL (ref 32.0–36.0)
MCV: 96.8 fL (ref 79.3–98.0)
MONO#: 0.6 10*3/uL (ref 0.1–0.9)
MONO%: 11.7 % (ref 0.0–14.0)
NEUT%: 61.4 % (ref 39.0–75.0)
NEUTROS ABS: 3.3 10*3/uL (ref 1.5–6.5)
NRBC: 0 % (ref 0–0)
PLATELETS: 153 10*3/uL (ref 140–400)
RBC: 3.14 10*6/uL — AB (ref 4.20–5.82)
RDW: 13.7 % (ref 11.0–14.6)
RETIC CT ABS: 62.17 10*3/uL (ref 34.80–93.90)
Retic %: 1.98 % — ABNORMAL HIGH (ref 0.80–1.80)
WBC: 5.3 10*3/uL (ref 4.0–10.3)
lymph#: 1.4 10*3/uL (ref 0.9–3.3)

## 2017-06-22 LAB — FERRITIN: FERRITIN: 139 ng/mL (ref 22–316)
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
## 2017-06-23 LAB — KAPPA/LAMBDA LIGHT CHAINS
Ig Kappa Free Light Chain: 30.5 mg/L — ABNORMAL HIGH (ref 3.3–19.4)
Ig Lambda Free Light Chain: 16.9 mg/L (ref 5.7–26.3)
Kappa/Lambda FluidC Ratio: 1.8 — ABNORMAL HIGH (ref 0.26–1.65)

## 2017-06-24 LAB — MULTIPLE MYELOMA PANEL, SERUM
ALBUMIN SERPL ELPH-MCNC: 3.9 g/dL (ref 2.9–4.4)
Albumin/Glob SerPl: 1.3 (ref 0.7–1.7)
Alpha 1: 0.2 g/dL (ref 0.0–0.4)
Alpha2 Glob SerPl Elph-Mcnc: 0.9 g/dL (ref 0.4–1.0)
B-GLOBULIN SERPL ELPH-MCNC: 1 g/dL (ref 0.7–1.3)
Gamma Glob SerPl Elph-Mcnc: 1 g/dL (ref 0.4–1.8)
Globulin, Total: 3.1 g/dL (ref 2.2–3.9)
IGA/IMMUNOGLOBULIN A, SERUM: 190 mg/dL (ref 61–437)
IGM (IMMUNOGLOBIN M), SRM: 50 mg/dL (ref 15–143)
IgG, Qn, Serum: 895 mg/dL (ref 700–1600)
M PROTEIN SERPL ELPH-MCNC: 0.3 g/dL — AB
TOTAL PROTEIN: 7 g/dL (ref 6.0–8.5)

## 2017-06-27 ENCOUNTER — Telehealth: Payer: Self-pay | Admitting: Hematology

## 2017-06-27 NOTE — Telephone Encounter (Signed)
Scheduled appt per 10/24 los - reminder letter sent in the mail. - lab and f/u in 6 months.

## 2017-06-30 ENCOUNTER — Ambulatory Visit (INDEPENDENT_AMBULATORY_CARE_PROVIDER_SITE_OTHER): Payer: Medicare Other | Admitting: Surgery

## 2017-06-30 DIAGNOSIS — M48061 Spinal stenosis, lumbar region without neurogenic claudication: Secondary | ICD-10-CM | POA: Diagnosis not present

## 2017-06-30 DIAGNOSIS — M533 Sacrococcygeal disorders, not elsewhere classified: Secondary | ICD-10-CM | POA: Diagnosis not present

## 2017-06-30 DIAGNOSIS — G8929 Other chronic pain: Secondary | ICD-10-CM

## 2017-06-30 DIAGNOSIS — M48062 Spinal stenosis, lumbar region with neurogenic claudication: Secondary | ICD-10-CM | POA: Diagnosis not present
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
## 2017-07-04 ENCOUNTER — Other Ambulatory Visit: Payer: Self-pay | Admitting: Pulmonary Disease

## 2017-07-11 ENCOUNTER — Telehealth: Payer: Self-pay | Admitting: Family

## 2017-07-11 ENCOUNTER — Encounter (INDEPENDENT_AMBULATORY_CARE_PROVIDER_SITE_OTHER): Payer: Self-pay | Admitting: Physical Medicine and Rehabilitation

## 2017-07-11 ENCOUNTER — Ambulatory Visit (INDEPENDENT_AMBULATORY_CARE_PROVIDER_SITE_OTHER): Payer: Self-pay

## 2017-07-11 ENCOUNTER — Ambulatory Visit (INDEPENDENT_AMBULATORY_CARE_PROVIDER_SITE_OTHER): Payer: Medicare Other | Admitting: Physical Medicine and Rehabilitation

## 2017-07-11 DIAGNOSIS — M545 Low back pain, unspecified: Secondary | ICD-10-CM

## 2017-07-11 DIAGNOSIS — M48061 Spinal stenosis, lumbar region without neurogenic claudication: Secondary | ICD-10-CM

## 2017-07-11 DIAGNOSIS — G8929 Other chronic pain: Secondary | ICD-10-CM

## 2017-07-11 DIAGNOSIS — M461 Sacroiliitis, not elsewhere classified: Secondary | ICD-10-CM | POA: Diagnosis not present

## 2017-07-11 MED ORDER — RANITIDINE HCL 150 MG PO TABS: 150.0000 mg | ORAL_TABLET | Freq: Two times a day (BID) | ORAL | 1 refills | Status: DC

## 2017-07-11 NOTE — Telephone Encounter (Signed)
Rx sent 

## 2017-07-11 NOTE — Telephone Encounter (Signed)
Pt called for a refill of his  ranitidine (ZANTAC) 150 MG tablet Transfer appt set up with [facility] 1/14 Please advise  Pharmacy on File

## 2017-07-11 NOTE — Patient Instructions (Signed)
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
## 2017-07-13 ENCOUNTER — Encounter (INDEPENDENT_AMBULATORY_CARE_PROVIDER_SITE_OTHER): Payer: Self-pay | Admitting: Physical Medicine and Rehabilitation

## 2017-07-13 MED ORDER — BUPIVACAINE HCL 0.5 % IJ SOLN
2.0000 mL | INTRAMUSCULAR | Status: AC | PRN
  Administered 2017-07-11: 2 mL via INTRA_ARTICULAR

## 2017-07-13 MED ORDER — METHYLPREDNISOLONE ACETATE 80 MG/ML IJ SUSP
80.0000 mg | INTRAMUSCULAR | Status: AC | PRN
  Administered 2017-07-11: 80 mg via INTRA_ARTICULAR

## 2017-07-18 ENCOUNTER — Telehealth (INDEPENDENT_AMBULATORY_CARE_PROVIDER_SITE_OTHER): Payer: Self-pay | Admitting: Physical Medicine and Rehabilitation

## 2017-07-18 DIAGNOSIS — M48061 Spinal stenosis, lumbar region without neurogenic claudication: Secondary | ICD-10-CM

## 2017-07-18 NOTE — Telephone Encounter (Signed)
I sent message to [facility] about facet block, can you ask him or run by Lorin Mercy.

## 2017-07-18 NOTE — Telephone Encounter (Signed)
OK - thanks

## 2017-07-18 NOTE — Telephone Encounter (Signed)
Can you advise?  OK to schedule for facet injection?

## 2017-07-18 NOTE — Telephone Encounter (Signed)
Per Dr. Lorin Mercy, [place] to proceed with facet injection. I entered order.

## 2017-07-18 NOTE — Telephone Encounter (Signed)
See message below  Please advise

## 2017-08-03 ENCOUNTER — Encounter (INDEPENDENT_AMBULATORY_CARE_PROVIDER_SITE_OTHER): Payer: Self-pay | Admitting: Physical Medicine and Rehabilitation

## 2017-08-03 ENCOUNTER — Ambulatory Visit (INDEPENDENT_AMBULATORY_CARE_PROVIDER_SITE_OTHER): Payer: Medicare Other | Admitting: Physical Medicine and Rehabilitation

## 2017-08-03 ENCOUNTER — Ambulatory Visit (INDEPENDENT_AMBULATORY_CARE_PROVIDER_SITE_OTHER): Payer: Medicare Other

## 2017-08-03 VITALS — BP 158/93 | HR 67 | Temp 97.5°F | Ht 68.0 in | Wt 184.0 lb

## 2017-08-03 DIAGNOSIS — M47816 Spondylosis without myelopathy or radiculopathy, lumbar region: Secondary | ICD-10-CM

## 2017-08-03 MED ORDER — METHYLPREDNISOLONE ACETATE 80 MG/ML IJ SUSP
80.0000 mg | Freq: Once | INTRAMUSCULAR | Status: AC
  Administered 2017-08-03: 80 mg

## 2017-08-03 MED ORDER — LIDOCAINE HCL (PF) 1 % IJ SOLN
2.0000 mL | Freq: Once | INTRAMUSCULAR | Status: AC
  Administered 2017-08-03: 2 mL

## 2017-08-03 NOTE — Progress Notes (Deleted)
Patient is here today for bilateral L4-5 facet injections from Dr. Lorin Mercy. He is currently only taking 81mg  of aspirin daily. He denies any contrast allergy. He does have a driver today. He is currently hurting all across lower back, left side being worse than left. Left posterior pain with some radicular pain, legs giving way when walking.
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
## 2017-08-03 NOTE — Procedures (Signed)
Mr. Mutschler is a very pleasant 83 year old gentleman with chronic worsening severe midline axial low back pain just above the lumbosacral junction.  His case is interesting in that March of this year he completed an epidural injection at L2-3 for what was an area of mild stenosis and degenerative changes and he got almost 100% relief for quite a while.  Repeat injection did not give him much relief at all.  Sacroiliac joint injection was performed at the request of [name] working with Dr. Lorin Mercy and this did not seem to give him much relief.  MRI findings do show moderate facet arthropathy with small listhesis of L4 on L5.  He is having mainly axial low back pain and we are going to complete diagnostic and hopefully therapeutic bilateral intra-articular facet joint blocks.  The injection  will be diagnostic and hopefully therapeutic. The patient has failed conservative care including time, medications and activity modification.  Lumbar Facet Joint Intra-Articular Injection(s) with Fluoroscopic Guidance  [name]      Date of Birth: [birth year] MRN: [id number] PCP: Golden Circle, FNP      Visit Date: 08/03/2017   Universal Protocol:    Date/Time: 08/03/2017  Consent Given By: the patient  Position: PRONE   Additional Comments: Vital signs were monitored before and after the procedure. Patient was prepped and draped in the usual sterile fashion. The correct patient, procedure, and site was verified.   Injection Procedure Details:  Procedure Site One Meds Administered:  Meds ordered this encounter  Medications  . lidocaine (PF) (XYLOCAINE) 1 % injection 2 mL  . methylPREDNISolone acetate (DEPO-MEDROL) injection 80 mg     Laterality: Bilateral  Location/Site: A partially lumbarized S1 segment. L4-L5  Needle size: 22 guage  Needle type: Spinal  Needle Placement: Articular  Findings:  -Comments: Excellent flow of contrast producing a partial  arthrogram.  Procedure Details: The fluoroscope beam is vertically oriented in AP, and the inferior recess is visualized beneath the lower pole of the inferior apophyseal process, which represents the target point for needle insertion. When direct visualization is difficult the target point is located at the medial projection of the vertebral pedicle. The region overlying each aforementioned target is locally anesthetized with a 1 to 2 ml. volume of 1% Lidocaine without Epinephrine.   The spinal needle was inserted into each of the above mentioned facet joints using biplanar fluoroscopic guidance. A 0.25 to 0.5 ml. volume of Isovue-250 was injected and a partial facet joint arthrogram was obtained. A single spot film was obtained of the resulting arthrogram.    One to 1.25 ml of the steroid/anesthetic solution was then injected into each of the facet joints noted above.   Additional Comments:  The patient tolerated the procedure well No complications occurred Dressing: Band-Aid    Post-procedure details: Patient was observed during the procedure. Post-procedure instructions were reviewed.  Patient left the clinic in stable condition.

## 2017-08-03 NOTE — Patient Instructions (Signed)

## 2017-08-05 ENCOUNTER — Ambulatory Visit: Payer: Medicare Other | Admitting: Cardiovascular Disease

## 2017-08-05 ENCOUNTER — Encounter: Payer: Self-pay | Admitting: Cardiovascular Disease

## 2017-08-05 VITALS — BP 166/88 | HR 61 | Ht 68.0 in | Wt 187.0 lb

## 2017-08-05 DIAGNOSIS — I10 Essential (primary) hypertension: Secondary | ICD-10-CM | POA: Diagnosis not present

## 2017-08-05 NOTE — Patient Instructions (Addendum)

## 2017-09-12 ENCOUNTER — Other Ambulatory Visit (INDEPENDENT_AMBULATORY_CARE_PROVIDER_SITE_OTHER): Payer: Medicare Other

## 2017-09-12 ENCOUNTER — Encounter: Payer: Self-pay | Admitting: Nurse Practitioner

## 2017-09-12 ENCOUNTER — Ambulatory Visit (INDEPENDENT_AMBULATORY_CARE_PROVIDER_SITE_OTHER): Payer: Medicare Other | Admitting: Nurse Practitioner

## 2017-09-12 VITALS — BP 152/80 | HR 56 | Temp 97.7°F | Resp 16 | Ht 68.0 in | Wt 190.0 lb

## 2017-09-12 DIAGNOSIS — N289 Disorder of kidney and ureter, unspecified: Secondary | ICD-10-CM

## 2017-09-12 DIAGNOSIS — I10 Essential (primary) hypertension: Secondary | ICD-10-CM

## 2017-09-12 DIAGNOSIS — E039 Hypothyroidism, unspecified: Secondary | ICD-10-CM | POA: Diagnosis not present

## 2017-09-12 DIAGNOSIS — E7800 Pure hypercholesterolemia, unspecified: Secondary | ICD-10-CM | POA: Diagnosis not present

## 2017-09-12 DIAGNOSIS — K2100 Gastro-esophageal reflux disease with esophagitis, without bleeding: Secondary | ICD-10-CM

## 2017-09-12 LAB — COMPREHENSIVE METABOLIC PANEL
ALBUMIN: 4.3 g/dL (ref 3.5–5.2)
ALT: 18 U/L (ref 0–53)
AST: 22 U/L (ref 0–37)
Alkaline Phosphatase: 53 U/L (ref 39–117)
BUN: 27 mg/dL — ABNORMAL HIGH (ref 6–23)
CHLORIDE: 105 meq/L (ref 96–112)
CO2: 26 mEq/L (ref 19–32)
CREATININE: 2.16 mg/dL — AB (ref 0.40–1.50)
Calcium: 9.7 mg/dL (ref 8.4–10.5)
GFR: 37.8 mL/min — ABNORMAL LOW (ref >60.00)
Glucose, Bld: 117 mg/dL — ABNORMAL HIGH (ref 70–99)
POTASSIUM: 4.9 meq/L (ref 3.5–5.1)
Sodium: 140 mEq/L (ref 135–145)
Total Bilirubin: 0.4 mg/dL (ref 0.2–1.2)
Total Protein: 7.4 g/dL (ref 6.0–8.3)

## 2017-09-12 LAB — LIPID PANEL
CHOL/HDL RATIO: 2
CHOLESTEROL: 160 mg/dL (ref 0–200)
HDL: 65.9 mg/dL (ref >39.00)
LDL CALC: 78 mg/dL (ref 0–99)
NonHDL: 93.93
Triglycerides: 81 mg/dL (ref 0.0–149.0)
VLDL: 16.2 mg/dL (ref 0.0–40.0)

## 2017-09-12 MED ORDER — RANITIDINE HCL 150 MG PO TABS: 150.0000 mg | ORAL_TABLET | Freq: Two times a day (BID) | ORAL | 1 refills | Status: DC

## 2017-09-12 MED ORDER — LEVOTHYROXINE SODIUM 50 MCG PO TABS: ORAL_TABLET | ORAL | 5 refills | Status: DC
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
## 2017-09-12 NOTE — Patient Instructions (Addendum)
Please head downstairs for lab work.  Please remember healthy diet. Remember half of your plate should be veggies, one-fourth carbs, one-fourth meat, and don't eat meat at every meal. Also, remember to stay away from sugary drinks.  Id like to see you back in about 6 months, or sooner if you need me.  It was good to meet you. Thanks for letting me take care of you today :)

## 2017-09-17 ENCOUNTER — Encounter: Payer: Self-pay | Admitting: Nurse Practitioner

## 2017-09-17 NOTE — Assessment & Plan Note (Signed)
Stable on simvastatin, continue - Lipid panel; Future

## 2017-09-17 NOTE — Assessment & Plan Note (Signed)
Slightly Elevated BP in office today. Per last cardiology note on 08/05/17:  "allow some systolic HTN due to postural symptoms" He will continue to follow up with cardiology annually for hypertension.

## 2017-09-17 NOTE — Assessment & Plan Note (Signed)
Stable, continue zantac BID

## 2017-09-17 NOTE — Assessment & Plan Note (Signed)
Stable, continue synthroid

## 2017-09-30 ENCOUNTER — Encounter (INDEPENDENT_AMBULATORY_CARE_PROVIDER_SITE_OTHER): Payer: Self-pay | Admitting: Orthopaedic Surgery

## 2017-09-30 ENCOUNTER — Ambulatory Visit (INDEPENDENT_AMBULATORY_CARE_PROVIDER_SITE_OTHER): Payer: Medicare Other | Admitting: Orthopaedic Surgery

## 2017-09-30 VITALS — BP 178/90 | HR 76 | Ht 68.0 in | Wt 190.0 lb

## 2017-09-30 DIAGNOSIS — M47816 Spondylosis without myelopathy or radiculopathy, lumbar region: Secondary | ICD-10-CM | POA: Diagnosis not present

## 2017-10-06 ENCOUNTER — Telehealth (INDEPENDENT_AMBULATORY_CARE_PROVIDER_SITE_OTHER): Payer: Self-pay | Admitting: Physical Medicine and Rehabilitation

## 2017-10-06 NOTE — Telephone Encounter (Signed)
Patient called asking for an appointment with Dr. Ernestina Patches for a back injection if you could give him a call back to schedule. CB # [phone]

## 2017-10-13 ENCOUNTER — Ambulatory Visit (INDEPENDENT_AMBULATORY_CARE_PROVIDER_SITE_OTHER): Payer: Medicare Other

## 2017-10-13 ENCOUNTER — Ambulatory Visit (INDEPENDENT_AMBULATORY_CARE_PROVIDER_SITE_OTHER): Payer: Medicare Other | Admitting: Physical Medicine and Rehabilitation

## 2017-10-13 ENCOUNTER — Encounter (INDEPENDENT_AMBULATORY_CARE_PROVIDER_SITE_OTHER): Payer: Self-pay | Admitting: Physical Medicine and Rehabilitation

## 2017-10-13 VITALS — BP 158/89 | HR 69 | Temp 97.7°F

## 2017-10-13 DIAGNOSIS — M5416 Radiculopathy, lumbar region: Secondary | ICD-10-CM | POA: Diagnosis not present

## 2017-10-13 MED ORDER — BETAMETHASONE SOD PHOS & ACET 6 (3-3) MG/ML IJ SUSP
12.0000 mg | Freq: Once | INTRAMUSCULAR | Status: AC
  Administered 2017-10-13: 12 mg

## 2017-10-13 NOTE — Progress Notes (Deleted)
Pt states a dull aching pain in lower back that radiates down to both right and left leg. Pt states last injection 08/03/17 helped but not as much as the very injection. Pt states injections eases pain some. Pt states walking makes pain worse and legs become weak, heating pad eases pain some. +Driver, -BT, -Dye Allergies.

## 2017-10-13 NOTE — Patient Instructions (Signed)

## 2017-10-19 ENCOUNTER — Encounter (INDEPENDENT_AMBULATORY_CARE_PROVIDER_SITE_OTHER): Payer: Medicare Other | Admitting: Physical Medicine and Rehabilitation

## 2017-10-26 NOTE — Procedures (Signed)
Greg [name] is an 84 year old gentleman with some stenosis at L2-3 as well as facet arthropathy at L4-5.  Is been unclear where his pain generator is really coming from and we completed a few injections along with Dr. Narda Amber evaluation.  The injection that made the most difference was the very first epidural injection at L2-3.  This seemed to help quite a bit for several months and was really not been able to get back to that amount of relief.  We are going to repeat the epidural injection today. Lumbar Epidural Steroid Injection - Interlaminar Approach with Fluoroscopic Guidance  [name]      Date of Birth: 1932/11/02 MRN: [id number] PCP: Lance Sell, NP      Visit Date: 10/13/2017   Universal Protocol:     Consent Given By: the patient  Position: PRONE  Additional Comments: Vital signs were monitored before and after the procedure. Patient was prepped and draped in the usual sterile fashion. The correct patient, procedure, and site was verified.   Injection Procedure Details:  Procedure Site One Meds Administered:  Meds ordered this encounter  Medications  . betamethasone acetate-betamethasone sodium phosphate (CELESTONE) injection 12 mg     Laterality: Left  Location/Site:  L2-L3  Needle size: 20 G  Needle type: Tuohy  Needle Placement: Paramedian epidural  Findings:   -Comments: Excellent flow of contrast into the epidural space.  Procedure Details: Using a paramedian approach from the side mentioned above, the region overlying the inferior lamina was localized under fluoroscopic visualization and the soft tissues overlying this structure were infiltrated with 4 ml. of 1% Lidocaine without Epinephrine. The Tuohy needle was inserted into the epidural space using a paramedian approach.   The epidural space was localized using loss of resistance along with lateral and bi-planar fluoroscopic views.  After negative aspirate for air, blood, and CSF, a  2 ml. volume of Isovue-250 was injected into the epidural space and the flow of contrast was observed. Radiographs were obtained for documentation purposes.    The injectate was administered into the level noted above.   Additional Comments:  The patient tolerated the procedure well Dressing: Band-Aid    Post-procedure details: Patient was observed during the procedure. Post-procedure instructions were reviewed.  Patient left the clinic in stable condition.
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
## 2017-11-08 ENCOUNTER — Encounter (INDEPENDENT_AMBULATORY_CARE_PROVIDER_SITE_OTHER): Payer: Self-pay | Admitting: Orthopaedic Surgery

## 2017-11-15 ENCOUNTER — Other Ambulatory Visit: Payer: Self-pay

## 2017-11-15 MED ORDER — RANITIDINE HCL 150 MG PO TABS: 150.0000 mg | ORAL_TABLET | Freq: Two times a day (BID) | ORAL | 1 refills | Status: DC

## 2017-12-09 DIAGNOSIS — N401 Enlarged prostate with lower urinary tract symptoms: Secondary | ICD-10-CM | POA: Diagnosis not present
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
## 2017-12-26 ENCOUNTER — Inpatient Hospital Stay: Payer: Medicare Other

## 2017-12-26 ENCOUNTER — Telehealth: Payer: Self-pay

## 2017-12-26 ENCOUNTER — Encounter: Payer: Self-pay | Admitting: Hematology

## 2017-12-26 ENCOUNTER — Inpatient Hospital Stay: Payer: Medicare Other | Attending: Hematology | Admitting: Hematology

## 2017-12-26 VITALS — BP 176/71 | HR 66 | Temp 97.9°F | Resp 17 | Ht 68.0 in | Wt 185.4 lb

## 2017-12-26 DIAGNOSIS — D472 Monoclonal gammopathy: Secondary | ICD-10-CM

## 2017-12-26 DIAGNOSIS — D649 Anemia, unspecified: Secondary | ICD-10-CM

## 2017-12-26 DIAGNOSIS — D631 Anemia in chronic kidney disease: Secondary | ICD-10-CM | POA: Insufficient documentation

## 2017-12-26 DIAGNOSIS — N183 Chronic kidney disease, stage 3 (moderate): Secondary | ICD-10-CM | POA: Diagnosis not present

## 2017-12-26 LAB — COMPREHENSIVE METABOLIC PANEL
ALK PHOS: 53 U/L (ref 40–150)
ALT: 19 U/L (ref 0–55)
ANION GAP: 8 (ref 3–11)
AST: 25 U/L (ref 5–34)
Albumin: 3.8 g/dL (ref 3.5–5.0)
BUN: 22 mg/dL (ref 7–26)
CO2: 22 mmol/L (ref 22–29)
Calcium: 9.1 mg/dL (ref 8.4–10.4)
Chloride: 109 mmol/L (ref 98–109)
Creatinine, Ser: 2.15 mg/dL — ABNORMAL HIGH (ref 0.70–1.30)
GFR calc non Af Amer: 27 mL/min — ABNORMAL LOW (ref >60)
GFR, EST AFRICAN AMERICAN: 31 mL/min — AB (ref >60)
Glucose, Bld: 113 mg/dL (ref 70–140)
Potassium: 4.2 mmol/L (ref 3.5–5.1)
Sodium: 139 mmol/L (ref 136–145)
TOTAL PROTEIN: 6.6 g/dL (ref 6.4–8.3)

## 2017-12-26 LAB — CBC WITH DIFFERENTIAL/PLATELET
BASOS ABS: 0 10*3/uL (ref 0.0–0.1)
BASOS PCT: 0 %
Eosinophils Absolute: 0.1 10*3/uL (ref 0.0–0.5)
Eosinophils Relative: 3 %
HEMATOCRIT: 29.1 % — AB (ref 38.4–49.9)
HEMOGLOBIN: 9.5 g/dL — AB (ref 13.0–17.1)
Lymphocytes Relative: 42 %
Lymphs Abs: 1.1 10*3/uL (ref 0.9–3.3)
MCH: 31.6 pg (ref 27.2–33.4)
MCHC: 32.6 g/dL (ref 32.0–36.0)
MCV: 96.7 fL (ref 79.3–98.0)
Monocytes Absolute: 0.3 10*3/uL (ref 0.1–0.9)
Monocytes Relative: 11 %
NEUTROS PCT: 44 %
Neutro Abs: 1.1 10*3/uL — ABNORMAL LOW (ref 1.5–6.5)
Platelets: 142 10*3/uL (ref 140–400)
RBC: 3.01 MIL/uL — ABNORMAL LOW (ref 4.20–5.82)
RDW: 13.4 % (ref 11.0–14.6)
WBC: 2.6 10*3/uL — AB (ref 4.0–10.3)

## 2017-12-26 LAB — RETICULOCYTES
RBC.: 3.01 MIL/uL — AB (ref 4.20–5.82)
RETIC COUNT ABSOLUTE: 42.1 10*3/uL (ref 34.8–93.9)
Retic Ct Pct: 1.4 % (ref 0.8–1.8)

## 2017-12-26 LAB — FERRITIN: Ferritin: 98 ng/mL (ref 22–316)

## 2017-12-26 NOTE — Telephone Encounter (Signed)
Printed avs and calender of upcoming appointment. Per 4/29 los  

## 2017-12-28 LAB — MULTIPLE MYELOMA PANEL, SERUM
ALBUMIN/GLOB SERPL: 1.5 (ref 0.7–1.7)
ALPHA 1: 0.2 g/dL (ref 0.0–0.4)
Albumin SerPl Elph-Mcnc: 3.8 g/dL (ref 2.9–4.4)
Alpha2 Glob SerPl Elph-Mcnc: 0.7 g/dL (ref 0.4–1.0)
B-Globulin SerPl Elph-Mcnc: 1 g/dL (ref 0.7–1.3)
Gamma Glob SerPl Elph-Mcnc: 0.7 g/dL (ref 0.4–1.8)
Globulin, Total: 2.6 g/dL (ref 2.2–3.9)
IGA: 186 mg/dL (ref 61–437)
IgG (Immunoglobin G), Serum: 852 mg/dL (ref 700–1600)
IgM (Immunoglobulin M), Srm: 42 mg/dL (ref 15–143)
M Protein SerPl Elph-Mcnc: 0.2 g/dL — ABNORMAL HIGH
Total Protein ELP: 6.4 g/dL (ref 6.0–8.5)

## 2018-01-06 ENCOUNTER — Other Ambulatory Visit: Payer: Self-pay | Admitting: Pulmonary Disease

## 2018-01-12 ENCOUNTER — Other Ambulatory Visit: Payer: Self-pay

## 2018-01-12 MED ORDER — SIMVASTATIN 40 MG PO TABS: 40.0000 mg | ORAL_TABLET | Freq: Every day | ORAL | 2 refills | Status: DC

## 2018-01-16 ENCOUNTER — Other Ambulatory Visit: Payer: Self-pay

## 2018-01-16 MED ORDER — RANITIDINE HCL 150 MG PO TABS: 150.0000 mg | ORAL_TABLET | Freq: Two times a day (BID) | ORAL | 1 refills | Status: DC

## 2018-01-19 ENCOUNTER — Telehealth (INDEPENDENT_AMBULATORY_CARE_PROVIDER_SITE_OTHER): Payer: Self-pay | Admitting: Orthopaedic Surgery

## 2018-01-19 DIAGNOSIS — M47816 Spondylosis without myelopathy or radiculopathy, lumbar region: Secondary | ICD-10-CM

## 2018-01-19 NOTE — Telephone Encounter (Signed)
[facility] for [facility] thanks

## 2018-01-19 NOTE — Telephone Encounter (Signed)
Please advise. OK to enter order for injection or make ROV?

## 2018-01-19 NOTE — Telephone Encounter (Signed)
Patient called saying that his back gave out on him today and he was wanting another injection in his back, I told him I believe he would have to see Dr. Lorin Mercy before seeing Dr. Ernestina Patches again but when I told him Dr. Lorin Mercy next available appointment he said that was too far out and wanted me to send you a message. CB # [phone]

## 2018-01-20 NOTE — Telephone Encounter (Signed)
I called patient and advised that order would be put in for repeat injection.

## 2018-01-20 NOTE — Addendum Note (Signed)
Addended by: Meyer Cory on: 01/20/2018 11:28 AM   Modules accepted: Orders

## 2018-02-08 ENCOUNTER — Ambulatory Visit (INDEPENDENT_AMBULATORY_CARE_PROVIDER_SITE_OTHER): Payer: Medicare Other | Admitting: Physical Medicine and Rehabilitation

## 2018-02-08 ENCOUNTER — Ambulatory Visit (INDEPENDENT_AMBULATORY_CARE_PROVIDER_SITE_OTHER): Payer: Self-pay

## 2018-02-08 ENCOUNTER — Encounter (INDEPENDENT_AMBULATORY_CARE_PROVIDER_SITE_OTHER): Payer: Self-pay | Admitting: Physical Medicine and Rehabilitation

## 2018-02-08 ENCOUNTER — Encounter

## 2018-02-08 VITALS — BP 172/100 | HR 67 | Temp 98.1°F

## 2018-02-08 DIAGNOSIS — M5416 Radiculopathy, lumbar region: Secondary | ICD-10-CM

## 2018-02-08 MED ORDER — METHYLPREDNISOLONE ACETATE 80 MG/ML IJ SUSP
80.0000 mg | Freq: Once | INTRAMUSCULAR | Status: AC
  Administered 2018-02-08: 80 mg

## 2018-02-08 NOTE — Progress Notes (Signed)
s. Numeric Pain Rating Scale and Functional Assessment Average Pain 5   In the last MONTH (on 0-10 scale) has pain interfered with the following?  1. General activity like being  able to carry out your everyday physical activities such as walking, climbing stairs, carrying groceries, or moving a chair?  Rating(4)   +Driver, -BT, -Dye Allergies.

## 2018-02-08 NOTE — Patient Instructions (Signed)
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
## 2018-02-09 NOTE — Procedures (Signed)
Lumbar Epidural Steroid Injection - Interlaminar Approach with Fluoroscopic Guidance  [name]      Date of Birth: 06-23-1933 MRN: [id number] PCP: Lance Sell, NP      Visit Date: 02/08/2018   Universal Protocol:     Consent Given By: the patient  Position: PRONE  Additional Comments: Vital signs were monitored before and after the procedure. Patient was prepped and draped in the usual sterile fashion. The correct patient, procedure, and site was verified.   Injection Procedure Details:  Procedure Site One Meds Administered:  Meds ordered this encounter  Medications  . methylPREDNISolone acetate (DEPO-MEDROL) injection 80 mg     Laterality: Left  Location/Site:  L2-L3  Needle size: 20 G  Needle type: Tuohy  Needle Placement: Paramedian epidural  Findings:   -Comments: Excellent flow of contrast into the epidural space.  We used a numbering scan with the last lowest intervertebral body is L5.  Given that numbering scheme we did have some difficulty achieving a loss of resistance into the epidural space at L2-3.  Ultimately we got the patient more into forward flexion at the hips and we did remove the pillow from underneath his head and we did get easy loss of resistance at that point.  There may be some dynamic narrowing at this level.  Procedure Details: Using a paramedian approach from the side mentioned above, the region overlying the inferior lamina was localized under fluoroscopic visualization and the soft tissues overlying this structure were infiltrated with 4 ml. of 1% Lidocaine without Epinephrine. The Tuohy needle was inserted into the epidural space using a paramedian approach.   The epidural space was localized using loss of resistance along with lateral and bi-planar fluoroscopic views.  After negative aspirate for air, blood, and CSF, a 2 ml. volume of Isovue-250 was injected into the epidural space and the flow of contrast was  observed. Radiographs were obtained for documentation purposes.    The injectate was administered into the level noted above.   Additional Comments:  The patient tolerated the procedure well Dressing: Band-Aid    Post-procedure details: Patient was observed during the procedure. Post-procedure instructions were reviewed.  Patient left the clinic in stable condition.

## 2018-02-13 ENCOUNTER — Encounter (INDEPENDENT_AMBULATORY_CARE_PROVIDER_SITE_OTHER): Payer: Medicare Other | Admitting: Physical Medicine and Rehabilitation

## 2018-03-13 DIAGNOSIS — N281 Cyst of kidney, acquired: Secondary | ICD-10-CM | POA: Diagnosis not present

## 2018-03-13 DIAGNOSIS — N401 Enlarged prostate with lower urinary tract symptoms: Secondary | ICD-10-CM | POA: Diagnosis not present

## 2018-03-17 ENCOUNTER — Telehealth: Payer: Self-pay | Admitting: Nurse Practitioner

## 2018-03-17 ENCOUNTER — Other Ambulatory Visit: Payer: Self-pay | Admitting: Nurse Practitioner

## 2018-03-17 ENCOUNTER — Other Ambulatory Visit: Payer: Self-pay | Admitting: *Deleted

## 2018-03-17 MED ORDER — LEVOTHYROXINE SODIUM 50 MCG PO TABS: ORAL_TABLET | ORAL | 0 refills | Status: DC

## 2018-03-17 NOTE — Telephone Encounter (Signed)
Copied from [place] [id number]. Topic: General - Other >> Mar 17, 2018  8:32 AM Oneta Rack wrote:  Relation to pt: self Call back number: [phone] Pharmacy: [facility] [place], [place] [phone] (Phone) [phone] (Fax)  Reason for call:  Patient contacted pharmacy regarding levothyroxine (SYNTHROID, LEVOTHROID) 50 MCG tablet refill, patient states he has 1 pill left. Patient scheduled 6 month follow up for 04/12/18, please advise

## 2018-03-17 NOTE — Telephone Encounter (Signed)
Rx refilled per protocol. Lab 04/27/17- patient has appointment 04/12/18

## 2018-03-21 ENCOUNTER — Other Ambulatory Visit: Payer: Self-pay

## 2018-03-21 MED ORDER — RANITIDINE HCL 150 MG PO TABS: 150.0000 mg | ORAL_TABLET | Freq: Two times a day (BID) | ORAL | 1 refills | Status: DC
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
## 2018-04-12 ENCOUNTER — Encounter: Payer: Self-pay | Admitting: Nurse Practitioner

## 2018-04-12 ENCOUNTER — Ambulatory Visit (INDEPENDENT_AMBULATORY_CARE_PROVIDER_SITE_OTHER): Payer: Medicare Other | Admitting: Nurse Practitioner

## 2018-04-12 ENCOUNTER — Other Ambulatory Visit (INDEPENDENT_AMBULATORY_CARE_PROVIDER_SITE_OTHER): Payer: Medicare Other

## 2018-04-12 ENCOUNTER — Ambulatory Visit: Payer: Medicare Other

## 2018-04-12 ENCOUNTER — Ambulatory Visit (INDEPENDENT_AMBULATORY_CARE_PROVIDER_SITE_OTHER): Payer: Medicare Other | Admitting: *Deleted

## 2018-04-12 VITALS — BP 124/80 | HR 69 | Ht 68.0 in | Wt 184.0 lb

## 2018-04-12 VITALS — BP 124/80 | HR 69 | Temp 98.4°F | Resp 16 | Ht 68.0 in | Wt 184.1 lb

## 2018-04-12 DIAGNOSIS — E039 Hypothyroidism, unspecified: Secondary | ICD-10-CM

## 2018-04-12 DIAGNOSIS — N183 Chronic kidney disease, stage 3 unspecified: Secondary | ICD-10-CM | POA: Insufficient documentation

## 2018-04-12 DIAGNOSIS — R300 Dysuria: Secondary | ICD-10-CM | POA: Diagnosis not present

## 2018-04-12 DIAGNOSIS — Z0000 Encounter for general adult medical examination without abnormal findings: Secondary | ICD-10-CM | POA: Diagnosis not present

## 2018-04-12 DIAGNOSIS — R61 Generalized hyperhidrosis: Secondary | ICD-10-CM | POA: Diagnosis not present

## 2018-04-12 LAB — BASIC METABOLIC PANEL
BUN: 30 mg/dL — ABNORMAL HIGH (ref 6–23)
CALCIUM: 10 mg/dL (ref 8.4–10.5)
CO2: 25 meq/L (ref 19–32)
CREATININE: 2.27 mg/dL — AB (ref 0.40–1.50)
Chloride: 109 mEq/L (ref 96–112)
GFR: 35.64 mL/min — ABNORMAL LOW (ref >60.00)
GLUCOSE: 106 mg/dL — AB (ref 70–99)
Potassium: 4.7 mEq/L (ref 3.5–5.1)
SODIUM: 141 meq/L (ref 135–145)

## 2018-04-12 LAB — URINALYSIS, ROUTINE W REFLEX MICROSCOPIC
Bilirubin Urine: NEGATIVE
Hgb urine dipstick: NEGATIVE
KETONES UR: NEGATIVE
Leukocytes, UA: NEGATIVE
Nitrite: NEGATIVE
PH: 6 (ref 5.0–8.0)
RBC / HPF: NONE SEEN (ref 0–?)
SPECIFIC GRAVITY, URINE: 1.02 (ref 1.000–1.030)
Total Protein, Urine: NEGATIVE
Urine Glucose: NEGATIVE
Urobilinogen, UA: 0.2 (ref 0.0–1.0)

## 2018-04-12 LAB — TSH: TSH: 2.37 u[IU]/mL (ref 0.35–4.50)

## 2018-04-12 NOTE — Patient Instructions (Addendum)
Please head downstairs for lab work. If any of your test results are critically abnormal, you will be contacted right away. Otherwise, I will contact you within a week about your test results and follow up recommendations  I have placed a referral to neprhology. Our office will begin processing this referral. Please follow up if you have not heard anything about this referral within 10 days.  I will plan to see you back in about 6 months for routine follow up, or sooner if needed.

## 2018-04-12 NOTE — Patient Instructions (Addendum)
Continue doing brain stimulating activities (puzzles, reading, adult coloring books, staying active) to keep memory sharp.   Continue to eat heart healthy diet (full of fruits, vegetables, whole grains, lean protein, water--limit salt, fat, and sugar intake) and increase physical activity as tolerated.  Greg [name] , Thank you for taking time to come for your Medicare Wellness Visit. I appreciate your ongoing commitment to your health goals. Please review the following plan we discussed and let me know if I can assist you in the future.   These are the goals we discussed: Goals    . Patient Stated     Maintain my health and stay independent. Enjoy life, family, and worship God.        This is a list of the screening recommended for you and due dates:  Health Maintenance  Topic Date Due  . Flu Shot  03/30/2018  . Tetanus Vaccine  01/17/2022  . Pneumonia vaccines  Completed

## 2018-04-12 NOTE — Progress Notes (Signed)
Medical screening examination/treatment/procedure(s) were performed by the Wellness Coach, RN. As primary care provider I was immediately available for consulation/collaboration. I agree with above documentation. Tresha Muzio, NP  
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
## 2018-04-12 NOTE — Assessment & Plan Note (Signed)
Continue synthroid Update labs F/U with further recommendations pending lab results - TSH; Future 

## 2018-04-12 NOTE — Assessment & Plan Note (Signed)
Per chart review, nephrology referral was placed incorrectly in  january Will update labs and re-do neprhology referral today - Ambulatory referral to Nephrology - Basic metabolic panel; Future

## 2018-04-14 LAB — CULTURE, URINE COMPREHENSIVE
MICRO NUMBER: 90965616
RESULT: NO GROWTH
SPECIMEN QUALITY: ADEQUATE

## 2018-04-19 ENCOUNTER — Telehealth: Payer: Self-pay | Admitting: *Deleted

## 2018-04-19 ENCOUNTER — Encounter: Payer: Self-pay | Admitting: *Deleted

## 2018-04-19 ENCOUNTER — Telehealth: Payer: Self-pay | Admitting: Nurse Practitioner

## 2018-04-19 NOTE — Telephone Encounter (Signed)
Left mess for patient to call back. CRM created/labs routed to [facility].   Notes recorded by Lance Sell, NP on 04/18/2018 at 9:37 PM EDT Urine testing was normal. Thyroid level is normal, please continue your levothyroxine. Kidney function remains decreased, please keep follow up with nephrology as we discussed. None of your labs done last week give an explanation of why you would be experiencing increased sweating. I will plan to see you back in about 6 months for a routine follow up visit, however, if your excessive sweating persists, I'd like for you to return sooner so we can consider further testing

## 2018-04-19 NOTE — Telephone Encounter (Signed)
Copied from [place] [id number]. Topic: Quick Communication - Lab Results >> Apr 18, 2018  3:14 PM Lennox Solders wrote: Pt is calling and needs blood work results and urine test

## 2018-04-19 NOTE — Telephone Encounter (Signed)
Referral faxed to [facility] They will contact pt Pt is aware

## 2018-04-19 NOTE — Telephone Encounter (Signed)
Patient called [facility] asking about the status of his referral to nephrology. Informed [facility] that it is still being worked on and someone would be in touch to schedule as soon as it was ready.

## 2018-04-20 ENCOUNTER — Telehealth: Payer: Self-pay | Admitting: Nurse Practitioner

## 2018-04-20 NOTE — Telephone Encounter (Signed)
Called pt again- Left mess for patient to call back.

## 2018-04-20 NOTE — Telephone Encounter (Signed)
Copied from [place] [id number]. Topic: Quick Communication - Lab Results >> Apr 19, 2018 10:38 AM Cresenciano Lick, CMA wrote: Left message for pt to call back. [facility] may inform patient of results.

## 2018-04-20 NOTE — Telephone Encounter (Signed)
Charted in result notes. 

## 2018-05-03 NOTE — Telephone Encounter (Signed)
See 04/12/18 labs/result note. Closing phone note.

## 2018-05-04 ENCOUNTER — Telehealth: Payer: Self-pay | Admitting: Nurse Practitioner

## 2018-05-04 DIAGNOSIS — R413 Other amnesia: Secondary | ICD-10-CM

## 2018-05-04 NOTE — Telephone Encounter (Signed)
Copied from [place] [phone]. Topic: General - Other >> May 04, 2018  9:52 AM Cecelia Byars, NT wrote: Reason for CRM: Patient would like to know if he can take prevagen along with his medication , or memory he says at helps your brain to function better , please call him at [phone]

## 2018-05-08 ENCOUNTER — Other Ambulatory Visit: Payer: Self-pay | Admitting: Family

## 2018-05-09 NOTE — Telephone Encounter (Signed)
If he is worried about memory, I would recommend a neurology consult to determine the best treatment for him, this referral has been placed.

## 2018-05-09 NOTE — Telephone Encounter (Signed)
Pt informed of below.  

## 2018-05-12 ENCOUNTER — Other Ambulatory Visit: Payer: Self-pay | Admitting: Pulmonary Disease

## 2018-05-15 ENCOUNTER — Other Ambulatory Visit: Payer: Self-pay | Admitting: Nurse Practitioner

## 2018-05-15 ENCOUNTER — Telehealth: Payer: Self-pay | Admitting: Pulmonary Disease

## 2018-05-15 ENCOUNTER — Telehealth: Payer: Self-pay | Admitting: Nurse Practitioner

## 2018-05-15 MED ORDER — LABETALOL HCL 200 MG PO TABS: 200.0000 mg | ORAL_TABLET | Freq: Two times a day (BID) | ORAL | 1 refills | Status: DC

## 2018-05-15 NOTE — Telephone Encounter (Signed)
Called and spoke to pt.  Pt is requesting refill on Labetalol. I have recommended that pt contact PCP regarding refill.  Pt states he spoke with PCP this morning, and was advised to contact our office.  Pt requested that I contact PCP regarding this matter.  I have spoken to [place], who states she will send Rx request to [facility].  Pt has been made aware. Nothing further is needed.

## 2018-05-15 NOTE — Telephone Encounter (Signed)
Copied from [place] [id number]. Topic: Quick Communication - Rx Refill/Question >> May 15, 2018 11:49 AM [name] wrote: Medication: labetalol (NORMODYNE) 200 MG tablet  - pt requested refill and pharmacy sent request to Dr. Lenna Gilford - please advise Preferred Pharmacy (with phone number or street name): [facility] [place]

## 2018-05-15 NOTE — Telephone Encounter (Signed)
Refill sent. See meds. Left detailed mess informing pt.

## 2018-05-23 ENCOUNTER — Other Ambulatory Visit: Payer: Self-pay | Admitting: Nurse Practitioner

## 2018-05-24 ENCOUNTER — Ambulatory Visit (INDEPENDENT_AMBULATORY_CARE_PROVIDER_SITE_OTHER): Payer: Medicare Other

## 2018-05-24 DIAGNOSIS — Z23 Encounter for immunization: Secondary | ICD-10-CM | POA: Diagnosis not present

## 2018-06-13 ENCOUNTER — Telehealth (INDEPENDENT_AMBULATORY_CARE_PROVIDER_SITE_OTHER): Payer: Self-pay | Admitting: Orthopaedic Surgery

## 2018-06-13 DIAGNOSIS — M47816 Spondylosis without myelopathy or radiculopathy, lumbar region: Secondary | ICD-10-CM

## 2018-06-13 NOTE — Telephone Encounter (Signed)
Patient called asked if he can be referred back to Dr Ernestina Patches for an injection in his back. The number to contact patient is [phone]

## 2018-06-13 NOTE — Telephone Encounter (Signed)
Please advise. OK for referral? 

## 2018-06-14 NOTE — Telephone Encounter (Signed)
Referral entered  

## 2018-06-14 NOTE — Telephone Encounter (Signed)
OK - thanks

## 2018-06-23 ENCOUNTER — Other Ambulatory Visit: Payer: Self-pay | Admitting: Nurse Practitioner

## 2018-06-26 ENCOUNTER — Telehealth: Payer: Self-pay

## 2018-06-26 ENCOUNTER — Inpatient Hospital Stay: Payer: Medicare Other | Attending: Hematology | Admitting: Hematology

## 2018-06-26 ENCOUNTER — Encounter: Payer: Self-pay | Admitting: Hematology

## 2018-06-26 ENCOUNTER — Inpatient Hospital Stay: Payer: Medicare Other

## 2018-06-26 VITALS — BP 164/73 | HR 65 | Temp 98.0°F | Resp 16 | Ht 68.0 in | Wt 190.2 lb

## 2018-06-26 DIAGNOSIS — N183 Chronic kidney disease, stage 3 (moderate): Secondary | ICD-10-CM | POA: Diagnosis not present

## 2018-06-26 DIAGNOSIS — D649 Anemia, unspecified: Secondary | ICD-10-CM

## 2018-06-26 DIAGNOSIS — I129 Hypertensive chronic kidney disease with stage 1 through stage 4 chronic kidney disease, or unspecified chronic kidney disease: Secondary | ICD-10-CM | POA: Insufficient documentation

## 2018-06-26 DIAGNOSIS — D472 Monoclonal gammopathy: Secondary | ICD-10-CM | POA: Diagnosis not present

## 2018-06-26 DIAGNOSIS — D631 Anemia in chronic kidney disease: Secondary | ICD-10-CM | POA: Diagnosis not present

## 2018-06-26 LAB — IRON AND TIBC
IRON: 78 ug/dL (ref 42–163)
Saturation Ratios: 27 % — ABNORMAL LOW (ref 42–163)
TIBC: 293 ug/dL (ref 202–409)
UIBC: 214 ug/dL

## 2018-06-26 LAB — CBC WITH DIFFERENTIAL/PLATELET
ABS IMMATURE GRANULOCYTES: 0.01 10*3/uL (ref 0.00–0.07)
BASOS PCT: 0 %
Basophils Absolute: 0 10*3/uL (ref 0.0–0.1)
EOS ABS: 0.1 10*3/uL (ref 0.0–0.5)
EOS PCT: 3 %
HCT: 30 % — ABNORMAL LOW (ref 39.0–52.0)
Hemoglobin: 9.7 g/dL — ABNORMAL LOW (ref 13.0–17.0)
Immature Granulocytes: 0 %
Lymphocytes Relative: 41 %
Lymphs Abs: 1.5 10*3/uL (ref 0.7–4.0)
MCH: 31.9 pg (ref 26.0–34.0)
MCHC: 32.3 g/dL (ref 30.0–36.0)
MCV: 98.7 fL (ref 80.0–100.0)
MONOS PCT: 15 %
Monocytes Absolute: 0.5 10*3/uL (ref 0.1–1.0)
NEUTROS ABS: 1.5 10*3/uL — AB (ref 1.7–7.7)
Neutrophils Relative %: 41 %
PLATELETS: 144 10*3/uL — AB (ref 150–400)
RBC: 3.04 MIL/uL — AB (ref 4.22–5.81)
RDW: 12.9 % (ref 11.5–15.5)
WBC: 3.6 10*3/uL — AB (ref 4.0–10.5)
nRBC: 0 % (ref 0.0–0.2)

## 2018-06-26 LAB — CMP (CANCER CENTER ONLY)
ALBUMIN: 3.8 g/dL (ref 3.5–5.0)
ALK PHOS: 55 U/L (ref 38–126)
ALT: 20 U/L (ref 0–44)
AST: 25 U/L (ref 15–41)
Anion gap: 8 (ref 5–15)
BUN: 25 mg/dL — AB (ref 8–23)
CALCIUM: 9.5 mg/dL (ref 8.9–10.3)
CO2: 24 mmol/L (ref 22–32)
CREATININE: 2.32 mg/dL — AB (ref 0.61–1.24)
Chloride: 107 mmol/L (ref 98–111)
GFR, EST NON AFRICAN AMERICAN: 24 mL/min — AB (ref >60)
GFR, Est AFR Am: 28 mL/min — ABNORMAL LOW (ref >60)
GLUCOSE: 114 mg/dL — AB (ref 70–99)
POTASSIUM: 4.5 mmol/L (ref 3.5–5.1)
Sodium: 139 mmol/L (ref 135–145)
Total Bilirubin: 0.3 mg/dL (ref 0.3–1.2)
Total Protein: 6.9 g/dL (ref 6.5–8.1)

## 2018-06-26 LAB — RETICULOCYTES
IMMATURE RETIC FRACT: 16.8 % — AB (ref 2.3–15.9)
RBC.: 3.04 MIL/uL — ABNORMAL LOW (ref 4.22–5.81)
RETIC CT PCT: 2.1 % (ref 0.4–3.1)
Retic Count, Absolute: 62.6 10*3/uL (ref 19.0–186.0)

## 2018-06-26 LAB — FERRITIN: Ferritin: 92 ng/mL (ref 24–336)

## 2018-06-26 NOTE — Patient Instructions (Signed)
Thank you for choosing [facility] to provide your oncology and hematology care.  To afford each patient quality time with our providers, please arrive 30 minutes before your scheduled appointment time.  If you arrive late for your appointment, you may be asked to reschedule.  We strive to give you quality time with our providers, and arriving late affects you and other patients whose appointments are after yours.    If you are a no show for multiple scheduled visits, you may be dismissed from the clinic at the providers discretion.     Again, thank you for choosing [facility], our hope is that these requests will decrease the amount of time that you wait before being seen by our physicians.  ______________________________________________________________________   Should you have questions after your visit to the [facility], please contact our office at [phone] between the hours of 8:30 and 4:30 p.m.    Voicemails left after 4:30p.m will not be returned until the following business day.     For prescription refill requests, please have your pharmacy contact us directly.  Please also try to allow 48 hours for prescription requests.     Please contact the scheduling department for questions regarding scheduling.  For scheduling of procedures such as PET scans, CT scans, MRI, Ultrasound, etc please contact central scheduling at [phone].     Resources For Cancer Patients and Caregivers:    Oncolink.org:  A wonderful resource for patients and healthcare providers for information regarding your disease, ways to tract your treatment, what to expect, etc.      American Cancer Society:  800-227-2345  Can help patients locate various types of support and financial assistance   Cancer Care: 1-800-813-HOPE (4673) Provides financial assistance, online support groups, medication/co-pay assistance.     [place] DSS:  336-641-3447 Where to apply  for food stamps, Medicaid, and utility assistance   Medicare Rights Center: 800-333-4114 Helps people with Medicare understand their rights and benefits, navigate the Medicare system, and secure the quality healthcare they deserve   [employer]AT: 336-333-6589 [place] [employer]'s shared-ride transportation service for eligible riders who have a disability that prevents them from riding the fixed route bus.     For additional information on assistance programs please contact our social worker:   Abigail Elmore:  [phone]  

## 2018-06-26 NOTE — Telephone Encounter (Signed)
Printed avs and calender of upcoming appointment. Per 10/28 los 
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
## 2018-06-28 ENCOUNTER — Encounter (INDEPENDENT_AMBULATORY_CARE_PROVIDER_SITE_OTHER): Payer: Self-pay | Admitting: Physical Medicine and Rehabilitation

## 2018-06-28 ENCOUNTER — Ambulatory Visit (INDEPENDENT_AMBULATORY_CARE_PROVIDER_SITE_OTHER): Payer: Medicare Other | Admitting: Physical Medicine and Rehabilitation

## 2018-06-28 ENCOUNTER — Ambulatory Visit (INDEPENDENT_AMBULATORY_CARE_PROVIDER_SITE_OTHER): Payer: Self-pay

## 2018-06-28 VITALS — BP 185/102 | HR 52 | Temp 97.7°F

## 2018-06-28 DIAGNOSIS — M5416 Radiculopathy, lumbar region: Secondary | ICD-10-CM

## 2018-06-28 MED ORDER — METHYLPREDNISOLONE ACETATE 80 MG/ML IJ SUSP
80.0000 mg | Freq: Once | INTRAMUSCULAR | Status: AC
  Administered 2018-06-28: 80 mg

## 2018-06-28 NOTE — Procedures (Signed)
Lumbar Epidural Steroid Injection - Interlaminar Approach with Fluoroscopic Guidance  [name]      Date of Birth: [birth year] MRN: [id number] PCP: Lance Sell, NP      Visit Date: 06/28/2018   Universal Protocol:     Consent Given By: the patient  Position: PRONE  Additional Comments: Vital signs were monitored before and after the procedure. Patient was prepped and draped in the usual sterile fashion. The correct patient, procedure, and site was verified.   Injection Procedure Details:  Procedure Site One Meds Administered:  Meds ordered this encounter  Medications  . methylPREDNISolone acetate (DEPO-MEDROL) injection 80 mg     Laterality: Left  Location/Site: Patient requires pretty significant oblique to get to the interlaminar space here do to bony stenosis.  Numbering strength of the lumbar spine reveals a transitional segment which is a sacralized L5 and that would be counting small riblets at the T12 position.  L2-L3  Needle size: 20 G  Needle type: Tuohy  Needle Placement: Paramedian epidural  Findings:   -Comments: Excellent flow of contrast into the epidural space.  Procedure Details: Using a paramedian approach from the side mentioned above, the region overlying the inferior lamina was localized under fluoroscopic visualization and the soft tissues overlying this structure were infiltrated with 4 ml. of 1% Lidocaine without Epinephrine. The Tuohy needle was inserted into the epidural space using a paramedian approach.   The epidural space was localized using loss of resistance along with lateral and bi-planar fluoroscopic views.  After negative aspirate for air, blood, and CSF, a 2 ml. volume of Isovue-250 was injected into the epidural space and the flow of contrast was observed. Radiographs were obtained for documentation purposes.    The injectate was administered into the level noted above.   Additional Comments:  The patient  tolerated the procedure well Dressing: Band-Aid    Post-procedure details: Patient was observed during the procedure. Post-procedure instructions were reviewed.  Patient left the clinic in stable condition.

## 2018-06-28 NOTE — Patient Instructions (Signed)
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
## 2018-06-28 NOTE — Progress Notes (Signed)
 .  Numeric Pain Rating Scale and Functional Assessment Average Pain 7   In the last MONTH (on 0-10 scale) has pain interfered with the following?  1. General activity like being  able to carry out your everyday physical activities such as walking, climbing stairs, carrying groceries, or moving a chair?  Rating(5)   +Driver, -BT, -Dye Allergies.  

## 2018-07-11 ENCOUNTER — Ambulatory Visit: Payer: Medicare Other | Admitting: Neurology

## 2018-07-11 ENCOUNTER — Telehealth: Payer: Self-pay | Admitting: Neurology

## 2018-07-11 ENCOUNTER — Encounter: Payer: Self-pay | Admitting: Neurology

## 2018-07-11 VITALS — BP 174/80 | HR 67 | Ht 69.0 in | Wt 193.5 lb

## 2018-07-11 DIAGNOSIS — R413 Other amnesia: Secondary | ICD-10-CM | POA: Diagnosis not present

## 2018-07-11 NOTE — Telephone Encounter (Signed)
Patient is aware I gave him [facility] phone number of [phone] and to give them a call if he has not heard in the next 2-3 business days.
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
## 2018-07-11 NOTE — Telephone Encounter (Signed)
[employer] medicare order sent to [facility]. No auth they will reach out to the pt to schedule.  °

## 2018-07-12 ENCOUNTER — Telehealth: Payer: Self-pay | Admitting: *Deleted

## 2018-07-12 LAB — RPR: RPR Ser Ql: NONREACTIVE

## 2018-07-12 LAB — VITAMIN B12: VITAMIN B 12: 1295 pg/mL — AB (ref 232–1245)

## 2018-07-12 NOTE — Telephone Encounter (Signed)
Left message with normal results.  Provided our number to call with any questions.

## 2018-07-12 NOTE — Telephone Encounter (Signed)
-----   Message from Marcial Pacas, MD sent at 07/12/2018  9:28 AM EST ----- Please call patient for normal laboratory result

## 2018-07-13 DIAGNOSIS — D631 Anemia in chronic kidney disease: Secondary | ICD-10-CM | POA: Diagnosis not present

## 2018-07-13 DIAGNOSIS — I129 Hypertensive chronic kidney disease with stage 1 through stage 4 chronic kidney disease, or unspecified chronic kidney disease: Secondary | ICD-10-CM | POA: Diagnosis not present

## 2018-07-13 DIAGNOSIS — N183 Chronic kidney disease, stage 3 (moderate): Secondary | ICD-10-CM | POA: Diagnosis not present

## 2018-07-13 DIAGNOSIS — D472 Monoclonal gammopathy: Secondary | ICD-10-CM | POA: Diagnosis not present

## 2018-08-28 ENCOUNTER — Telehealth (INDEPENDENT_AMBULATORY_CARE_PROVIDER_SITE_OTHER): Payer: Self-pay

## 2018-08-28 DIAGNOSIS — M47816 Spondylosis without myelopathy or radiculopathy, lumbar region: Secondary | ICD-10-CM

## 2018-08-28 NOTE — Telephone Encounter (Signed)
Ok for repeat injection?

## 2018-08-28 NOTE — Telephone Encounter (Signed)
Ok thanks 

## 2018-08-28 NOTE — Telephone Encounter (Signed)
Patient called and would like to get another injection with Dr Ernestina Patches.   CB [phone]

## 2018-08-28 NOTE — Telephone Encounter (Signed)
Referral entered. I left voicemail for patient advising.

## 2018-08-28 NOTE — Addendum Note (Signed)
Addended by: Meyer Cory on: 08/28/2018 12:50 PM   Modules accepted: Orders

## 2018-09-05 ENCOUNTER — Ambulatory Visit (INDEPENDENT_AMBULATORY_CARE_PROVIDER_SITE_OTHER): Payer: Medicare Other | Admitting: Orthopaedic Surgery

## 2018-09-11 ENCOUNTER — Other Ambulatory Visit: Payer: Self-pay | Admitting: Nurse Practitioner

## 2018-09-12 ENCOUNTER — Ambulatory Visit (INDEPENDENT_AMBULATORY_CARE_PROVIDER_SITE_OTHER): Payer: Medicare Other | Admitting: Physical Medicine and Rehabilitation

## 2018-09-12 ENCOUNTER — Encounter (INDEPENDENT_AMBULATORY_CARE_PROVIDER_SITE_OTHER): Payer: Self-pay | Admitting: Physical Medicine and Rehabilitation

## 2018-09-12 ENCOUNTER — Ambulatory Visit (INDEPENDENT_AMBULATORY_CARE_PROVIDER_SITE_OTHER): Payer: Self-pay

## 2018-09-12 VITALS — BP 169/95 | HR 55 | Temp 97.6°F

## 2018-09-12 DIAGNOSIS — M5416 Radiculopathy, lumbar region: Secondary | ICD-10-CM

## 2018-09-12 DIAGNOSIS — M48061 Spinal stenosis, lumbar region without neurogenic claudication: Secondary | ICD-10-CM

## 2018-09-12 MED ORDER — METHYLPREDNISOLONE ACETATE 80 MG/ML IJ SUSP
80.0000 mg | Freq: Once | INTRAMUSCULAR | Status: AC
  Administered 2018-09-12: 80 mg

## 2018-09-12 NOTE — Patient Instructions (Signed)

## 2018-09-12 NOTE — Progress Notes (Signed)
 .  Numeric Pain Rating Scale and Functional Assessment Average Pain 8   In the last MONTH (on 0-10 scale) has pain interfered with the following?  1. General activity like being  able to carry out your everyday physical activities such as walking, climbing stairs, carrying groceries, or moving a chair?  Rating(5)   +Driver, -BT, -Dye Allergies.  

## 2018-09-13 ENCOUNTER — Encounter: Payer: Self-pay | Admitting: Neurology

## 2018-09-13 ENCOUNTER — Telehealth: Payer: Self-pay | Admitting: Neurology

## 2018-09-13 ENCOUNTER — Ambulatory Visit: Payer: Medicare Other | Admitting: Neurology

## 2018-09-13 VITALS — BP 155/82 | HR 93 | Ht 69.0 in | Wt 186.0 lb

## 2018-09-13 DIAGNOSIS — R413 Other amnesia: Secondary | ICD-10-CM

## 2018-09-13 MED ORDER — LORAZEPAM 0.5 MG PO TABS: 0.5000 mg | ORAL_TABLET | Freq: Three times a day (TID) | ORAL | 0 refills | Status: DC

## 2018-09-13 NOTE — Procedures (Signed)
Lumbar Epidural Steroid Injection - Interlaminar Approach with Fluoroscopic Guidance  [name]      Date of Birth: 11-30-1932 MRN: [id number] PCP: Lance Sell, NP      Visit Date: 09/12/2018   Universal Protocol:     Consent Given By: the patient  Position: PRONE  Additional Comments: Vital signs were monitored before and after the procedure. Patient was prepped and draped in the usual sterile fashion. The correct patient, procedure, and site was verified.   Injection Procedure Details:  Procedure Site One Meds Administered:  Meds ordered this encounter  Medications  . methylPREDNISolone acetate (DEPO-MEDROL) injection 80 mg     Laterality: Left  Location/Site: Lumbarized S1 segment. L2-L3  Needle size: 20 G  Needle type: Tuohy  Needle Placement: Paramedian epidural  Findings:   -Comments: Excellent flow of contrast into the epidural space.  Procedure Details: Using a paramedian approach from the side mentioned above, the region overlying the inferior lamina was localized under fluoroscopic visualization and the soft tissues overlying this structure were infiltrated with 4 ml. of 1% Lidocaine without Epinephrine. The Tuohy needle was inserted into the epidural space using a paramedian approach.   The epidural space was localized using loss of resistance along with lateral and bi-planar fluoroscopic views.  After negative aspirate for air, blood, and CSF, a 2 ml. volume of Isovue-250 was injected into the epidural space and the flow of contrast was observed. Radiographs were obtained for documentation purposes.    The injectate was administered into the level noted above.   Additional Comments:  The patient tolerated the procedure well Dressing: Band-Aid    Post-procedure details: Patient was observed during the procedure. Post-procedure instructions were reviewed.  Patient left the clinic in stable condition.

## 2018-09-13 NOTE — Telephone Encounter (Signed)
Please schedule MRI brain

## 2018-09-13 NOTE — Telephone Encounter (Signed)
Okay noted

## 2018-09-13 NOTE — Progress Notes (Signed)
Greg [name] - 85 y.o. male MRN [id number]  Date of birth: 1933/07/05  Office Visit Note: Visit Date: 09/12/2018 PCP: Greg Sell, NP Referred by: Greg Sell, NP  Subjective: Chief Complaint  Patient presents with  . Lower Back - Pain   HPI:  Greg [name] is a 85 y.o. male who comes in today For planned repeat L2-3 interlaminar epidural steroid injection for mild to moderate stenosis at this level.  His symptoms continue to be similar to lumbar stenosis with claudication.  Worsening with standing and walking and having to sit down.  He is followed by Dr. Rodell [name].  Patient has had diagnostic sacroiliac joint and facet joint blocks which were not very beneficial.  Epidural injection has been a little bit sporadic and that he has had a couple of these with a really profound relief for several months.  Last injection was the end of October and by the end of December he is having return of symptoms.  I still think is worthwhile to repeat one more from an interlaminar approach.  He is very difficult to get in the interlaminar epidural space just do to bony stenosis but we have been able to do that.  Depending on the outcome at least one time would look at transforaminal epidural at L2.  He also has a transitional segment with riblets defined probably at the T12 area.  MRI does not really help at numbering although it numbers the last open space as L5-S1.  ROS Otherwise per HPI.  Assessment & Plan: Visit Diagnoses:  1. Lumbar radiculopathy   2. Spinal stenosis of lumbar region, unspecified whether neurogenic claudication present     Plan: No additional findings.   Meds & Orders:  Meds ordered this encounter  Medications  . methylPREDNISolone acetate (DEPO-MEDROL) injection 80 mg    Orders Placed This Encounter  Procedures  . XR C-ARM NO REPORT  . Epidural Steroid injection    Follow-up: Return if symptoms worsen or fail to improve, for Greg Perna, MD.    Procedures: No procedures performed  Lumbar Epidural Steroid Injection - Interlaminar Approach with Fluoroscopic Guidance  [name]      Date of Birth: [birth year] MRN: [id number] PCP: Greg Sell, NP      Visit Date: 09/12/2018   Universal Protocol:     Consent Given By: the patient  Position: PRONE  Additional Comments: Vital signs were monitored before and after the procedure. Patient was prepped and draped in the usual sterile fashion. The correct patient, procedure, and site was verified.   Injection Procedure Details:  Procedure Site One Meds Administered:  Meds ordered this encounter  Medications  . methylPREDNISolone acetate (DEPO-MEDROL) injection 80 mg     Laterality: Left  Location/Site: Lumbarized S1 segment. L2-L3  Needle size: 20 G  Needle type: Tuohy  Needle Placement: Paramedian epidural  Findings:   -Comments: Excellent flow of contrast into the epidural space.  Procedure Details: Using a paramedian approach from the side mentioned above, the region overlying the inferior lamina was localized under fluoroscopic visualization and the soft tissues overlying this structure were infiltrated with 4 ml. of 1% Lidocaine without Epinephrine. The Tuohy needle was inserted into the epidural space using a paramedian approach.   The epidural space was localized using loss of resistance along with lateral and bi-planar fluoroscopic views.  After negative aspirate for air, blood, and CSF, a 2 ml. volume of Isovue-250 was injected into the epidural  space and the flow of contrast was observed. Radiographs were obtained for documentation purposes.    The injectate was administered into the level noted above.   Additional Comments:  The patient tolerated the procedure well Dressing: Band-Aid    Post-procedure details: Patient was observed during the procedure. Post-procedure instructions were reviewed.  Patient left the clinic in  stable condition.   Clinical History: MRI LUMBAR SPINE WITHOUT CONTRAST  TECHNIQUE: Multiplanar, multisequence MR imaging of the lumbar spine was performed. No intravenous contrast was administered.  COMPARISON:  Lumbar radiographs 09/10/2016  FINDINGS: Segmentation:  Normal.  Lowest fully developed disc space L5-S1.  Alignment:  Lumbar dextroscoliosis.  Mild anterolisthesis L4-5  Vertebrae: Negative for fracture or mass lesion. Normal bone marrow.  Conus medullaris: Extends to the L1-2 level and appears normal.  Paraspinal and other soft tissues: Left renal cyst. No retroperitoneal adenopathy. Paraspinous muscles are symmetric.  Disc levels:  L1-2:  Normal disc space.  Mild facet degeneration  L2-3: Disc degeneration with diffuse bulging of the disc. Moderate facet and ligamentum flavum hypertrophy. Mild to moderate spinal stenosis and mild subarticular stenosis bilaterally.  L3-4: Disc degeneration with disc space narrowing and mild disc bulging. Mild facet hypertrophy. No significant canal or foraminal stenosis.  L4-5: Mild anterior slip. Moderate facet degeneration. Moderate disc degeneration. No significant stenosis.  L5-S1: Negative  IMPRESSION: Lumbar dextroscoliosis.  Negative for fracture  Mild to moderate spinal stenosis L2-3 secondary to disc and facet degeneration. Mild subarticular stenosis bilaterally.  Mild disc and facet degeneration L3-4  Grade 1 anterior slip L4-5. Disc and facet degeneration without stenosis.   Electronically Signed   By: Franchot Gallo M.D.   On: 09/22/2016 15:02     Objective:  VS:  HT:    WT:   BMI:     BP:(!) 169/95  HR:(!) 55bpm  TEMP:97.6 F (36.4 C)(Oral)  RESP:  Physical Exam  Ortho Exam Imaging: Xr C-arm No Report  Result Date: 09/12/2018 Please see Notes tab for imaging impression.
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
## 2018-09-27 ENCOUNTER — Telehealth (INDEPENDENT_AMBULATORY_CARE_PROVIDER_SITE_OTHER): Payer: Self-pay | Admitting: Orthopaedic Surgery

## 2018-09-27 NOTE — Telephone Encounter (Signed)
Patient came into the clinic to check on his handicap placard application that he dropped off last week for Dr. Lorin Mercy.  Please Advise. [phone].  Thank you.

## 2018-09-29 ENCOUNTER — Ambulatory Visit
Admission: RE | Admit: 2018-09-29 | Discharge: 2018-09-29 | Disposition: A | Discharge status: Home / Self Care | Payer: Medicare Other | Source: Ambulatory Visit | Attending: Neurology | Admitting: Neurology

## 2018-09-29 DIAGNOSIS — R413 Other amnesia: Secondary | ICD-10-CM | POA: Diagnosis not present

## 2018-09-29 NOTE — Telephone Encounter (Signed)
I left voicemail for patient. I did not receive handicap placard, however, Dr. Lorin Mercy has completed one at my desk. I need to know if the handicap placard he brought in was for a permanent placard. I asked for return call.

## 2018-10-02 ENCOUNTER — Telehealth: Payer: Self-pay | Admitting: Neurology

## 2018-10-02 NOTE — Telephone Encounter (Signed)
Please call patient, MRI of the brain showed age-related changes, no acute abnormality, I will review films with him at next follow-up visit.  IMPRESSION:   MRI brain (without) demonstrating: - moderate perisylvian atrophy. - mild chronic small vessel ischemic disease. - no acute findings.

## 2018-10-02 NOTE — Telephone Encounter (Signed)
Patient came in to office this morning and picked up handicap placard.

## 2018-10-02 NOTE — Telephone Encounter (Signed)
Spoke to patient and provided him with the MRI results below.  He verbalized understanding.

## 2018-10-07 ENCOUNTER — Other Ambulatory Visit: Payer: Self-pay | Admitting: Nurse Practitioner

## 2018-10-09 ENCOUNTER — Other Ambulatory Visit (INDEPENDENT_AMBULATORY_CARE_PROVIDER_SITE_OTHER): Payer: Medicare Other

## 2018-10-09 ENCOUNTER — Encounter: Payer: Self-pay | Admitting: Nurse Practitioner

## 2018-10-09 ENCOUNTER — Ambulatory Visit (INDEPENDENT_AMBULATORY_CARE_PROVIDER_SITE_OTHER): Payer: Medicare Other | Admitting: Nurse Practitioner

## 2018-10-09 VITALS — BP 120/76 | HR 82 | Temp 98.1°F | Resp 16 | Ht 69.0 in | Wt 188.0 lb

## 2018-10-09 DIAGNOSIS — Z0000 Encounter for general adult medical examination without abnormal findings: Secondary | ICD-10-CM

## 2018-10-09 DIAGNOSIS — N183 Chronic kidney disease, stage 3 unspecified: Secondary | ICD-10-CM

## 2018-10-09 DIAGNOSIS — R7309 Other abnormal glucose: Secondary | ICD-10-CM

## 2018-10-09 DIAGNOSIS — K2100 Gastro-esophageal reflux disease with esophagitis, without bleeding: Secondary | ICD-10-CM

## 2018-10-09 DIAGNOSIS — D638 Anemia in other chronic diseases classified elsewhere: Secondary | ICD-10-CM

## 2018-10-09 DIAGNOSIS — E039 Hypothyroidism, unspecified: Secondary | ICD-10-CM

## 2018-10-09 DIAGNOSIS — E7800 Pure hypercholesterolemia, unspecified: Secondary | ICD-10-CM

## 2018-10-09 DIAGNOSIS — N401 Enlarged prostate with lower urinary tract symptoms: Secondary | ICD-10-CM

## 2018-10-09 DIAGNOSIS — I10 Essential (primary) hypertension: Secondary | ICD-10-CM

## 2018-10-09 DIAGNOSIS — N138 Other obstructive and reflux uropathy: Secondary | ICD-10-CM

## 2018-10-09 LAB — LIPID PANEL
Cholesterol: 139 mg/dL (ref 0–200)
HDL: 54.7 mg/dL (ref >39.00)
LDL CALC: 70 mg/dL (ref 0–99)
NonHDL: 84.37
Total CHOL/HDL Ratio: 3
Triglycerides: 74 mg/dL (ref 0.0–149.0)
VLDL: 14.8 mg/dL (ref 0.0–40.0)

## 2018-10-09 LAB — TSH: TSH: 2.28 u[IU]/mL (ref 0.35–4.50)

## 2018-10-09 LAB — HEMOGLOBIN A1C: Hgb A1c MFr Bld: 6.5 % (ref 4.6–6.5)

## 2018-10-09 NOTE — Patient Instructions (Addendum)
Head downstairs for labs today   I will see you back in 6 months, or sooner if needed   Health Maintenance After Age 83 After age 67, you are at a higher risk for certain long-term diseases and infections as well as injuries from falls. Falls are a major cause of broken bones and head injuries in people who are older than age 90. Getting regular preventive care can help to keep you healthy and well. Preventive care includes getting regular testing and making lifestyle changes as recommended by your health care provider. Talk with your health care provider about:  Which screenings and tests you should have. A screening is a test that checks for a disease when you have no symptoms.  A diet and exercise plan that is right for you. What should I know about screenings and tests to prevent falls? Screening and testing are the best ways to find a health problem early. Early diagnosis and treatment give you the best chance of managing medical conditions that are common after age 32. Certain conditions and lifestyle choices may make you more likely to have a fall. Your health care provider may recommend:  Regular vision checks. Poor vision and conditions such as cataracts can make you more likely to have a fall. If you wear glasses, make sure to get your prescription updated if your vision changes.  Medicine review. Work with your health care provider to regularly review all of the medicines you are taking, including over-the-counter medicines. Ask your health care provider about any side effects that may make you more likely to have a fall. Tell your health care provider if any medicines that you take make you feel dizzy or sleepy.  Osteoporosis screening. Osteoporosis is a condition that causes the bones to get weaker. This can make the bones weak and cause them to break more easily.  Blood pressure screening. Blood pressure changes and medicines to control blood pressure can make you feel  dizzy.  Strength and balance checks. Your health care provider may recommend certain tests to check your strength and balance while standing, walking, or changing positions.  Foot health exam. Foot pain and numbness, as well as not wearing proper footwear, can make you more likely to have a fall.  Depression screening. You may be more likely to have a fall if you have a fear of falling, feel emotionally low, or feel unable to do activities that you used to do.  Alcohol use screening. Using too much alcohol can affect your balance and may make you more likely to have a fall. What actions can I take to lower my risk of falls? General instructions  Talk with your health care provider about your risks for falling. Tell your health care provider if: ? You fall. Be sure to tell your health care provider about all falls, even ones that seem minor. ? You feel dizzy, sleepy, or off-balance.  Take over-the-counter and prescription medicines only as told by your health care provider. These include any supplements.  Eat a healthy diet and maintain a healthy weight. A healthy diet includes low-fat dairy products, low-fat (lean) meats, and fiber from whole grains, beans, and lots of fruits and vegetables. Home safety  Remove any tripping hazards, such as rugs, cords, and clutter.  Install safety equipment such as grab bars in bathrooms and safety rails on stairs.  Keep rooms and walkways well-lit. Activity   Follow a regular exercise program to stay fit. This will help you maintain your  balance. Ask your health care provider what types of exercise are appropriate for you.  If you need a cane or walker, use it as recommended by your health care provider.  Wear supportive shoes that have nonskid soles. Lifestyle  Do not drink alcohol if your health care provider tells you not to drink.  If you drink alcohol, limit how much you have: ? 0-1 drink a day for women. ? 0-2 drinks a day for  men.  Be aware of how much alcohol is in your drink. In the [place] one drink equals one typical bottle of beer (12 oz), one-half glass of wine (5 oz), or one shot of hard liquor (1 oz).  Do not use any products that contain nicotine or tobacco, such as cigarettes and e-cigarettes. If you need help quitting, ask your health care provider. Summary  Having a healthy lifestyle and getting preventive care can help to protect your health and wellness after age 77.  Screening and testing are the best way to find a health problem early and help you avoid having a fall. Early diagnosis and treatment give you the best chance for managing medical conditions that are more common for people who are older than age 75.  Falls are a major cause of broken bones and head injuries in people who are older than age 51. Take precautions to prevent a fall at home.  Work with your health care provider to learn what changes you can make to improve your health and wellness and to prevent falls. This information is not intended to replace advice given to you by your health care provider. Make sure you discuss any questions you have with your health care provider. Document Released: 06/29/2017 Document Revised: 06/29/2017 Document Reviewed: 06/29/2017 Elsevier Interactive Patient Education  2019 [employer].

## 2018-10-09 NOTE — Assessment & Plan Note (Signed)
Continue levothyroxine at current dosage Update labs F/U with further recommendations pending lab results - TSH; Future

## 2018-10-09 NOTE — Assessment & Plan Note (Signed)
Continue simvastatin Update labs- he is fasting - Lipid panel; Future - Hemoglobin A1c; Future

## 2018-10-09 NOTE — Assessment & Plan Note (Signed)
Continue regular f/u with oncology
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
## 2018-10-09 NOTE — Assessment & Plan Note (Signed)
Stable Continue current medications Continue to monitor - Lipid panel; Future - TSH; Future

## 2018-10-09 NOTE — Assessment & Plan Note (Signed)
Stable Continue zantac prn F/u for new, worsening symptoms

## 2018-10-09 NOTE — Assessment & Plan Note (Signed)
Continue regular f/u with urology

## 2018-10-09 NOTE — Assessment & Plan Note (Signed)
Stable Continue regular F/U with nephrology

## 2018-10-09 NOTE — Assessment & Plan Note (Signed)
Reviewed annual screening exams, healthy lifestyle, additional information provided on AVS - Lipid panel; Future - TSH; Future - Hemoglobin A1c; Future

## 2018-10-09 NOTE — Assessment & Plan Note (Signed)
Update labs F/U with further recommendations pending lab results - Lipid panel; Future - Hemoglobin A1c; Future

## 2018-10-11 ENCOUNTER — Other Ambulatory Visit: Payer: Self-pay | Admitting: Nurse Practitioner

## 2018-10-11 ENCOUNTER — Ambulatory Visit: Payer: Medicare Other | Admitting: Nurse Practitioner

## 2018-10-11 DIAGNOSIS — E119 Type 2 diabetes mellitus without complications: Secondary | ICD-10-CM

## 2018-10-24 ENCOUNTER — Telehealth: Payer: Self-pay | Admitting: *Deleted

## 2018-10-24 NOTE — Telephone Encounter (Signed)
Copied from [place] [id number]. Topic: Referral - Status >> Oct 24, 2018  1:22 PM Reyne Dumas L wrote: Reason for CRM:   Pt states that Greg [name] was supposed to be finding him someone to talk to about his blood sugar and what he could eat but he hasn't heard from anyone.  Pt is calling to check on this.  He thinks that she may have been waiting on his insurance. Pt can be reached at [phone]

## 2018-10-24 NOTE — Telephone Encounter (Signed)
Left detailed message informing pt he can call Nutrition and diabetic education office directly at [phone].

## 2018-10-25 ENCOUNTER — Encounter: Payer: Medicare Other | Attending: Nurse Practitioner | Admitting: *Deleted

## 2018-10-25 DIAGNOSIS — E119 Type 2 diabetes mellitus without complications: Secondary | ICD-10-CM | POA: Insufficient documentation

## 2018-10-25 NOTE — Patient Instructions (Signed)
Plan:  Aim for 3 Carb Choices per meal (45 grams) +/- 1 either way  Aim for 0-2 Carbs per snack if hungry  Include protein in moderation with your meals and snacks Consider  increasing your activity level by walking or other activity for 15 minutes daily as tolerated Consider getting a meter so you can check blood sugars at alternate times per day

## 2018-11-01 NOTE — Progress Notes (Signed)
Diabetes Self-Management Education  Visit Type: First/Initial  Appt. Start Time: 1100 Appt. End Time: 1230  11/01/2018  Greg [name], identified by name and date of birth, is a 85 y.o. male with a diagnosis of Diabetes: Type 2. Patient is newly diagnosed, he gets some exercise with yard work regularly but is limited with some knee pain. He would like to learn more about what foods to eat with diabetes as well as Stage 3 kidney disease.  ASSESSMENT  Height 5' 9.5" (1.765 m), weight 183 lb 9.6 oz (83.3 kg). Body mass index is 26.72 kg/m.  Diabetes Self-Management Education - 10/25/18 1105      Visit Information   Visit Type  First/Initial      Initial Visit   Diabetes Type  Type 2    Are you currently following a meal plan?  No    Are you taking your medications as prescribed?  Yes    Date Diagnosed  09/2018      Health Coping   How would you rate your overall health?  Fair      Psychosocial Assessment   Patient Belief/Attitude about Diabetes  Motivated to manage diabetes    Self-care barriers  Low literacy    Other persons present  Patient    Patient Concerns  Nutrition/Meal planning;Glycemic Control    Special Needs  Simplified materials    Preferred Learning Style  Hands on;Visual    Learning Readiness  Change in progress    How often do you need to have someone help you when you read instructions, pamphlets, or other written materials from your doctor or pharmacy?  1 - Never    What is the last grade level you completed in school?  6      Pre-Education Assessment   Patient understands the diabetes disease and treatment process.  Needs Instruction    Patient understands incorporating nutritional management into lifestyle.  Needs Instruction    Patient undertands incorporating physical activity into lifestyle.  Needs Instruction    Patient understands using medications safely.  Needs Instruction    Patient understands monitoring blood glucose, interpreting and using  results  Needs Instruction    Patient understands prevention, detection, and treatment of acute complications.  Needs Instruction    Patient understands prevention, detection, and treatment of chronic complications.  Needs Instruction    Patient understands how to develop strategies to address psychosocial issues.  Needs Instruction    Patient understands how to develop strategies to promote health/change behavior.  Needs Instruction      Complications   Last HgB A1C per patient/outside source  6.5 %    How often do you check your blood sugar?  1-2 times/day    Fasting Blood glucose range (mg/dL)  70-129    Number of hypoglycemic episodes per month  0    Have you had a dilated eye exam in the past 12 months?  Yes    Have you had a dental exam in the past 12 months?  Yes      Dietary Intake   Breakfast  7:30- skips OR Ensure OR chicken salad on crackers OR would enjoy sausage, eggs, bacon OR grits with eggs OR package or two of PNB crackers    Snack (morning)  not usually     Lunch  dinner meal: grilled chicken, salad or slaw, green beans OR [employer] of Fridays -fried  fish and salad only    Dinner  used to eat chocolate, has  switched to grapes or tangerines OR peanuts    Beverage(s)  coffee with sweet and low, diet Sprite, water x 2 per day,       Exercise   Exercise Type  ADL's   does own yard work, cuts own grass, all house work now too.   How many days per week to you exercise?  3    How many minutes per day do you exercise?  30    Total minutes per week of exercise  90      Patient Education   Disease state   Factors that contribute to the development of diabetes    Nutrition management   Role of diet in the treatment of diabetes and the relationship between the three main macronutrients and blood glucose level;Reviewed blood glucose goals for pre and post meals and how to evaluate the patients' food intake on their blood glucose level.;Carbohydrate counting    Physical  activity and exercise   Role of exercise on diabetes management, blood pressure control and cardiac health.    Monitoring  Purpose and frequency of SMBG.;Identified appropriate SMBG and/or A1C goals.    Chronic complications  Relationship between chronic complications and blood glucose control    Psychosocial adjustment  Role of stress on diabetes      Individualized Goals (developed by patient)   Nutrition  General guidelines for healthy choices and portions discussed    Physical Activity  Exercise 3-5 times per week    Medications  Not Applicable    Monitoring   test blood glucose pre and post meals as discussed      Post-Education Assessment   Patient understands the diabetes disease and treatment process.  Demonstrates understanding / competency    Patient understands incorporating nutritional management into lifestyle.  Demonstrates understanding / competency    Patient undertands incorporating physical activity into lifestyle.  Demonstrates understanding / competency    Patient understands monitoring blood glucose, interpreting and using results  Demonstrates understanding / competency    Patient understands prevention, detection, and treatment of chronic complications.  Demonstrates understanding / competency      Outcomes   Expected Outcomes  Demonstrated interest in learning. Expect positive outcomes    Future DMSE  PRN    Program Status  Completed       Individualized Plan for Diabetes Self-Management Training:   Learning Objective:  Patient will have a greater understanding of diabetes self-management. Patient education plan is to attend individual and/or group sessions per assessed needs and concerns.   Plan:   Patient Instructions  Plan:  Aim for 3 Carb Choices per meal (45 grams) +/- 1 either way  Aim for 0-2 Carbs per snack if hungry  Include protein in moderation with your meals and snacks Consider  increasing your activity level by walking or other activity for  15 minutes daily as tolerated Consider getting a meter so you can check blood sugars at alternate times per day   Expected Outcomes:  Demonstrated interest in learning. Expect positive outcomes  Education material provided: Food label handouts, A1C conversion sheet, Meal plan card and Carbohydrate counting sheet  If problems or questions, patient to contact team via:  Phone  Future DSME appointment: PRN

## 2018-11-02 ENCOUNTER — Telehealth: Payer: Self-pay | Admitting: *Deleted

## 2018-11-02 MED ORDER — ACCU-CHEK SOFT TOUCH LANCETS MISC: 1.0000 | Freq: Every day | 1 refills | Status: DC | PRN

## 2018-11-02 MED ORDER — ACCU-CHEK GUIDE W/DEVICE KIT: 1.0000 | PACK | 0 refills | Status: DC

## 2018-11-02 MED ORDER — GLUCOSE BLOOD VI STRP: 1.0000 | ORAL_STRIP | Freq: Every day | 1 refills | Status: DC | PRN

## 2018-11-02 NOTE — Telephone Encounter (Signed)
Copied from [place] [id number]. Topic: General - Other >> Nov 02, 2018 10:05 AM Jodie Echevaria wrote: Reason for CRM: Patient called to request an Rx sent to his pharmacy on file for an Accu check meter kit with everything needed to test blood sugar. Rx need to state how often he will check his sugar also please advise  and inform patient when Rx have been sent.

## 2018-11-02 NOTE — Telephone Encounter (Signed)
News Rxs sent. See meds. Left detailed message informing pt.

## 2018-11-08 ENCOUNTER — Other Ambulatory Visit: Payer: Self-pay | Admitting: Nurse Practitioner

## 2018-12-04 ENCOUNTER — Other Ambulatory Visit: Payer: Self-pay | Admitting: *Deleted

## 2018-12-04 MED ORDER — TAMSULOSIN HCL 0.4 MG PO CAPS: 0.4000 mg | ORAL_CAPSULE | Freq: Every day | ORAL | 1 refills | Status: DC

## 2018-12-04 MED ORDER — SIMVASTATIN 40 MG PO TABS: ORAL_TABLET | ORAL | 1 refills | Status: DC

## 2018-12-04 MED ORDER — LABETALOL HCL 200 MG PO TABS: ORAL_TABLET | ORAL | 1 refills | Status: DC

## 2018-12-04 MED ORDER — LEVOTHYROXINE SODIUM 50 MCG PO TABS: 50.0000 ug | ORAL_TABLET | Freq: Every day | ORAL | 1 refills | Status: DC

## 2018-12-06 ENCOUNTER — Telehealth: Payer: Self-pay | Admitting: *Deleted

## 2018-12-06 NOTE — Telephone Encounter (Signed)
I called pt- I advised him his tamsulosin, labetalol, levothyroxine and simvastatin were sent to [facility] on 12/04/18. I advised him to contact them for estimated shipment and costs. I also informed him he may have to p/u a short term supply of his tamsulosin at the local pharmacy if he is almost out. Pt verbalized understanding.

## 2018-12-11 ENCOUNTER — Encounter (INDEPENDENT_AMBULATORY_CARE_PROVIDER_SITE_OTHER): Payer: Medicare Other | Admitting: Physical Medicine and Rehabilitation

## 2018-12-20 ENCOUNTER — Encounter (INDEPENDENT_AMBULATORY_CARE_PROVIDER_SITE_OTHER): Payer: Medicare Other | Admitting: Physical Medicine and Rehabilitation
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
## 2018-12-25 ENCOUNTER — Other Ambulatory Visit: Payer: Self-pay

## 2018-12-25 ENCOUNTER — Inpatient Hospital Stay: Payer: Medicare Other | Attending: Hematology

## 2018-12-25 ENCOUNTER — Inpatient Hospital Stay (HOSPITAL_BASED_OUTPATIENT_CLINIC_OR_DEPARTMENT_OTHER): Payer: Medicare Other | Admitting: Hematology

## 2018-12-25 ENCOUNTER — Telehealth: Payer: Self-pay | Admitting: Hematology

## 2018-12-25 VITALS — BP 174/87 | HR 86 | Temp 98.0°F | Resp 18 | Ht 69.5 in | Wt 185.1 lb

## 2018-12-25 DIAGNOSIS — I129 Hypertensive chronic kidney disease with stage 1 through stage 4 chronic kidney disease, or unspecified chronic kidney disease: Secondary | ICD-10-CM | POA: Insufficient documentation

## 2018-12-25 DIAGNOSIS — D649 Anemia, unspecified: Secondary | ICD-10-CM

## 2018-12-25 DIAGNOSIS — D472 Monoclonal gammopathy: Secondary | ICD-10-CM

## 2018-12-25 DIAGNOSIS — N183 Chronic kidney disease, stage 3 (moderate): Secondary | ICD-10-CM | POA: Diagnosis not present

## 2018-12-25 DIAGNOSIS — D631 Anemia in chronic kidney disease: Secondary | ICD-10-CM | POA: Insufficient documentation

## 2018-12-25 LAB — CMP (CANCER CENTER ONLY)
ALT: 20 U/L (ref 0–44)
AST: 21 U/L (ref 15–41)
Albumin: 3.7 g/dL (ref 3.5–5.0)
Alkaline Phosphatase: 66 U/L (ref 38–126)
Anion gap: 9 (ref 5–15)
BUN: 28 mg/dL — ABNORMAL HIGH (ref 8–23)
CO2: 21 mmol/L — ABNORMAL LOW (ref 22–32)
Calcium: 9.4 mg/dL (ref 8.9–10.3)
Chloride: 110 mmol/L (ref 98–111)
Creatinine: 2.13 mg/dL — ABNORMAL HIGH (ref 0.61–1.24)
GFR, Est AFR Am: 32 mL/min — ABNORMAL LOW (ref >60)
GFR, Estimated: 28 mL/min — ABNORMAL LOW (ref >60)
Glucose, Bld: 105 mg/dL — ABNORMAL HIGH (ref 70–99)
Potassium: 5 mmol/L (ref 3.5–5.1)
Sodium: 140 mmol/L (ref 135–145)
Total Bilirubin: 0.2 mg/dL — ABNORMAL LOW (ref 0.3–1.2)
Total Protein: 7.3 g/dL (ref 6.5–8.1)

## 2018-12-25 LAB — CBC WITH DIFFERENTIAL/PLATELET
Abs Immature Granulocytes: 0.01 10*3/uL (ref 0.00–0.07)
Basophils Absolute: 0 10*3/uL (ref 0.0–0.1)
Basophils Relative: 1 %
Eosinophils Absolute: 0.1 10*3/uL (ref 0.0–0.5)
Eosinophils Relative: 3 %
HCT: 32.5 % — ABNORMAL LOW (ref 39.0–52.0)
Hemoglobin: 10.4 g/dL — ABNORMAL LOW (ref 13.0–17.0)
Immature Granulocytes: 0 %
Lymphocytes Relative: 28 %
Lymphs Abs: 1.2 10*3/uL (ref 0.7–4.0)
MCH: 31.6 pg (ref 26.0–34.0)
MCHC: 32 g/dL (ref 30.0–36.0)
MCV: 98.8 fL (ref 80.0–100.0)
Monocytes Absolute: 0.5 10*3/uL (ref 0.1–1.0)
Monocytes Relative: 12 %
Neutro Abs: 2.4 10*3/uL (ref 1.7–7.7)
Neutrophils Relative %: 56 %
Platelets: 160 10*3/uL (ref 150–400)
RBC: 3.29 MIL/uL — ABNORMAL LOW (ref 4.22–5.81)
RDW: 12.8 % (ref 11.5–15.5)
WBC: 4.3 10*3/uL (ref 4.0–10.5)
nRBC: 0 % (ref 0.0–0.2)

## 2018-12-25 NOTE — Telephone Encounter (Signed)
Scheduled appt per 4/27 los.  A calendar will be mailed out.

## 2018-12-26 LAB — MULTIPLE MYELOMA PANEL, SERUM
Albumin SerPl Elph-Mcnc: 4 g/dL (ref 2.9–4.4)
Albumin/Glob SerPl: 1.7 (ref 0.7–1.7)
Alpha 1: 0.2 g/dL (ref 0.0–0.4)
Alpha2 Glob SerPl Elph-Mcnc: 0.7 g/dL (ref 0.4–1.0)
B-Globulin SerPl Elph-Mcnc: 0.9 g/dL (ref 0.7–1.3)
Gamma Glob SerPl Elph-Mcnc: 0.7 g/dL (ref 0.4–1.8)
Globulin, Total: 2.5 g/dL (ref 2.2–3.9)
IgA: 230 mg/dL (ref 61–437)
IgG (Immunoglobin G), Serum: 916 mg/dL (ref 603–1613)
IgM (Immunoglobulin M), Srm: 46 mg/dL (ref 15–143)
M Protein SerPl Elph-Mcnc: 0.2 g/dL — ABNORMAL HIGH
Total Protein ELP: 6.5 g/dL (ref 6.0–8.5)

## 2018-12-26 LAB — KAPPA/LAMBDA LIGHT CHAINS
Kappa free light chain: 51.2 mg/L — ABNORMAL HIGH (ref 3.3–19.4)
Kappa, lambda light chain ratio: 2.29 — ABNORMAL HIGH (ref 0.26–1.65)
Lambda free light chains: 22.4 mg/L (ref 5.7–26.3)

## 2019-01-09 ENCOUNTER — Telehealth: Payer: Self-pay | Admitting: Nurse Practitioner

## 2019-01-09 MED ORDER — FAMOTIDINE 20 MG PO TABS: 20.0000 mg | ORAL_TABLET | Freq: Two times a day (BID) | ORAL | 0 refills | Status: DC

## 2019-01-09 NOTE — Telephone Encounter (Signed)
Greg [name] is no longer here pls advise on msg below.Marland KitchenJohny [name]

## 2019-01-09 NOTE — Telephone Encounter (Signed)
Copied from [place] [id number]. Topic: Quick Communication - Rx Refill/Question >> Jan 09, 2019 10:04 AM [name] wrote: Medication: ranitidine (ZANTAC) 150 MG tablet  - pt called his pharmacy for a refill and was told this medicine is no longer available - needs 90 day supply of new med Greg [name] pt)  Has the patient contacted their pharmacy? Yes but also noted pharmacy change  Preferred Pharmacy (with phone number or street name): [place], [place] [employer] [phone] (Phone) [phone] (Fax)

## 2019-01-09 NOTE — Telephone Encounter (Signed)
Notified pt Mickel Baas [facility].Marland KitchenJohny Chess

## 2019-01-09 NOTE — Telephone Encounter (Signed)
Changing to Famotidine 20 mg bid; Rx sent to mail order pharmacy.

## 2019-01-10 ENCOUNTER — Ambulatory Visit: Payer: Self-pay

## 2019-01-10 ENCOUNTER — Ambulatory Visit (INDEPENDENT_AMBULATORY_CARE_PROVIDER_SITE_OTHER): Payer: Medicare Other | Admitting: Physical Medicine and Rehabilitation

## 2019-01-10 ENCOUNTER — Other Ambulatory Visit: Payer: Self-pay

## 2019-01-10 VITALS — BP 155/95 | HR 72

## 2019-01-10 DIAGNOSIS — M48061 Spinal stenosis, lumbar region without neurogenic claudication: Secondary | ICD-10-CM

## 2019-01-10 DIAGNOSIS — M5416 Radiculopathy, lumbar region: Secondary | ICD-10-CM

## 2019-01-10 MED ORDER — METHYLPREDNISOLONE ACETATE 80 MG/ML IJ SUSP
80.0000 mg | Freq: Once | INTRAMUSCULAR | Status: AC
  Administered 2019-01-10: 80 mg

## 2019-01-10 NOTE — Progress Notes (Signed)
 .  Numeric Pain Rating Scale and Functional Assessment Average Pain 6   In the last MONTH (on 0-10 scale) has pain interfered with the following?  1. General activity like being  able to carry out your everyday physical activities such as walking, climbing stairs, carrying groceries, or moving a chair?  Rating(4)   +Driver, -BT, -Dye Allergies.  

## 2019-01-31 ENCOUNTER — Telehealth: Payer: Self-pay | Admitting: Physical Medicine and Rehabilitation

## 2019-01-31 NOTE — Telephone Encounter (Signed)
Patient had injection awhile back, and it did not seem to help like the other ones did before. Patient request a  call back to discuss this.

## 2019-01-31 NOTE — Telephone Encounter (Signed)
Bilateral L@ tf esi, we could not get in at L2-3 with interlam approach, very hard every time. Sometimes helps and sometimes not. I would try TF approach but at L2. Could try interlam lower.

## 2019-01-31 NOTE — Telephone Encounter (Signed)
Please advise 

## 2019-02-01 NOTE — Telephone Encounter (Signed)
Notification or Prior Authorization is not required for the requested services  This [employer] Medicare Advantage members plan does not currently require a prior authorization for [phone]  Decision ID #:[id number]

## 2019-02-01 NOTE — Telephone Encounter (Signed)
Is auth needed for bilateral [phone]? Scheduled for 6/17.

## 2019-02-08 NOTE — Progress Notes (Signed)
Greg [name] - 85 y.o. male MRN [id number]  Date of birth: 04-15-1933  Office Visit Note: Visit Date: 01/10/2019 PCP: Lance Sell, NP Referred by: Lance Sell, NP  Subjective: Chief Complaint  Patient presents with  . Lower Back - Pain  . Right Leg - Pain  . Left Leg - Pain   HPI:  Greg [name] is a 85 y.o. male who comes in today For planned repeat L2-3 interlaminar epidural steroid injection for lumbar stenosis and bilateral low back and radicular type leg pain with claudication.  Stenosis is moderate.  Prior injection in November was beneficial.  He is a difficult injection from a bony arthritic standpoint and positioning for that L2-3 region.  As you will see in the procedure note we did have to convert this today to a transforaminal approach and I hope this does seem to help.  This was done at the L3 level.  If he does not get relief would look at L2 transforaminal.  He has had good relief in the past and this injection was diagnostic and hopefully therapeutic.  ROS Otherwise per HPI.  Assessment & Plan: Visit Diagnoses:  1. Lumbar radiculopathy   2. Spinal stenosis of lumbar region, unspecified whether neurogenic claudication present     Plan: No additional findings.   Meds & Orders:  Meds ordered this encounter  Medications  . methylPREDNISolone acetate (DEPO-MEDROL) injection 80 mg    Orders Placed This Encounter  Procedures  . XR C-ARM NO REPORT  . Epidural Steroid injection    Follow-up: Return if symptoms worsen or fail to improve.   Procedures: No procedures performed  Lumbosacral Transforaminal Epidural Steroid Injection - Sub-Pedicular Approach with Fluoroscopic Guidance  [name]      Date of Birth: 1933-02-02 MRN: [id number] PCP: Lance Sell, NP      Visit Date: 01/10/2019   Universal Protocol:    Date/Time: 01/10/2019  Consent Given By: the patient  Position: PRONE  Additional Comments: Vital signs  were monitored before and after the procedure. Patient was prepped and draped in the usual sterile fashion. The correct patient, procedure, and site was verified.   Injection Procedure Details:  Procedure Site One Meds Administered:  Meds ordered this encounter  Medications  . methylPREDNISolone acetate (DEPO-MEDROL) injection 80 mg    Laterality: Left  Location/Site:  L3-L4  Needle size: 22 G  Needle type: Spinal  Needle Placement: Transforaminal  Findings:    -Comments: Excellent flow of contrast along the nerve and into the epidural space.  Initially we attempted repeat of L2-3 interlaminar epidural steroid injection.  Do to spondylitic arthrosis and narrowing from a bony standpoint he is very hard to get good loss of resistance at that level.  Despite multiple attempts at repositioning and trying to get more of a flexed lower lumbar area we just could not get loss of resistance and converted the injection to a transforaminal approach.  If this does not help I would look at an L2 transforaminal injection.  Procedure Details: After squaring off the end-plates to get a true AP view, the C-arm was positioned so that an oblique view of the foramen as noted above was visualized. The target area is just inferior to the "nose of the scotty dog" or sub pedicular. The soft tissues overlying this structure were infiltrated with 2-3 ml. of 1% Lidocaine without Epinephrine.  The spinal needle was inserted toward the target using a "trajectory" view along the  fluoroscope beam.  Under AP and lateral visualization, the needle was advanced so it did not puncture dura and was located close the 6 O'Clock position of the pedical in AP tracterory. Biplanar projections were used to confirm position. Aspiration was confirmed to be negative for CSF and/or blood. A 1-2 ml. volume of Isovue-250 was injected and flow of contrast was noted at each level. Radiographs were obtained for documentation purposes.    After attaining the desired flow of contrast documented above, a 0.5 to 1.0 ml test dose of 0.25% Marcaine was injected into each respective transforaminal space.  The patient was observed for 90 seconds post injection.  After no sensory deficits were reported, and normal lower extremity motor function was noted,   the above injectate was administered so that equal amounts of the injectate were placed at each foramen (level) into the transforaminal epidural space.   Additional Comments:   Dressing: 2 x 2 sterile gauze and Band-Aid    Post-procedure details: Patient was observed during the procedure. Post-procedure instructions were reviewed.  Patient left the clinic in stable condition.     Clinical History: MRI LUMBAR SPINE WITHOUT CONTRAST  TECHNIQUE: Multiplanar, multisequence MR imaging of the lumbar spine was performed. No intravenous contrast was administered.  COMPARISON:  Lumbar radiographs 09/10/2016  FINDINGS: Segmentation:  Normal.  Lowest fully developed disc space L5-S1.  Alignment:  Lumbar dextroscoliosis.  Mild anterolisthesis L4-5  Vertebrae: Negative for fracture or mass lesion. Normal bone marrow.  Conus medullaris: Extends to the L1-2 level and appears normal.  Paraspinal and other soft tissues: Left renal cyst. No retroperitoneal adenopathy. Paraspinous muscles are symmetric.  Disc levels:  L1-2:  Normal disc space.  Mild facet degeneration  L2-3: Disc degeneration with diffuse bulging of the disc. Moderate facet and ligamentum flavum hypertrophy. Mild to moderate spinal stenosis and mild subarticular stenosis bilaterally.  L3-4: Disc degeneration with disc space narrowing and mild disc bulging. Mild facet hypertrophy. No significant canal or foraminal stenosis.  L4-5: Mild anterior slip. Moderate facet degeneration. Moderate disc degeneration. No significant stenosis.  L5-S1: Negative  IMPRESSION: Lumbar dextroscoliosis.   Negative for fracture  Mild to moderate spinal stenosis L2-3 secondary to disc and facet degeneration. Mild subarticular stenosis bilaterally.  Mild disc and facet degeneration L3-4  Grade 1 anterior slip L4-5. Disc and facet degeneration without stenosis.   Electronically Signed   By: Franchot Gallo M.D.   On: 09/22/2016 15:02     Objective:  VS:  HT:    WT:   BMI:     BP:(!) 155/95  HR:72bpm  TEMP: ( )  RESP:  Physical Exam  Ortho Exam Imaging: No results found.

## 2019-02-08 NOTE — Procedures (Signed)
Lumbosacral Transforaminal Epidural Steroid Injection - Sub-Pedicular Approach with Fluoroscopic Guidance  [name]      Date of Birth: [birth year] MRN: [id number] PCP: Lance Sell, NP      Visit Date: 01/10/2019   Universal Protocol:    Date/Time: 01/10/2019  Consent Given By: the patient  Position: PRONE  Additional Comments: Vital signs were monitored before and after the procedure. Patient was prepped and draped in the usual sterile fashion. The correct patient, procedure, and site was verified.   Injection Procedure Details:  Procedure Site One Meds Administered:  Meds ordered this encounter  Medications  . methylPREDNISolone acetate (DEPO-MEDROL) injection 80 mg    Laterality: Left  Location/Site:  L3-L4  Needle size: 22 G  Needle type: Spinal  Needle Placement: Transforaminal  Findings:    -Comments: Excellent flow of contrast along the nerve and into the epidural space.  Initially we attempted repeat of L2-3 interlaminar epidural steroid injection.  Do to spondylitic arthrosis and narrowing from a bony standpoint he is very hard to get good loss of resistance at that level.  Despite multiple attempts at repositioning and trying to get more of a flexed lower lumbar area we just could not get loss of resistance and converted the injection to a transforaminal approach.  If this does not help I would look at an L2 transforaminal injection.  Procedure Details: After squaring off the end-plates to get a true AP view, the C-arm was positioned so that an oblique view of the foramen as noted above was visualized. The target area is just inferior to the "nose of the scotty dog" or sub pedicular. The soft tissues overlying this structure were infiltrated with 2-3 ml. of 1% Lidocaine without Epinephrine.  The spinal needle was inserted toward the target using a "trajectory" view along the fluoroscope beam.  Under AP and lateral visualization, the needle  was advanced so it did not puncture dura and was located close the 6 O'Clock position of the pedical in AP tracterory. Biplanar projections were used to confirm position. Aspiration was confirmed to be negative for CSF and/or blood. A 1-2 ml. volume of Isovue-250 was injected and flow of contrast was noted at each level. Radiographs were obtained for documentation purposes.   After attaining the desired flow of contrast documented above, a 0.5 to 1.0 ml test dose of 0.25% Marcaine was injected into each respective transforaminal space.  The patient was observed for 90 seconds post injection.  After no sensory deficits were reported, and normal lower extremity motor function was noted,   the above injectate was administered so that equal amounts of the injectate were placed at each foramen (level) into the transforaminal epidural space.   Additional Comments:   Dressing: 2 x 2 sterile gauze and Band-Aid    Post-procedure details: Patient was observed during the procedure. Post-procedure instructions were reviewed.  Patient left the clinic in stable condition.

## 2019-02-14 ENCOUNTER — Other Ambulatory Visit: Payer: Self-pay

## 2019-02-14 ENCOUNTER — Ambulatory Visit: Payer: Self-pay

## 2019-02-14 ENCOUNTER — Ambulatory Visit (INDEPENDENT_AMBULATORY_CARE_PROVIDER_SITE_OTHER): Payer: Medicare Other | Admitting: Physical Medicine and Rehabilitation

## 2019-02-14 VITALS — BP 176/84

## 2019-02-14 DIAGNOSIS — M5416 Radiculopathy, lumbar region: Secondary | ICD-10-CM

## 2019-02-14 MED ORDER — DEXAMETHASONE SODIUM PHOSPHATE 10 MG/ML IJ SOLN
15.0000 mg | Freq: Once | INTRAMUSCULAR | Status: AC
  Administered 2019-02-14: 15 mg

## 2019-02-14 NOTE — Progress Notes (Signed)
 .  Numeric Pain Rating Scale and Functional Assessment Average Pain 8   In the last MONTH (on 0-10 scale) has pain interfered with the following?  1. General activity like being  able to carry out your everyday physical activities such as walking, climbing stairs, carrying groceries, or moving a chair?  Rating(8)   +Driver, -BT, -Dye Allergies.  

## 2019-03-09 ENCOUNTER — Ambulatory Visit (INDEPENDENT_AMBULATORY_CARE_PROVIDER_SITE_OTHER): Payer: Medicare Other | Admitting: Internal Medicine

## 2019-03-09 ENCOUNTER — Other Ambulatory Visit: Payer: Self-pay

## 2019-03-09 ENCOUNTER — Other Ambulatory Visit (INDEPENDENT_AMBULATORY_CARE_PROVIDER_SITE_OTHER): Payer: Medicare Other

## 2019-03-09 ENCOUNTER — Encounter: Payer: Self-pay | Admitting: Internal Medicine

## 2019-03-09 VITALS — BP 126/78 | HR 69 | Temp 98.4°F | Ht 69.5 in | Wt 184.0 lb

## 2019-03-09 DIAGNOSIS — Z0000 Encounter for general adult medical examination without abnormal findings: Secondary | ICD-10-CM

## 2019-03-09 DIAGNOSIS — R7309 Other abnormal glucose: Secondary | ICD-10-CM

## 2019-03-09 DIAGNOSIS — E538 Deficiency of other specified B group vitamins: Secondary | ICD-10-CM

## 2019-03-09 DIAGNOSIS — E611 Iron deficiency: Secondary | ICD-10-CM

## 2019-03-09 DIAGNOSIS — E559 Vitamin D deficiency, unspecified: Secondary | ICD-10-CM

## 2019-03-09 LAB — LIPID PANEL
Cholesterol: 147 mg/dL (ref 0–200)
HDL: 59 mg/dL (ref >39.00)
LDL Cholesterol: 66 mg/dL (ref 0–99)
NonHDL: 88.08
Total CHOL/HDL Ratio: 2
Triglycerides: 109 mg/dL (ref 0.0–149.0)
VLDL: 21.8 mg/dL (ref 0.0–40.0)

## 2019-03-09 LAB — BASIC METABOLIC PANEL
BUN: 37 mg/dL — ABNORMAL HIGH (ref 6–23)
CO2: 23 mEq/L (ref 19–32)
Calcium: 9 mg/dL (ref 8.4–10.5)
Chloride: 108 mEq/L (ref 96–112)
Creatinine, Ser: 2.37 mg/dL — ABNORMAL HIGH (ref 0.40–1.50)
GFR: 31.84 mL/min — ABNORMAL LOW (ref >60.00)
Glucose, Bld: 119 mg/dL — ABNORMAL HIGH (ref 70–99)
Potassium: 4.9 mEq/L (ref 3.5–5.1)
Sodium: 140 mEq/L (ref 135–145)

## 2019-03-09 LAB — HEMOGLOBIN A1C: Hgb A1c MFr Bld: 6.5 % (ref 4.6–6.5)

## 2019-03-09 LAB — MICROALBUMIN / CREATININE URINE RATIO
Creatinine,U: 100.6 mg/dL
Microalb Creat Ratio: 0.7 mg/g (ref 0.0–30.0)
Microalb, Ur: <0.7 mg/dL (ref 0.0–1.9)

## 2019-03-09 LAB — IBC PANEL
Iron: 67 ug/dL (ref 42–165)
Saturation Ratios: 20.4 % (ref 20.0–50.0)
Transferrin: 235 mg/dL (ref 212.0–360.0)

## 2019-03-09 LAB — TSH: TSH: 2.97 u[IU]/mL (ref 0.35–4.50)

## 2019-03-09 LAB — VITAMIN B12: Vitamin B-12: 971 pg/mL — ABNORMAL HIGH (ref 211–911)

## 2019-03-09 LAB — VITAMIN D 25 HYDROXY (VIT D DEFICIENCY, FRACTURES): VITD: 27.35 ng/mL — ABNORMAL LOW (ref 30.00–100.00)
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
## 2019-03-09 NOTE — Patient Instructions (Signed)

## 2019-03-10 ENCOUNTER — Other Ambulatory Visit: Payer: Self-pay | Admitting: Internal Medicine

## 2019-03-10 ENCOUNTER — Encounter: Payer: Self-pay | Admitting: Internal Medicine

## 2019-03-10 DIAGNOSIS — E559 Vitamin D deficiency, unspecified: Secondary | ICD-10-CM | POA: Insufficient documentation

## 2019-03-10 MED ORDER — VITAMIN D (ERGOCALCIFEROL) 1.25 MG (50000 UNIT) PO CAPS: 50000.0000 [IU] | ORAL_CAPSULE | ORAL | 0 refills | Status: DC

## 2019-03-10 NOTE — Assessment & Plan Note (Signed)
For f/u lab, may need oral replacement

## 2019-03-10 NOTE — Assessment & Plan Note (Signed)

## 2019-03-10 NOTE — Assessment & Plan Note (Signed)
stable overall by history and exam, recent data reviewed with pt, and pt to continue medical treatment as before,  to f/u any worsening symptoms or concerns  

## 2019-03-12 ENCOUNTER — Telehealth: Payer: Self-pay

## 2019-03-12 DIAGNOSIS — N183 Chronic kidney disease, stage 3 unspecified: Secondary | ICD-10-CM

## 2019-03-12 MED ORDER — VITAMIN D (ERGOCALCIFEROL) 1.25 MG (50000 UNIT) PO CAPS: 50000.0000 [IU] | ORAL_CAPSULE | ORAL | 0 refills | Status: DC

## 2019-03-12 NOTE — Telephone Encounter (Signed)
Pt has been informed.

## 2019-03-12 NOTE — Telephone Encounter (Signed)
-----   Message from Biagio Borg, MD sent at 03/10/2019 10:29 AM EDT ----- Left message on MyChart, pt to cont same tx except  The test results show that your current treatment is OK, as the tests are stable, except the Vitamin D level is low.  Please take Vitamin D 50000 units weekly for 12 weeks, then plan to change to OTC Vitamin D3 at 2000 units per day, indefinitely.Redmond Baseman to please inform pt, I will do rx

## 2019-03-12 NOTE — Addendum Note (Signed)
Addended by: Biagio Borg on: 03/12/2019 11:39 AM   Modules accepted: Orders

## 2019-03-12 NOTE — Telephone Encounter (Signed)
Yes, that is good idea as the kidney function is stable but not improved  I will refer

## 2019-03-12 NOTE — Telephone Encounter (Signed)
Pt has been informed of results and expressed understanding.  Pt would like to know does he need to see a kidney specialist like discussed during his visit. I do not see where a referral was put in.

## 2019-04-02 NOTE — Procedures (Signed)
Lumbosacral Transforaminal Epidural Steroid Injection - Sub-Pedicular Approach with Fluoroscopic Guidance  [name]      Date of Birth: 1933-05-27 MRN: [id number] PCP: Biagio Borg, MD      Visit Date: 02/14/2019   Universal Protocol:    Date/Time: 02/14/2019  Consent Given By: the patient  Position: PRONE  Additional Comments: Vital signs were monitored before and after the procedure. Patient was prepped and draped in the usual sterile fashion. The correct patient, procedure, and site was verified.   Injection Procedure Details:  Procedure Site One Meds Administered:  Meds ordered this encounter  Medications  . dexamethasone (DECADRON) injection 15 mg    Laterality: Left  Location/Site:  L2-L3  Needle size: 22 G  Needle type: Spinal  Needle Placement: Transforaminal  Findings:    -Comments: Excellent flow of contrast along the nerve and into the epidural space.  Procedure Details: After squaring off the end-plates to get a true AP view, the C-arm was positioned so that an oblique view of the foramen as noted above was visualized. The target area is just inferior to the "nose of the scotty dog" or sub pedicular. The soft tissues overlying this structure were infiltrated with 2-3 ml. of 1% Lidocaine without Epinephrine.  The spinal needle was inserted toward the target using a "trajectory" view along the fluoroscope beam.  Under AP and lateral visualization, the needle was advanced so it did not puncture dura and was located close the 6 O'Clock position of the pedical in AP tracterory. Biplanar projections were used to confirm position. Aspiration was confirmed to be negative for CSF and/or blood. A 1-2 ml. volume of Isovue-250 was injected and flow of contrast was noted at each level. Radiographs were obtained for documentation purposes.   After attaining the desired flow of contrast documented above, a 0.5 to 1.0 ml test dose of 0.25% Marcaine was  injected into each respective transforaminal space.  The patient was observed for 90 seconds post injection.  After no sensory deficits were reported, and normal lower extremity motor function was noted,   the above injectate was administered so that equal amounts of the injectate were placed at each foramen (level) into the transforaminal epidural space.   Additional Comments:  The patient tolerated the procedure well Dressing: 2 x 2 sterile gauze and Band-Aid    Post-procedure details: Patient was observed during the procedure. Post-procedure instructions were reviewed.  Patient left the clinic in stable condition.

## 2019-04-02 NOTE — Progress Notes (Signed)
Greg [name] - 85 y.o. male MRN [id number]  Date of birth: 08-30-1933  Office Visit Note: Visit Date: 02/14/2019 PCP: Biagio Borg, MD Referred by: Lance Sell, NP  Subjective: Chief Complaint  Patient presents with  . Lower Back - Pain  . Left Leg - Pain   HPI:  Greg [name] is a 85 y.o. male who comes in today For planned left L2 transforaminal epidural steroid injection.  Patient had an injection about a month ago which was a left L3 transforaminal injection that did help but did not last long.  Unfortunately he has been really hit or miss on these injections.  We did have a couple of injections early on during his course which were very beneficial for several months.  He does have some stenosis at L2-3 which is not very severe.  He does have a lot of facet arthropathy and degenerative changes.  Interlaminar injections have really become to the point where we just cannot really doing very well at that level.  Is a combination of positioning and arthritic change but is very hard to get an interlaminar loss-of-resistance there.  Transforaminal approach did seem to help but it was at L3.  He has somewhat of a transitional appearance with L5 appearing transitional.  Based on all that we are going to complete a left L2 transforaminal injection hopefully this will help him a lot longer.  ROS Otherwise per HPI.  Assessment & Plan: Visit Diagnoses:  1. Lumbar radiculopathy     Plan: No additional findings.   Meds & Orders:  Meds ordered this encounter  Medications  . dexamethasone (DECADRON) injection 15 mg    Orders Placed This Encounter  Procedures  . XR C-ARM NO REPORT  . Epidural Steroid injection    Follow-up: No follow-ups on file.   Procedures: No procedures performed  Lumbosacral Transforaminal Epidural Steroid Injection - Sub-Pedicular Approach with Fluoroscopic Guidance  [name]      Date of Birth: [birth year] MRN: [id number] PCP: Biagio Borg, MD      Visit Date: 02/14/2019   Universal Protocol:    Date/Time: 02/14/2019  Consent Given By: the patient  Position: PRONE  Additional Comments: Vital signs were monitored before and after the procedure. Patient was prepped and draped in the usual sterile fashion. The correct patient, procedure, and site was verified.   Injection Procedure Details:  Procedure Site One Meds Administered:  Meds ordered this encounter  Medications  . dexamethasone (DECADRON) injection 15 mg    Laterality: Left  Location/Site:  L2-L3  Needle size: 22 G  Needle type: Spinal  Needle Placement: Transforaminal  Findings:    -Comments: Excellent flow of contrast along the nerve and into the epidural space.  Procedure Details: After squaring off the end-plates to get a true AP view, the C-arm was positioned so that an oblique view of the foramen as noted above was visualized. The target area is just inferior to the "nose of the scotty dog" or sub pedicular. The soft tissues overlying this structure were infiltrated with 2-3 ml. of 1% Lidocaine without Epinephrine.  The spinal needle was inserted toward the target using a "trajectory" view along the fluoroscope beam.  Under AP and lateral visualization, the needle was advanced so it did not puncture dura and was located close the 6 O'Clock position of the pedical in AP tracterory. Biplanar projections were used to confirm position. Aspiration was confirmed to be negative for CSF  and/or blood. A 1-2 ml. volume of Isovue-250 was injected and flow of contrast was noted at each level. Radiographs were obtained for documentation purposes.   After attaining the desired flow of contrast documented above, a 0.5 to 1.0 ml test dose of 0.25% Marcaine was injected into each respective transforaminal space.  The patient was observed for 90 seconds post injection.  After no sensory deficits were reported, and normal lower extremity motor function was  noted,   the above injectate was administered so that equal amounts of the injectate were placed at each foramen (level) into the transforaminal epidural space.   Additional Comments:  The patient tolerated the procedure well Dressing: 2 x 2 sterile gauze and Band-Aid    Post-procedure details: Patient was observed during the procedure. Post-procedure instructions were reviewed.  Patient left the clinic in stable condition.    Clinical History: MRI LUMBAR SPINE WITHOUT CONTRAST  TECHNIQUE: Multiplanar, multisequence MR imaging of the lumbar spine was performed. No intravenous contrast was administered.  COMPARISON:  Lumbar radiographs 09/10/2016  FINDINGS: Segmentation:  Normal.  Lowest fully developed disc space L5-S1.  Alignment:  Lumbar dextroscoliosis.  Mild anterolisthesis L4-5  Vertebrae: Negative for fracture or mass lesion. Normal bone marrow.  Conus medullaris: Extends to the L1-2 level and appears normal.  Paraspinal and other soft tissues: Left renal cyst. No retroperitoneal adenopathy. Paraspinous muscles are symmetric.  Disc levels:  L1-2:  Normal disc space.  Mild facet degeneration  L2-3: Disc degeneration with diffuse bulging of the disc. Moderate facet and ligamentum flavum hypertrophy. Mild to moderate spinal stenosis and mild subarticular stenosis bilaterally.  L3-4: Disc degeneration with disc space narrowing and mild disc bulging. Mild facet hypertrophy. No significant canal or foraminal stenosis.  L4-5: Mild anterior slip. Moderate facet degeneration. Moderate disc degeneration. No significant stenosis.  L5-S1: Negative  IMPRESSION: Lumbar dextroscoliosis.  Negative for fracture  Mild to moderate spinal stenosis L2-3 secondary to disc and facet degeneration. Mild subarticular stenosis bilaterally.  Mild disc and facet degeneration L3-4  Grade 1 anterior slip L4-5. Disc and facet degeneration without stenosis.    Electronically Signed   By: Franchot Gallo M.D.   On: 09/22/2016 15:02     Objective:  VS:  HT:    WT:   BMI:     BP:(!) 176/84  HR: bpm  TEMP: ( )  RESP:  Physical Exam  Ortho Exam Imaging: No results found.

## 2019-04-09 ENCOUNTER — Ambulatory Visit: Payer: Medicare Other | Admitting: Nurse Practitioner

## 2019-04-11 ENCOUNTER — Telehealth: Payer: Self-pay | Admitting: Physical Medicine and Rehabilitation

## 2019-04-12 NOTE — Telephone Encounter (Signed)
Scheduled for 8/25 at 0915 with Dr. Lorin Mercy.

## 2019-04-12 NOTE — Telephone Encounter (Signed)
I really think follow up with Dr. Lorin Mercy, injections not lasting very long, ? MRI

## 2019-04-18 ENCOUNTER — Other Ambulatory Visit: Payer: Self-pay | Admitting: Family

## 2019-04-18 ENCOUNTER — Other Ambulatory Visit: Payer: Self-pay | Admitting: Internal Medicine

## 2019-04-18 DIAGNOSIS — N183 Chronic kidney disease, stage 3 (moderate): Secondary | ICD-10-CM | POA: Diagnosis not present

## 2019-04-18 DIAGNOSIS — D631 Anemia in chronic kidney disease: Secondary | ICD-10-CM | POA: Diagnosis not present

## 2019-04-18 DIAGNOSIS — D472 Monoclonal gammopathy: Secondary | ICD-10-CM | POA: Diagnosis not present

## 2019-04-18 DIAGNOSIS — I129 Hypertensive chronic kidney disease with stage 1 through stage 4 chronic kidney disease, or unspecified chronic kidney disease: Secondary | ICD-10-CM | POA: Diagnosis not present

## 2019-04-24 ENCOUNTER — Ambulatory Visit: Payer: Self-pay

## 2019-04-24 ENCOUNTER — Encounter: Payer: Self-pay | Admitting: Orthopaedic Surgery

## 2019-04-24 ENCOUNTER — Ambulatory Visit (INDEPENDENT_AMBULATORY_CARE_PROVIDER_SITE_OTHER): Payer: Medicare Other | Admitting: Orthopaedic Surgery

## 2019-04-24 VITALS — BP 130/75 | HR 73 | Ht 69.0 in | Wt 182.0 lb

## 2019-04-24 DIAGNOSIS — M48061 Spinal stenosis, lumbar region without neurogenic claudication: Secondary | ICD-10-CM | POA: Diagnosis not present

## 2019-04-24 DIAGNOSIS — M25552 Pain in left hip: Secondary | ICD-10-CM

## 2019-04-24 DIAGNOSIS — M25551 Pain in right hip: Secondary | ICD-10-CM

## 2019-04-24 DIAGNOSIS — M4807 Spinal stenosis, lumbosacral region: Secondary | ICD-10-CM

## 2019-04-24 MED ORDER — DIAZEPAM 5 MG PO TABS: ORAL_TABLET | ORAL | 0 refills | Status: DC
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
## 2019-04-26 ENCOUNTER — Other Ambulatory Visit: Payer: Self-pay | Admitting: Internal Medicine

## 2019-04-30 ENCOUNTER — Other Ambulatory Visit: Payer: Self-pay | Admitting: Internal Medicine

## 2019-05-08 ENCOUNTER — Other Ambulatory Visit: Payer: Self-pay

## 2019-05-08 ENCOUNTER — Encounter (INDEPENDENT_AMBULATORY_CARE_PROVIDER_SITE_OTHER): Payer: Medicare Other | Admitting: Ophthalmology

## 2019-05-08 DIAGNOSIS — H353121 Nonexudative age-related macular degeneration, left eye, early dry stage: Secondary | ICD-10-CM

## 2019-05-08 DIAGNOSIS — H35033 Hypertensive retinopathy, bilateral: Secondary | ICD-10-CM

## 2019-05-08 DIAGNOSIS — H43812 Vitreous degeneration, left eye: Secondary | ICD-10-CM

## 2019-05-08 DIAGNOSIS — I10 Essential (primary) hypertension: Secondary | ICD-10-CM | POA: Diagnosis not present

## 2019-05-08 DIAGNOSIS — D3132 Benign neoplasm of left choroid: Secondary | ICD-10-CM | POA: Diagnosis not present

## 2019-05-08 DIAGNOSIS — H2512 Age-related nuclear cataract, left eye: Secondary | ICD-10-CM

## 2019-05-09 ENCOUNTER — Telehealth: Payer: Self-pay | Admitting: Orthopaedic Surgery

## 2019-05-09 NOTE — Telephone Encounter (Signed)
Greg [name],  Patient called to state that his MRI has been scheduled and was supposed to have 3 pills called in prior to this MRI and nothing has been called 05/20/19.  Please call patient to advise [phone]

## 2019-05-09 NOTE — Telephone Encounter (Signed)
Medication has been called to pharmacy. I left voicemail for patient advising and asked for return call if he has any problems.

## 2019-05-14 ENCOUNTER — Telehealth: Payer: Self-pay | Admitting: Orthopaedic Surgery

## 2019-05-14 ENCOUNTER — Ambulatory Visit (INDEPENDENT_AMBULATORY_CARE_PROVIDER_SITE_OTHER): Payer: Medicare Other

## 2019-05-14 ENCOUNTER — Other Ambulatory Visit: Payer: Self-pay

## 2019-05-14 DIAGNOSIS — Z23 Encounter for immunization: Secondary | ICD-10-CM | POA: Diagnosis not present

## 2019-05-14 NOTE — Telephone Encounter (Signed)
Called patient left message to return call to schedule an appointment for MRI review with Jeneen Rinks   Note: Called  Cell number got recording mailbox have not been set up yet   Could not leave message [phone]

## 2019-05-15 ENCOUNTER — Telehealth: Payer: Self-pay | Admitting: Orthopaedic Surgery

## 2019-05-15 NOTE — Telephone Encounter (Signed)
Patient called and wanted to know how to take medication before MRI.  Please call patient [phone]

## 2019-05-16 NOTE — Telephone Encounter (Signed)
I left voicemail for patient advising. 

## 2019-05-20 ENCOUNTER — Other Ambulatory Visit: Payer: Self-pay

## 2019-05-20 ENCOUNTER — Ambulatory Visit
Admission: RE | Admit: 2019-05-20 | Discharge: 2019-05-20 | Disposition: A | Discharge status: Home / Self Care | Payer: Medicare Other | Source: Ambulatory Visit | Attending: Orthopaedic Surgery | Admitting: Orthopaedic Surgery

## 2019-05-20 DIAGNOSIS — M4807 Spinal stenosis, lumbosacral region: Secondary | ICD-10-CM

## 2019-05-20 DIAGNOSIS — M48061 Spinal stenosis, lumbar region without neurogenic claudication: Secondary | ICD-10-CM | POA: Diagnosis not present

## 2019-05-20 DIAGNOSIS — M545 Low back pain: Secondary | ICD-10-CM | POA: Diagnosis not present

## 2019-05-23 ENCOUNTER — Other Ambulatory Visit: Payer: Self-pay | Admitting: Internal Medicine

## 2019-05-24 ENCOUNTER — Encounter: Payer: Self-pay | Admitting: Surgery

## 2019-05-24 ENCOUNTER — Ambulatory Visit (INDEPENDENT_AMBULATORY_CARE_PROVIDER_SITE_OTHER): Payer: Medicare Other | Admitting: Surgery

## 2019-05-24 VITALS — Ht 69.0 in | Wt 182.0 lb

## 2019-05-24 DIAGNOSIS — M48061 Spinal stenosis, lumbar region without neurogenic claudication: Secondary | ICD-10-CM

## 2019-05-24 NOTE — Progress Notes (Signed)
85 year old black male returns for review of lumbar MRI scan performed May 20, 2019.  Scan showed:  EXAM: MRI LUMBAR SPINE WITHOUT CONTRAST  TECHNIQUE: Multiplanar, multisequence MR imaging of the lumbar spine was performed. No intravenous contrast was administered.  COMPARISON:  09/22/2016  FINDINGS: Segmentation:  Standard.  Alignment: Minimal grade 1 anterolisthesis of L4 on L5 secondary to facet disease.  Vertebrae:  No fracture, evidence of discitis, or bone lesion.  Conus medullaris and cauda equina: Conus extends to the L1 level. Conus and cauda equina appear normal.  Paraspinal and other soft tissues: No acute paraspinal abnormality.  Disc levels:  Disc spaces: Degenerative disease with disc height loss at L2-3, L3-4 and L4-5.  T11-12: Mild broad-based disc bulge. Mild bilateral facet arthropathy. No foraminal stenosis.  T12-L1: No significant disc bulge. No evidence of neural foraminal stenosis. No central canal stenosis.  L1-L2: No significant disc bulge. No evidence of neural foraminal stenosis. No central canal stenosis.  L2-L3: Broad-based disc bulge. Moderate bilateral facet arthropathy. Mild left foraminal stenosis. No right foraminal stenosis. Moderate spinal stenosis.  L3-L4: Broad-based disc bulge. Mild bilateral facet arthropathy. Mild right foraminal narrowing. No left foraminal narrowing. No central canal stenosis.  L4-L5: Broad-based disc osteophyte complex. Moderate bilateral facet arthropathy. No evidence of neural foraminal stenosis. No central canal stenosis.  L5-S1: No significant disc bulge. No evidence of neural foraminal stenosis. No central canal stenosis.  IMPRESSION: 1. At L2-3 there is a broad-based disc bulge. Moderate bilateral facet arthropathy. Mild left foraminal stenosis. No right foraminal stenosis. Moderate spinal stenosis. 2. At L3-4 there is a broad-based disc bulge. Mild bilateral  facet arthropathy. Mild right foraminal narrowing. No left foraminal narrowing. 3. At L4-5 there is a broad-based disc osteophyte complex. Moderate bilateral facet arthropathy.   Patient seen used to describe having pain in the central low back that extends to the bilateral buttocks.  Still also describing some neurogenic claudication symptoms.    Exam Very pleasant elderly black male alert and oriented in no acute distress.  Patient has some tenderness around the bilateral SI joints.  Also mild bilateral lumbar paraspinal tenderness.  Negative logroll bilateral hips.  Negative straight leg raise.  Neurovascular intact.   Plan We will schedule patient for bilateral L2-3 facet injections with Dr. Ernestina Patches.  I advised patient to pay close attention to how he feels immediately after the injections.  May also consider trying bilateral L4-5 facet injections depending upon his response.  Follow with me 2 weeks after he has had the injection.  Dr. Lorin Mercy did review the lumbar MRI in the [facility] clinic today with me over the phone.

## 2019-05-24 NOTE — Telephone Encounter (Signed)
John pt 

## 2019-06-01 ENCOUNTER — Other Ambulatory Visit: Payer: Medicare Other

## 2019-06-14 ENCOUNTER — Other Ambulatory Visit: Payer: Self-pay

## 2019-06-14 ENCOUNTER — Ambulatory Visit: Payer: Self-pay

## 2019-06-14 ENCOUNTER — Ambulatory Visit (INDEPENDENT_AMBULATORY_CARE_PROVIDER_SITE_OTHER): Payer: Medicare Other | Admitting: Physical Medicine and Rehabilitation

## 2019-06-14 ENCOUNTER — Encounter: Payer: Self-pay | Admitting: Physical Medicine and Rehabilitation

## 2019-06-14 VITALS — BP 143/97 | HR 70

## 2019-06-14 DIAGNOSIS — M47816 Spondylosis without myelopathy or radiculopathy, lumbar region: Secondary | ICD-10-CM

## 2019-06-14 MED ORDER — METHYLPREDNISOLONE ACETATE 80 MG/ML IJ SUSP
80.0000 mg | Freq: Once | INTRAMUSCULAR | Status: AC
  Administered 2019-06-14: 80 mg

## 2019-06-14 NOTE — Progress Notes (Signed)
 .  Numeric Pain Rating Scale and Functional Assessment Average Pain 8   In the last MONTH (on 0-10 scale) has pain interfered with the following?  1. General activity like being  able to carry out your everyday physical activities such as walking, climbing stairs, carrying groceries, or moving a chair?  Rating(8)   +Driver, -BT, -Dye Allergies.  
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
## 2019-06-15 NOTE — Procedures (Signed)
Lumbar Facet Joint Intra-Articular Injection(s) with Fluoroscopic Guidance  [name]      Date of Birth: Oct 21, 1932 MRN: [id number] PCP: Biagio Borg, MD      Visit Date: 06/14/2019   Universal Protocol:    Date/Time: 06/14/2019  Consent Given By: the patient  Position: PRONE   Additional Comments: Vital signs were monitored before and after the procedure. Patient was prepped and draped in the usual sterile fashion. The correct patient, procedure, and site was verified.   Injection Procedure Details:  Procedure Site One Meds Administered:  Meds ordered this encounter  Medications  . methylPREDNISolone acetate (DEPO-MEDROL) injection 80 mg     Laterality: Bilateral  Location/Site:  L2-L3  Needle size: 22 guage  Needle type: Spinal  Needle Placement: Articular  Findings:  -Comments: Excellent flow of contrast producing a partial arthrogram.  Procedure Details: The fluoroscope beam is vertically oriented in AP, and the inferior recess is visualized beneath the lower pole of the inferior apophyseal process, which represents the target point for needle insertion. When direct visualization is difficult the target point is located at the medial projection of the vertebral pedicle. The region overlying each aforementioned target is locally anesthetized with a 1 to 2 ml. volume of 1% Lidocaine without Epinephrine.   The spinal needle was inserted into each of the above mentioned facet joints using biplanar fluoroscopic guidance. A 0.25 to 0.5 ml. volume of Isovue-250 was injected and a partial facet joint arthrogram was obtained. A single spot film was obtained of the resulting arthrogram.    One to 1.25 ml of the steroid/anesthetic solution was then injected into each of the facet joints noted above.   Additional Comments:  The patient tolerated the procedure well Dressing: 2 x 2 sterile gauze and Band-Aid    Post-procedure details: Patient was observed  during the procedure. Post-procedure instructions were reviewed.  Patient left the clinic in stable condition.

## 2019-06-26 ENCOUNTER — Inpatient Hospital Stay: Payer: Medicare Other | Admitting: Hematology

## 2019-06-26 ENCOUNTER — Telehealth: Payer: Self-pay | Admitting: Hematology

## 2019-06-26 ENCOUNTER — Inpatient Hospital Stay: Payer: Medicare Other | Attending: Hematology

## 2019-06-26 NOTE — Telephone Encounter (Signed)
Pt missed appt today - called pt and was not able to reach them . Left message for patient to call back and reschedule missed appt .

## 2019-06-28 ENCOUNTER — Encounter: Payer: Self-pay | Admitting: Surgery

## 2019-06-28 ENCOUNTER — Ambulatory Visit (INDEPENDENT_AMBULATORY_CARE_PROVIDER_SITE_OTHER): Payer: Medicare Other | Admitting: Surgery

## 2019-06-28 ENCOUNTER — Other Ambulatory Visit: Payer: Self-pay

## 2019-06-28 VITALS — BP 158/94 | HR 73 | Ht 69.0 in | Wt 185.0 lb

## 2019-06-28 DIAGNOSIS — M4726 Other spondylosis with radiculopathy, lumbar region: Secondary | ICD-10-CM

## 2019-06-28 NOTE — Progress Notes (Signed)
85 year old black male returns for recheck after having lumbar facet injections.  States that it has helped his back pain but he continues to have pain down both legs.  He is not interested in surgery. Advised patient that I recommend scheduling a consult with Dr. Ernestina Patches to discuss appropriate lumbar ESI's.  Patient will follow-up with Dr. Lorin Mercy a couple weeks after further injections with Dr. Ernestina Patches if he elects to do so.  Again patient states that he is not interested in any surgery for his back so hopefully we can get him some relief with injections.

## 2019-07-18 ENCOUNTER — Ambulatory Visit (INDEPENDENT_AMBULATORY_CARE_PROVIDER_SITE_OTHER): Payer: Medicare Other | Admitting: Physical Medicine and Rehabilitation

## 2019-07-18 ENCOUNTER — Encounter: Payer: Self-pay | Admitting: Physical Medicine and Rehabilitation

## 2019-07-18 ENCOUNTER — Ambulatory Visit: Payer: Medicare Other

## 2019-07-18 ENCOUNTER — Other Ambulatory Visit: Payer: Self-pay

## 2019-07-18 VITALS — BP 144/87 | HR 75

## 2019-07-18 DIAGNOSIS — M48062 Spinal stenosis, lumbar region with neurogenic claudication: Secondary | ICD-10-CM | POA: Diagnosis not present

## 2019-07-18 MED ORDER — BETAMETHASONE SOD PHOS & ACET 6 (3-3) MG/ML IJ SUSP
12.0000 mg | Freq: Once | INTRAMUSCULAR | Status: AC
  Administered 2019-07-18: 12 mg

## 2019-07-18 NOTE — Progress Notes (Signed)
 .  Numeric Pain Rating Scale and Functional Assessment Average Pain 10 Pain Right Now 4 My pain is constant, dull and aching Pain is worse with: walking Pain improves with: heat/ice   In the last MONTH (on 0-10 scale) has pain interfered with the following?  1. General activity like being  able to carry out your everyday physical activities such as walking, climbing stairs, carrying groceries, or moving a chair?  Rating(8)  2. Relation with others like being able to carry out your usual social activities and roles such as  activities at home, at work and in your community. Rating(8)  3. Enjoyment of life such that you have  been bothered by emotional problems such as feeling anxious, depressed or irritable?  Rating(4)

## 2019-08-03 ENCOUNTER — Telehealth: Payer: Self-pay | Admitting: Internal Medicine

## 2019-08-03 MED ORDER — FAMOTIDINE 20 MG PO TABS: 20.0000 mg | ORAL_TABLET | Freq: Two times a day (BID) | ORAL | 1 refills | Status: DC

## 2019-08-03 NOTE — Telephone Encounter (Signed)
Pt states that he needs his indigestion pills but isn't sure what the name is.  States that he did get it through [facility] but has misplaced the bottle and needs more. States that pharmacy told him to call and see if samples were available through the office.

## 2019-08-15 ENCOUNTER — Telehealth: Payer: Self-pay | Admitting: Physical Medicine and Rehabilitation

## 2019-08-15 NOTE — Telephone Encounter (Signed)
Great, need to remember what we did

## 2019-08-29 ENCOUNTER — Other Ambulatory Visit: Payer: Self-pay | Admitting: Emergency Medicine

## 2019-08-29 MED ORDER — ACCU-CHEK GUIDE VI STRP: 1.0000 | ORAL_STRIP | Freq: Every day | 1 refills | Status: DC | PRN

## 2019-08-29 MED ORDER — ACCU-CHEK SOFT TOUCH LANCETS MISC: 1.0000 | Freq: Every day | 1 refills | Status: DC | PRN

## 2019-08-29 NOTE — Telephone Encounter (Signed)
Sent rx's to [facility].Marland KitchenJohny [name]

## 2019-08-29 NOTE — Telephone Encounter (Signed)
Pt is requesting a refill on his Blood Glucose Monitoring Suppl (ACCU-CHEK GUIDE) w/Device KIT. Pharmacy is [facility]. He also wanted me to give you this number [phone] to call the prescription into. Thanks

## 2019-08-30 NOTE — Telephone Encounter (Signed)
Pt called and is requesting to have his test strips and his needles sent to his home instead of the pharmacy. Please advise.

## 2019-08-30 NOTE — Addendum Note (Signed)
Addended by: Jefferson Fuel on: 08/30/2019 01:17 PM   Modules accepted: Orders

## 2019-09-03 MED ORDER — ACCU-CHEK SOFT TOUCH LANCETS MISC: 1.0000 | Freq: Every day | 3 refills | Status: DC | PRN

## 2019-09-03 MED ORDER — ACCU-CHEK GUIDE W/DEVICE KIT: 1.0000 | PACK | 0 refills | Status: DC

## 2019-09-03 NOTE — Telephone Encounter (Signed)
Reviewed chart pt is up-to-date [facility]Marland KitchenJohny Chess

## 2019-09-03 NOTE — Addendum Note (Signed)
Addended by: Earnstine Regal on: 09/03/2019 09:51 AM   Modules accepted: Orders

## 2019-09-15 ENCOUNTER — Other Ambulatory Visit: Payer: Self-pay | Admitting: Internal Medicine

## 2019-09-15 NOTE — Telephone Encounter (Signed)
Please refill as per office routine med refill policy (all routine meds refilled for 3 mo or monthly per pt preference up to one year from last visit, then month to month grace period for 3 mo, then further med refills will have to be denied)  

## 2019-09-19 ENCOUNTER — Other Ambulatory Visit: Payer: Self-pay | Admitting: Internal Medicine

## 2019-09-19 NOTE — Telephone Encounter (Signed)
Please refill as per office routine med refill policy (all routine meds refilled for 3 mo or monthly per pt preference up to one year from last visit, then month to month grace period for 3 mo, then further med refills will have to be denied)  

## 2019-09-21 ENCOUNTER — Encounter: Payer: Self-pay | Admitting: Internal Medicine

## 2019-09-21 ENCOUNTER — Other Ambulatory Visit: Payer: Self-pay

## 2019-09-21 ENCOUNTER — Ambulatory Visit (INDEPENDENT_AMBULATORY_CARE_PROVIDER_SITE_OTHER): Payer: Medicare Other | Admitting: Internal Medicine

## 2019-09-21 VITALS — BP 140/86 | HR 69 | Temp 98.3°F | Ht 69.0 in | Wt 193.0 lb

## 2019-09-21 DIAGNOSIS — Z0000 Encounter for general adult medical examination without abnormal findings: Secondary | ICD-10-CM | POA: Diagnosis not present

## 2019-09-21 DIAGNOSIS — E559 Vitamin D deficiency, unspecified: Secondary | ICD-10-CM | POA: Diagnosis not present

## 2019-09-21 DIAGNOSIS — N183 Chronic kidney disease, stage 3 unspecified: Secondary | ICD-10-CM | POA: Diagnosis not present

## 2019-09-21 DIAGNOSIS — R7309 Other abnormal glucose: Secondary | ICD-10-CM | POA: Diagnosis not present

## 2019-09-21 LAB — HEPATIC FUNCTION PANEL
ALT: 17 U/L (ref 0–53)
AST: 19 U/L (ref 0–37)
Albumin: 4.1 g/dL (ref 3.5–5.2)
Alkaline Phosphatase: 48 U/L (ref 39–117)
Bilirubin, Direct: 0.1 mg/dL (ref 0.0–0.3)
Total Bilirubin: 0.4 mg/dL (ref 0.2–1.2)
Total Protein: 6.4 g/dL (ref 6.0–8.3)

## 2019-09-21 LAB — TSH: TSH: 2.8 u[IU]/mL (ref 0.35–4.50)

## 2019-09-21 LAB — CBC WITH DIFFERENTIAL/PLATELET
Basophils Absolute: 0 10*3/uL (ref 0.0–0.1)
Basophils Relative: 0.7 % (ref 0.0–3.0)
Eosinophils Absolute: 0.1 10*3/uL (ref 0.0–0.7)
Eosinophils Relative: 2.3 % (ref 0.0–5.0)
HCT: 31 % — ABNORMAL LOW (ref 39.0–52.0)
Hemoglobin: 10.2 g/dL — ABNORMAL LOW (ref 13.0–17.0)
Lymphocytes Relative: 29.2 % (ref 12.0–46.0)
Lymphs Abs: 1.1 10*3/uL (ref 0.7–4.0)
MCHC: 32.9 g/dL (ref 30.0–36.0)
MCV: 97.8 fl (ref 78.0–100.0)
Monocytes Absolute: 0.4 10*3/uL (ref 0.1–1.0)
Monocytes Relative: 11.3 % (ref 3.0–12.0)
Neutro Abs: 2.1 10*3/uL (ref 1.4–7.7)
Neutrophils Relative %: 56.5 % (ref 43.0–77.0)
Platelets: 168 10*3/uL (ref 150.0–400.0)
RBC: 3.17 Mil/uL — ABNORMAL LOW (ref 4.22–5.81)
RDW: 14 % (ref 11.5–15.5)
WBC: 3.8 10*3/uL — ABNORMAL LOW (ref 4.0–10.5)

## 2019-09-21 LAB — LIPID PANEL
Cholesterol: 142 mg/dL (ref 0–200)
HDL: 58.2 mg/dL (ref >39.00)
LDL Cholesterol: 69 mg/dL (ref 0–99)
NonHDL: 84.16
Total CHOL/HDL Ratio: 2
Triglycerides: 75 mg/dL (ref 0.0–149.0)
VLDL: 15 mg/dL (ref 0.0–40.0)

## 2019-09-21 LAB — BASIC METABOLIC PANEL
BUN: 26 mg/dL — ABNORMAL HIGH (ref 6–23)
CO2: 24 mEq/L (ref 19–32)
Calcium: 9.4 mg/dL (ref 8.4–10.5)
Chloride: 108 mEq/L (ref 96–112)
Creatinine, Ser: 2.04 mg/dL — ABNORMAL HIGH (ref 0.40–1.50)
GFR: 37.8 mL/min — ABNORMAL LOW (ref >60.00)
Glucose, Bld: 132 mg/dL — ABNORMAL HIGH (ref 70–99)
Potassium: 5.2 mEq/L — ABNORMAL HIGH (ref 3.5–5.1)
Sodium: 138 mEq/L (ref 135–145)

## 2019-09-21 LAB — URINALYSIS, ROUTINE W REFLEX MICROSCOPIC
Bilirubin Urine: NEGATIVE
Hgb urine dipstick: NEGATIVE
Ketones, ur: NEGATIVE
Leukocytes,Ua: NEGATIVE
Nitrite: NEGATIVE
RBC / HPF: NONE SEEN (ref 0–?)
Specific Gravity, Urine: 1.015 (ref 1.000–1.030)
Total Protein, Urine: NEGATIVE
Urine Glucose: NEGATIVE
Urobilinogen, UA: 0.2 (ref 0.0–1.0)
WBC, UA: NONE SEEN (ref 0–?)
pH: 6 (ref 5.0–8.0)

## 2019-09-21 LAB — VITAMIN D 25 HYDROXY (VIT D DEFICIENCY, FRACTURES): VITD: 32.55 ng/mL (ref 30.00–100.00)

## 2019-09-21 LAB — HEMOGLOBIN A1C: Hgb A1c MFr Bld: 6.4 % (ref 4.6–6.5)

## 2019-09-21 LAB — MICROALBUMIN / CREATININE URINE RATIO
Creatinine,U: 80.5 mg/dL
Microalb Creat Ratio: 0.9 mg/g (ref 0.0–30.0)
Microalb, Ur: <0.7 mg/dL (ref 0.0–1.9)
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
## 2019-09-21 NOTE — Patient Instructions (Addendum)

## 2019-09-22 ENCOUNTER — Encounter: Payer: Self-pay | Admitting: Internal Medicine

## 2019-09-22 NOTE — Assessment & Plan Note (Signed)
Cont oral replacement 

## 2019-09-22 NOTE — Assessment & Plan Note (Signed)

## 2019-09-22 NOTE — Assessment & Plan Note (Signed)
stable overall by history and exam, recent data reviewed with pt, and pt to continue medical treatment as before,  to f/u any worsening symptoms or concerns  

## 2019-09-23 ENCOUNTER — Encounter: Payer: Self-pay | Admitting: Internal Medicine

## 2019-10-24 ENCOUNTER — Other Ambulatory Visit: Payer: Self-pay | Admitting: Internal Medicine

## 2019-11-01 ENCOUNTER — Ambulatory Visit (INDEPENDENT_AMBULATORY_CARE_PROVIDER_SITE_OTHER): Payer: Medicare Other

## 2019-11-01 ENCOUNTER — Encounter: Payer: Self-pay | Admitting: Internal Medicine

## 2019-11-01 ENCOUNTER — Other Ambulatory Visit: Payer: Self-pay

## 2019-11-01 ENCOUNTER — Ambulatory Visit (INDEPENDENT_AMBULATORY_CARE_PROVIDER_SITE_OTHER): Payer: Medicare Other | Admitting: Internal Medicine

## 2019-11-01 VITALS — BP 142/80 | HR 74 | Temp 98.1°F | Ht 69.0 in | Wt 190.0 lb

## 2019-11-01 DIAGNOSIS — R7309 Other abnormal glucose: Secondary | ICD-10-CM

## 2019-11-01 DIAGNOSIS — R0609 Other forms of dyspnea: Secondary | ICD-10-CM | POA: Insufficient documentation

## 2019-11-01 DIAGNOSIS — K2100 Gastro-esophageal reflux disease with esophagitis, without bleeding: Secondary | ICD-10-CM

## 2019-11-01 DIAGNOSIS — I10 Essential (primary) hypertension: Secondary | ICD-10-CM | POA: Diagnosis not present

## 2019-11-01 DIAGNOSIS — R0600 Dyspnea, unspecified: Secondary | ICD-10-CM

## 2019-11-01 DIAGNOSIS — S21109A Unspecified open wound of unspecified front wall of thorax without penetration into thoracic cavity, initial encounter: Secondary | ICD-10-CM | POA: Diagnosis not present

## 2019-11-01 DIAGNOSIS — N183 Chronic kidney disease, stage 3 unspecified: Secondary | ICD-10-CM | POA: Diagnosis not present

## 2019-11-01 LAB — CBC WITH DIFFERENTIAL/PLATELET
Basophils Absolute: 0 10*3/uL (ref 0.0–0.1)
Basophils Relative: 0.6 % (ref 0.0–3.0)
Eosinophils Absolute: 0.1 10*3/uL (ref 0.0–0.7)
Eosinophils Relative: 3.2 % (ref 0.0–5.0)
HCT: 30 % — ABNORMAL LOW (ref 39.0–52.0)
Hemoglobin: 10.2 g/dL — ABNORMAL LOW (ref 13.0–17.0)
Lymphocytes Relative: 39.3 % (ref 12.0–46.0)
Lymphs Abs: 1.3 10*3/uL (ref 0.7–4.0)
MCHC: 34 g/dL (ref 30.0–36.0)
MCV: 96.5 fl (ref 78.0–100.0)
Monocytes Absolute: 0.4 10*3/uL (ref 0.1–1.0)
Monocytes Relative: 12.2 % — ABNORMAL HIGH (ref 3.0–12.0)
Neutro Abs: 1.5 10*3/uL (ref 1.4–7.7)
Neutrophils Relative %: 44.7 % (ref 43.0–77.0)
Platelets: 159 10*3/uL (ref 150.0–400.0)
RBC: 3.11 Mil/uL — ABNORMAL LOW (ref 4.22–5.81)
RDW: 13.5 % (ref 11.5–15.5)
WBC: 3.4 10*3/uL — ABNORMAL LOW (ref 4.0–10.5)

## 2019-11-01 LAB — BASIC METABOLIC PANEL
BUN: 28 mg/dL — ABNORMAL HIGH (ref 6–23)
CO2: 26 mEq/L (ref 19–32)
Calcium: 9.6 mg/dL (ref 8.4–10.5)
Chloride: 107 mEq/L (ref 96–112)
Creatinine, Ser: 2.33 mg/dL — ABNORMAL HIGH (ref 0.40–1.50)
GFR: 32.42 mL/min — ABNORMAL LOW (ref >60.00)
Glucose, Bld: 105 mg/dL — ABNORMAL HIGH (ref 70–99)
Potassium: 4.9 mEq/L (ref 3.5–5.1)
Sodium: 140 mEq/L (ref 135–145)

## 2019-11-01 MED ORDER — PANTOPRAZOLE SODIUM 40 MG PO TBEC: 40.0000 mg | DELAYED_RELEASE_TABLET | Freq: Every day | ORAL | 3 refills | Status: DC
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
## 2019-11-01 NOTE — Patient Instructions (Signed)
Please take all new medication as prescribed - the protonix (sent [facility])  Please continue all other medications as before, and refills have been done if requested.  Please have the pharmacy call with any other refills you may need.  Please continue your efforts at being more active, low cholesterol diet, and weight control.  You are otherwise up to date with prevention measures today.  Please keep your appointments with your specialists as you may have planned  Your EKG was OK today  You will be contacted regarding the referral for: Echocardiogram, and stress test  Please go to the XRAY Department in the first floor for the x-ray testing  Please go to the LAB at the blood drawing area for the tests to be done  You will be contacted by phone if any changes need to be made immediately.  Otherwise, you will receive a letter about your results with an explanation, but please check with MyChart first.  Please remember to sign up for MyChart if you have not done so, as this will be important to you in the future with finding out test results, communicating by private email, and scheduling acute appointments online when needed.

## 2019-11-02 ENCOUNTER — Encounter: Payer: Self-pay | Admitting: Internal Medicine

## 2019-11-02 ENCOUNTER — Telehealth (HOSPITAL_COMMUNITY): Payer: Self-pay | Admitting: Internal Medicine

## 2019-11-02 NOTE — Telephone Encounter (Signed)
I called to schedule patient Exercise Myoview and he declined to schedule due to he does not want his back to start hurting again since he is doing great with it right now.

## 2019-11-03 ENCOUNTER — Encounter: Payer: Self-pay | Admitting: Internal Medicine

## 2019-11-03 NOTE — Assessment & Plan Note (Signed)
For PPI, cont pepcid

## 2019-11-03 NOTE — Assessment & Plan Note (Signed)
stable overall by history and exam, recent data reviewed with pt, and pt to continue medical treatment as before,  to f/u any worsening symptoms or concerns  

## 2019-11-03 NOTE — Assessment & Plan Note (Addendum)
Etiology unclear, ecg reviewed, for echo, stress test  I spent 40 minutes in preparing to see the patient by review of recent labs, imaging and procedures, obtaining and reviewing separately obtained history, communicating with the patient and family or caregiver, ordering medications, tests or procedures, and documenting clinical information in the EHR including the differential Dx, treatment, and any further evaluation and other management of dyspnea, ckd, HLD, GERD, HTN, DM

## 2019-11-19 ENCOUNTER — Telehealth: Payer: Self-pay | Admitting: Cardiovascular Disease

## 2019-11-19 NOTE — Telephone Encounter (Signed)
Greg [name] is calling requesting his son attend his upcoming Echo scheduled for 11/20/19 due to him filling out all his paperwork. Please advise.

## 2019-11-20 ENCOUNTER — Encounter: Payer: Self-pay | Admitting: Internal Medicine

## 2019-11-20 ENCOUNTER — Other Ambulatory Visit: Payer: Self-pay

## 2019-11-20 ENCOUNTER — Ambulatory Visit (HOSPITAL_COMMUNITY): Payer: Medicare Other | Attending: Cardiology

## 2019-11-20 DIAGNOSIS — R0600 Dyspnea, unspecified: Secondary | ICD-10-CM | POA: Insufficient documentation

## 2019-11-20 DIAGNOSIS — R0609 Other forms of dyspnea: Secondary | ICD-10-CM

## 2019-11-20 NOTE — Telephone Encounter (Signed)
Called patient to let him know he will not need his son for his echo visit. Patient verbalized understanding and will come alone.

## 2019-11-23 NOTE — Progress Notes (Signed)
Greg [name] - 86 y.o. male MRN [id number]  Date of birth: November 27, 1932  Office Visit Note: Visit Date: 07/18/2019 PCP: Biagio Borg, MD Referred by: Biagio Borg, MD  Subjective: Chief Complaint  Patient presents with  . Lower Back - Pain   HPI:  Greg [name] is a 86 y.o. male who comes in today For planned bilateral L3 transforaminal epidural steroid injection.  He continues to be followed by [name], PA-C and Rodell Perna, MD.  We have done a few different injections over the last year or so.  He has had intermittent success.  He is having a lot of pain in the low back into both legs and hips.  He does have moderate multifactorial stenosis at L2-3 with somewhat transitional segment even though the MRI does not call this out.  We will complete bilateral L3 transforaminal injection with the lowest lumbar vertebral body counted as L5.  ROS Otherwise per HPI.  Assessment & Plan: Visit Diagnoses:  1. Spinal stenosis of lumbar region with neurogenic claudication     Plan: No additional findings.   Meds & Orders:  Meds ordered this encounter  Medications  . betamethasone acetate-betamethasone sodium phosphate (CELESTONE) injection 12 mg    Orders Placed This Encounter  Procedures  . XR C-ARM NO REPORT  . Epidural Steroid injection    Follow-up: Return if symptoms worsen or fail to improve.   Procedures: No procedures performed  Lumbosacral Transforaminal Epidural Steroid Injection - Sub-Pedicular Approach with Fluoroscopic Guidance  [name]      Date of Birth: [birth year] MRN: [id number] PCP: Biagio Borg, MD      Visit Date: 07/18/2019   Universal Protocol:    Date/Time: 07/18/2019  Consent Given By: the patient  Position: PRONE  Additional Comments: Vital signs were monitored before and after the procedure. Patient was prepped and draped in the usual sterile fashion. The correct patient, procedure, and site was verified.   Injection  Procedure Details:  Procedure Site One Meds Administered:  Meds ordered this encounter  Medications  . betamethasone acetate-betamethasone sodium phosphate (CELESTONE) injection 12 mg    Laterality: Bilateral  Location/Site:  L3-L4  Needle size: 22 G  Needle type: Spinal  Needle Placement: Transforaminal  Findings:    -Comments: Excellent flow of contrast along the nerve and into the epidural space.  Procedure Details: After squaring off the end-plates to get a true AP view, the C-arm was positioned so that an oblique view of the foramen as noted above was visualized. The target area is just inferior to the "nose of the scotty dog" or sub pedicular. The soft tissues overlying this structure were infiltrated with 2-3 ml. of 1% Lidocaine without Epinephrine.  The spinal needle was inserted toward the target using a "trajectory" view along the fluoroscope beam.  Under AP and lateral visualization, the needle was advanced so it did not puncture dura and was located close the 6 O'Clock position of the pedical in AP tracterory. Biplanar projections were used to confirm position. Aspiration was confirmed to be negative for CSF and/or blood. A 1-2 ml. volume of Isovue-250 was injected and flow of contrast was noted at each level. Radiographs were obtained for documentation purposes.   After attaining the desired flow of contrast documented above, a 0.5 to 1.0 ml test dose of 0.25% Marcaine was injected into each respective transforaminal space.  The patient was observed for 90 seconds post injection.  After no sensory  deficits were reported, and normal lower extremity motor function was noted,   the above injectate was administered so that equal amounts of the injectate were placed at each foramen (level) into the transforaminal epidural space.   Additional Comments:  The patient tolerated the procedure well Dressing: 2 x 2 sterile gauze and Band-Aid    Post-procedure details: Patient  was observed during the procedure. Post-procedure instructions were reviewed.  Patient left the clinic in stable condition.      Clinical History: MRI LUMBAR SPINE WITHOUT CONTRAST  TECHNIQUE: Multiplanar, multisequence MR imaging of the lumbar spine was performed. No intravenous contrast was administered.  COMPARISON:  09/22/2016  FINDINGS: Segmentation:  Standard.  Alignment: Minimal grade 1 anterolisthesis of L4 on L5 secondary to facet disease.  Vertebrae:  No fracture, evidence of discitis, or bone lesion.  Conus medullaris and cauda equina: Conus extends to the L1 level. Conus and cauda equina appear normal.  Paraspinal and other soft tissues: No acute paraspinal abnormality.  Disc levels:  Disc spaces: Degenerative disease with disc height loss at L2-3, L3-4 and L4-5.  T11-12: Mild broad-based disc bulge. Mild bilateral facet arthropathy. No foraminal stenosis.  T12-L1: No significant disc bulge. No evidence of neural foraminal stenosis. No central canal stenosis.  L1-L2: No significant disc bulge. No evidence of neural foraminal stenosis. No central canal stenosis.  L2-L3: Broad-based disc bulge. Moderate bilateral facet arthropathy. Mild left foraminal stenosis. No right foraminal stenosis. Moderate spinal stenosis.  L3-L4: Broad-based disc bulge. Mild bilateral facet arthropathy. Mild right foraminal narrowing. No left foraminal narrowing. No central canal stenosis.  L4-L5: Broad-based disc osteophyte complex. Moderate bilateral facet arthropathy. No evidence of neural foraminal stenosis. No central canal stenosis.  L5-S1: No significant disc bulge. No evidence of neural foraminal stenosis. No central canal stenosis.  IMPRESSION: 1. At L2-3 there is a broad-based disc bulge. Moderate bilateral facet arthropathy. Mild left foraminal stenosis. No right foraminal stenosis. Moderate spinal stenosis. 2. At L3-4 there is a  broad-based disc bulge. Mild bilateral facet arthropathy. Mild right foraminal narrowing. No left foraminal narrowing. 3. At L4-5 there is a broad-based disc osteophyte complex. Moderate bilateral facet arthropathy.   Electronically Signed   By: Kathreen Devoid   On: 05/20/2019 15:34     Objective:  VS:  HT:    WT:   BMI:     BP:(!) 144/87  HR:75bpm  TEMP: ( )  RESP:  Physical Exam  Ortho Exam Imaging: No results found.

## 2019-11-26 NOTE — Procedures (Signed)
Lumbosacral Transforaminal Epidural Steroid Injection - Sub-Pedicular Approach with Fluoroscopic Guidance  [name]      Date of Birth: [birth year] MRN: [id number] PCP: Biagio Borg, MD      Visit Date: 07/18/2019   Universal Protocol:    Date/Time: 07/18/2019  Consent Given By: the patient  Position: PRONE  Additional Comments: Vital signs were monitored before and after the procedure. Patient was prepped and draped in the usual sterile fashion. The correct patient, procedure, and site was verified.   Injection Procedure Details:  Procedure Site One Meds Administered:  Meds ordered this encounter  Medications  . betamethasone acetate-betamethasone sodium phosphate (CELESTONE) injection 12 mg    Laterality: Bilateral  Location/Site:  L3-L4  Needle size: 22 G  Needle type: Spinal  Needle Placement: Transforaminal  Findings:    -Comments: Excellent flow of contrast along the nerve and into the epidural space.  Procedure Details: After squaring off the end-plates to get a true AP view, the C-arm was positioned so that an oblique view of the foramen as noted above was visualized. The target area is just inferior to the "nose of the scotty dog" or sub pedicular. The soft tissues overlying this structure were infiltrated with 2-3 ml. of 1% Lidocaine without Epinephrine.  The spinal needle was inserted toward the target using a "trajectory" view along the fluoroscope beam.  Under AP and lateral visualization, the needle was advanced so it did not puncture dura and was located close the 6 O'Clock position of the pedical in AP tracterory. Biplanar projections were used to confirm position. Aspiration was confirmed to be negative for CSF and/or blood. A 1-2 ml. volume of Isovue-250 was injected and flow of contrast was noted at each level. Radiographs were obtained for documentation purposes.   After attaining the desired flow of contrast documented above, a 0.5 to  1.0 ml test dose of 0.25% Marcaine was injected into each respective transforaminal space.  The patient was observed for 90 seconds post injection.  After no sensory deficits were reported, and normal lower extremity motor function was noted,   the above injectate was administered so that equal amounts of the injectate were placed at each foramen (level) into the transforaminal epidural space.   Additional Comments:  The patient tolerated the procedure well Dressing: 2 x 2 sterile gauze and Band-Aid    Post-procedure details: Patient was observed during the procedure. Post-procedure instructions were reviewed.  Patient left the clinic in stable condition.

## 2019-11-28 ENCOUNTER — Telehealth: Payer: Self-pay | Admitting: Internal Medicine

## 2019-11-28 NOTE — Telephone Encounter (Signed)
Ok for centrum silver - 1 per day ?

## 2019-11-28 NOTE — Telephone Encounter (Signed)
   Patient calling to discuss Multiple Vitamins-Minerals (PROTEGRA PO), med has been discontinued. Patient wants to advice on what vitamin he should be taking

## 2019-11-29 NOTE — Telephone Encounter (Signed)
Spoke with patient and info given 

## 2020-02-12 DIAGNOSIS — D631 Anemia in chronic kidney disease: Secondary | ICD-10-CM | POA: Diagnosis not present

## 2020-02-12 DIAGNOSIS — D472 Monoclonal gammopathy: Secondary | ICD-10-CM | POA: Diagnosis not present

## 2020-02-12 DIAGNOSIS — I129 Hypertensive chronic kidney disease with stage 1 through stage 4 chronic kidney disease, or unspecified chronic kidney disease: Secondary | ICD-10-CM | POA: Diagnosis not present

## 2020-02-12 DIAGNOSIS — N1832 Chronic kidney disease, stage 3b: Secondary | ICD-10-CM | POA: Diagnosis not present

## 2020-02-18 ENCOUNTER — Ambulatory Visit (INDEPENDENT_AMBULATORY_CARE_PROVIDER_SITE_OTHER): Payer: Medicare Other

## 2020-02-18 DIAGNOSIS — Z0000 Encounter for general adult medical examination without abnormal findings: Secondary | ICD-10-CM
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
## 2020-02-18 NOTE — Patient Instructions (Signed)
[name] , Thank you for taking time to come for your Medicare Wellness Visit. I appreciate your ongoing commitment to your health goals. Please review the following plan we discussed and let me know if I can assist you in the future.   Screening recommendations/referrals: Colonoscopy: last done 02/09/2012; no repeat due to age Recommended yearly ophthalmology/optometry visit for glaucoma screening and checkup Recommended yearly dental visit for hygiene and checkup  Vaccinations: Influenza vaccine: 05/14/2019 Pneumococcal vaccine: completed Tdap vaccine: 01/18/2012; due every 10 years Shingles vaccine: completed per patient; need documentation   Covid-19: completed per patient; need documentation  Advanced directives: Please bring a copy of your health care power of attorney and living will to the office at your convenience.  Conditions/risks identified: Please continue to do your personal lifestyle choices by: daily care of teeth and gums, regular physical activity (goal should be 5 days a week for 30 minutes), eat a healthy diet, avoid tobacco and drug use, limiting any alcohol intake, taking a low-dose aspirin (if not allergic or have been advised by your provider otherwise) and taking vitamins and minerals as recommended by your provider. Continue doing brain stimulating activities (puzzles, reading, adult coloring books, staying active) to keep memory sharp. Continue to eat heart healthy diet (full of fruits, vegetables, whole grains, lean protein, water--limit salt, fat, and sugar intake) and increase physical activity as tolerated.   Next appointment: Please schedule your next Medicare Wellness Visit with your Nurse Health Advisor in 1 year.  Preventive Care 32 Years and Older, Male Preventive care refers to lifestyle choices and visits with your health care provider that can promote health and wellness. What does preventive care include?  A yearly physical exam. This is also called an  annual well check.  Dental exams once or twice a year.  Routine eye exams. Ask your health care provider how often you should have your eyes checked.  Personal lifestyle choices, including:  Daily care of your teeth and gums.  Regular physical activity.  Eating a healthy diet.  Avoiding tobacco and drug use.  Limiting alcohol use.  Practicing safe sex.  Taking low doses of aspirin every day.  Taking vitamin and mineral supplements as recommended by your health care provider. What happens during an annual well check? The services and screenings done by your health care provider during your annual well check will depend on your age, overall health, lifestyle risk factors, and family history of disease. Counseling  Your health care provider may ask you questions about your:  Alcohol use.  Tobacco use.  Drug use.  Emotional well-being.  Home and relationship well-being.  Sexual activity.  Eating habits.  History of falls.  Memory and ability to understand (cognition).  Work and work Statistician. Screening  You may have the following tests or measurements:  Height, weight, and BMI.  Blood pressure.  Lipid and cholesterol levels. These may be checked every 5 years, or more frequently if you are over 26 years old.  Skin check.  Lung cancer screening. You may have this screening every year starting at age 71 if you have a 30-pack-year history of smoking and currently smoke or have quit within the past 15 years.  Fecal occult blood test (FOBT) of the stool. You may have this test every year starting at age 23.  Flexible sigmoidoscopy or colonoscopy. You may have a sigmoidoscopy every 5 years or a colonoscopy every 10 years starting at age 14.  Prostate cancer screening. Recommendations will vary depending  on your family history and other risks.  Hepatitis C blood test.  Hepatitis B blood test.  Sexually transmitted disease (STD) testing.  Diabetes  screening. This is done by checking your blood sugar (glucose) after you have not eaten for a while (fasting). You may have this done every 1-3 years.  Abdominal aortic aneurysm (AAA) screening. You may need this if you are a current or former smoker.  Osteoporosis. You may be screened starting at age 74 if you are at high risk. Talk with your health care provider about your test results, treatment options, and if necessary, the need for more tests. Vaccines  Your health care provider may recommend certain vaccines, such as:  Influenza vaccine. This is recommended every year.  Tetanus, diphtheria, and acellular pertussis (Tdap, Td) vaccine. You may need a Td booster every 10 years.  Zoster vaccine. You may need this after age 64.  Pneumococcal 13-valent conjugate (PCV13) vaccine. One dose is recommended after age 56.  Pneumococcal polysaccharide (PPSV23) vaccine. One dose is recommended after age 10. Talk to your health care provider about which screenings and vaccines you need and how often you need them. This information is not intended to replace advice given to you by your health care provider. Make sure you discuss any questions you have with your health care provider. Document Released: 09/12/2015 Document Revised: 05/05/2016 Document Reviewed: 06/17/2015 Elsevier Interactive Patient Education  2017 [employer] Prevention in the Home Falls can cause injuries. They can happen to people of all ages. There are many things you can do to make your home safe and to help prevent falls. What can I do on the outside of my home?  Regularly fix the edges of walkways and driveways and fix any cracks.  Remove anything that might make you trip as you walk through a door, such as a raised step or threshold.  Trim any bushes or trees on the path to your home.  Use bright outdoor lighting.  Clear any walking paths of anything that might make someone trip, such as rocks or  tools.  Regularly check to see if handrails are loose or broken. Make sure that both sides of any steps have handrails.  Any raised decks and porches should have guardrails on the edges.  Have any leaves, snow, or ice cleared regularly.  Use sand or salt on walking paths during winter.  Clean up any spills in your garage right away. This includes oil or grease spills. What can I do in the bathroom?  Use night lights.  Install grab bars by the toilet and in the tub and shower. Do not use towel bars as grab bars.  Use non-skid mats or decals in the tub or shower.  If you need to sit down in the shower, use a plastic, non-slip stool.  Keep the floor dry. Clean up any water that spills on the floor as soon as it happens.  Remove soap buildup in the tub or shower regularly.  Attach bath mats securely with double-sided non-slip rug tape.  Do not have throw rugs and other things on the floor that can make you trip. What can I do in the bedroom?  Use night lights.  Make sure that you have a light by your bed that is easy to reach.  Do not use any sheets or blankets that are too big for your bed. They should not hang down onto the floor.  Have a firm chair that has side arms.  You can use this for support while you get dressed.  Do not have throw rugs and other things on the floor that can make you trip. What can I do in the kitchen?  Clean up any spills right away.  Avoid walking on wet floors.  Keep items that you use a lot in easy-to-reach places.  If you need to reach something above you, use a strong step stool that has a grab bar.  Keep electrical cords out of the way.  Do not use floor polish or wax that makes floors slippery. If you must use wax, use non-skid floor wax.  Do not have throw rugs and other things on the floor that can make you trip. What can I do with my stairs?  Do not leave any items on the stairs.  Make sure that there are handrails on both  sides of the stairs and use them. Fix handrails that are broken or loose. Make sure that handrails are as long as the stairways.  Check any carpeting to make sure that it is firmly attached to the stairs. Fix any carpet that is loose or worn.  Avoid having throw rugs at the top or bottom of the stairs. If you do have throw rugs, attach them to the floor with carpet tape.  Make sure that you have a light switch at the top of the stairs and the bottom of the stairs. If you do not have them, ask someone to add them for you. What else can I do to help prevent falls?  Wear shoes that:  Do not have high heels.  Have rubber bottoms.  Are comfortable and fit you well.  Are closed at the toe. Do not wear sandals.  If you use a stepladder:  Make sure that it is fully opened. Do not climb a closed stepladder.  Make sure that both sides of the stepladder are locked into place.  Ask someone to hold it for you, if possible.  Clearly mark and make sure that you can see:  Any grab bars or handrails.  First and last steps.  Where the edge of each step is.  Use tools that help you move around (mobility aids) if they are needed. These include:  Canes.  Walkers.  Scooters.  Crutches.  Turn on the lights when you go into a dark area. Replace any light bulbs as soon as they burn out.  Set up your furniture so you have a clear path. Avoid moving your furniture around.  If any of your floors are uneven, fix them.  If there are any pets around you, be aware of where they are.  Review your medicines with your doctor. Some medicines can make you feel dizzy. This can increase your chance of falling. Ask your doctor what other things that you can do to help prevent falls. This information is not intended to replace advice given to you by your health care provider. Make sure you discuss any questions you have with your health care provider. Document Released: 06/12/2009 Document Revised:  01/22/2016 Document Reviewed: 09/20/2014 Elsevier Interactive Patient Education  2017 [employer].

## 2020-03-06 ENCOUNTER — Other Ambulatory Visit: Payer: Self-pay | Admitting: Internal Medicine

## 2020-03-06 NOTE — Telephone Encounter (Signed)
Please refill as per office routine med refill policy (all routine meds refilled for 3 mo or monthly per pt preference up to one year from last visit, then month to month grace period for 3 mo, then further med refills will have to be denied)  

## 2020-03-17 ENCOUNTER — Other Ambulatory Visit: Payer: Self-pay

## 2020-03-17 ENCOUNTER — Telehealth: Payer: Self-pay | Admitting: Internal Medicine

## 2020-03-17 MED ORDER — ACCU-CHEK GUIDE VI STRP: 1.0000 | ORAL_STRIP | Freq: Every day | 1 refills | Status: DC | PRN

## 2020-03-17 NOTE — Telephone Encounter (Signed)
   1.Medication Requested: glucose blood (ACCU-CHEK GUIDE) test strip  2. Pharmacy (Name, Street, [place], [place] - [address] AT [place]  3. On Med List: yes  4. Last Visit with PCP: 11/01/19  5. Next visit date with PCP: 03/20/20   Agent: Please be advised that RX refills may take up to 3 business days. We ask that you follow-up with your pharmacy.

## 2020-03-20 ENCOUNTER — Encounter: Payer: Self-pay | Admitting: Internal Medicine

## 2020-03-20 ENCOUNTER — Ambulatory Visit (INDEPENDENT_AMBULATORY_CARE_PROVIDER_SITE_OTHER): Payer: Medicare Other | Admitting: Internal Medicine

## 2020-03-20 ENCOUNTER — Other Ambulatory Visit: Payer: Self-pay

## 2020-03-20 VITALS — BP 140/80 | HR 68 | Temp 98.3°F | Ht 69.0 in | Wt 180.0 lb

## 2020-03-20 DIAGNOSIS — I10 Essential (primary) hypertension: Secondary | ICD-10-CM | POA: Diagnosis not present

## 2020-03-20 DIAGNOSIS — E039 Hypothyroidism, unspecified: Secondary | ICD-10-CM

## 2020-03-20 DIAGNOSIS — N183 Chronic kidney disease, stage 3 unspecified: Secondary | ICD-10-CM

## 2020-03-20 DIAGNOSIS — R7309 Other abnormal glucose: Secondary | ICD-10-CM | POA: Diagnosis not present

## 2020-03-20 NOTE — Patient Instructions (Signed)
Please continue all other medications as before, and refills have been done if requested.  Please have the pharmacy call with any other refills you may need.  Please continue your efforts at being more active, low cholesterol diet, and weight control.  Please keep your appointments with your specialists as you may have planned  Please go to the LAB at the blood drawing area for the tests to be done  You will be contacted by phone if any changes need to be made immediately.  Otherwise, you will receive a letter about your results with an explanation, but please check with MyChart first.  Please remember to sign up for MyChart if you have not done so, as this will be important to you in the future with finding out test results, communicating by private email, and scheduling acute appointments online when needed.  Please make an Appointment to return in 6 months, or sooner if needed, also with Lab Appointment for testing done 3-5 days before at the FIRST FLOOR Lab (so this is for TWO appointments - please see the scheduling desk as you leave)  
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
## 2020-03-21 ENCOUNTER — Telehealth: Payer: Self-pay

## 2020-03-21 NOTE — Telephone Encounter (Signed)
New message    1.Medication Requested:glucose blood (ACCU-CHEK GUIDE) test strip  2. Pharmacy (Name, Street, [name], [place] - [address] AT [place]  3. On Med List: Yes   4. Last Visit with PCP: 7.22.21   5. Next visit date with PCP: 1.27.22    Agent: Please be advised that RX refills may take up to 3 business days. We ask that you follow-up with your pharmacy.

## 2020-03-22 ENCOUNTER — Encounter: Payer: Self-pay | Admitting: Internal Medicine

## 2020-03-22 NOTE — Assessment & Plan Note (Addendum)

## 2020-03-22 NOTE — Assessment & Plan Note (Signed)
stable overall by history and exam, recent data reviewed with pt, and pt to continue medical treatment as before,  to f/u any worsening symptoms or concerns  

## 2020-03-24 ENCOUNTER — Other Ambulatory Visit: Payer: Self-pay | Admitting: Internal Medicine

## 2020-03-25 NOTE — Telephone Encounter (Signed)
Please refill as per office routine med refill policy (all routine meds refilled for 3 mo or monthly per pt preference up to one year from last visit, then month to month grace period for 3 mo, then further med refills will have to be denied)  

## 2020-04-30 ENCOUNTER — Telehealth: Payer: Self-pay | Admitting: Internal Medicine

## 2020-04-30 NOTE — Telephone Encounter (Signed)
New Message:   1.Medication Requested: ACCU-CHEK GUIDE test strip 2. Pharmacy (Name, Street, [place]): [place] 3. On Med List: yes  4. Last Visit with PCP:   5. Next visit date with PCP:   Agent: Please be advised that RX refills may take up to 3 business days. We ask that you follow-up with your pharmacy.

## 2020-05-07 ENCOUNTER — Other Ambulatory Visit: Payer: Self-pay

## 2020-05-07 ENCOUNTER — Encounter (INDEPENDENT_AMBULATORY_CARE_PROVIDER_SITE_OTHER): Payer: Medicare Other | Admitting: Ophthalmology

## 2020-05-07 DIAGNOSIS — H35032 Hypertensive retinopathy, left eye: Secondary | ICD-10-CM | POA: Diagnosis not present

## 2020-05-07 DIAGNOSIS — I10 Essential (primary) hypertension: Secondary | ICD-10-CM | POA: Diagnosis not present

## 2020-05-07 DIAGNOSIS — H353122 Nonexudative age-related macular degeneration, left eye, intermediate dry stage: Secondary | ICD-10-CM

## 2020-05-07 DIAGNOSIS — D3132 Benign neoplasm of left choroid: Secondary | ICD-10-CM

## 2020-05-07 DIAGNOSIS — H43813 Vitreous degeneration, bilateral: Secondary | ICD-10-CM | POA: Diagnosis not present

## 2020-05-07 DIAGNOSIS — H2512 Age-related nuclear cataract, left eye: Secondary | ICD-10-CM

## 2020-05-14 ENCOUNTER — Other Ambulatory Visit: Payer: Self-pay | Admitting: Internal Medicine

## 2020-05-14 NOTE — Telephone Encounter (Signed)
Please refill as per office routine med refill policy (all routine meds refilled for 3 mo or monthly per pt preference up to one year from last visit, then month to month grace period for 3 mo, then further med refills will have to be denied)  

## 2020-06-24 ENCOUNTER — Telehealth: Payer: Self-pay | Admitting: Internal Medicine

## 2020-06-24 NOTE — Telephone Encounter (Signed)
° °  Patient wants to know if Dr Judi Cong feels he should get a covid booster

## 2020-06-24 NOTE — Telephone Encounter (Signed)
Sent to Dr. John. 

## 2020-06-24 NOTE — Telephone Encounter (Signed)
Yes this is highly advised to be done at 6 mo after the last covid shot, or 90 days after an infection

## 2020-06-28 ENCOUNTER — Ambulatory Visit (INDEPENDENT_AMBULATORY_CARE_PROVIDER_SITE_OTHER): Payer: Medicare Other

## 2020-06-28 DIAGNOSIS — Z23 Encounter for immunization: Secondary | ICD-10-CM | POA: Diagnosis not present

## 2020-07-01 NOTE — Telephone Encounter (Signed)
LVM for pt of Dr. Gwynn Burly recommendations.

## 2020-07-07 ENCOUNTER — Other Ambulatory Visit: Payer: Self-pay

## 2020-07-07 ENCOUNTER — Ambulatory Visit (INDEPENDENT_AMBULATORY_CARE_PROVIDER_SITE_OTHER): Payer: Medicare Other | Admitting: Family

## 2020-07-07 ENCOUNTER — Other Ambulatory Visit: Payer: Self-pay | Admitting: Family

## 2020-07-07 ENCOUNTER — Encounter: Payer: Self-pay | Admitting: Family

## 2020-07-07 VITALS — BP 210/80 | HR 62 | Temp 98.1°F | Ht 69.0 in | Wt 183.4 lb

## 2020-07-07 DIAGNOSIS — I10 Essential (primary) hypertension: Secondary | ICD-10-CM | POA: Diagnosis not present

## 2020-07-07 LAB — CBC WITH DIFFERENTIAL/PLATELET
Basophils Absolute: 0 10*3/uL (ref 0.0–0.1)
Basophils Relative: 0.6 % (ref 0.0–3.0)
Eosinophils Absolute: 0.1 10*3/uL (ref 0.0–0.7)
Eosinophils Relative: 2.6 % (ref 0.0–5.0)
HCT: 30 % — ABNORMAL LOW (ref 39.0–52.0)
Hemoglobin: 10.2 g/dL — ABNORMAL LOW (ref 13.0–17.0)
Lymphocytes Relative: 39.3 % (ref 12.0–46.0)
Lymphs Abs: 1.5 10*3/uL (ref 0.7–4.0)
MCHC: 33.9 g/dL (ref 30.0–36.0)
MCV: 95.9 fl (ref 78.0–100.0)
Monocytes Absolute: 0.4 10*3/uL (ref 0.1–1.0)
Monocytes Relative: 10.9 % (ref 3.0–12.0)
Neutro Abs: 1.8 10*3/uL (ref 1.4–7.7)
Neutrophils Relative %: 46.6 % (ref 43.0–77.0)
Platelets: 170 10*3/uL (ref 150.0–400.0)
RBC: 3.12 Mil/uL — ABNORMAL LOW (ref 4.22–5.81)
RDW: 13.8 % (ref 11.5–15.5)
WBC: 3.9 10*3/uL — ABNORMAL LOW (ref 4.0–10.5)

## 2020-07-07 LAB — COMPREHENSIVE METABOLIC PANEL
ALT: 18 U/L (ref 0–53)
AST: 22 U/L (ref 0–37)
Albumin: 4.1 g/dL (ref 3.5–5.2)
Alkaline Phosphatase: 46 U/L (ref 39–117)
BUN: 23 mg/dL (ref 6–23)
CO2: 26 mEq/L (ref 19–32)
Calcium: 9.3 mg/dL (ref 8.4–10.5)
Chloride: 105 mEq/L (ref 96–112)
Creatinine, Ser: 2.28 mg/dL — ABNORMAL HIGH (ref 0.40–1.50)
GFR: 25.59 mL/min — ABNORMAL LOW (ref >60.00)
Glucose, Bld: 99 mg/dL (ref 70–99)
Potassium: 4.6 mEq/L (ref 3.5–5.1)
Sodium: 139 mEq/L (ref 135–145)
Total Bilirubin: 0.3 mg/dL (ref 0.2–1.2)
Total Protein: 6.6 g/dL (ref 6.0–8.3)

## 2020-07-07 MED ORDER — AMLODIPINE BESYLATE 5 MG PO TABS: 5.0000 mg | ORAL_TABLET | Freq: Every day | ORAL | 1 refills | Status: DC
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
## 2020-07-14 ENCOUNTER — Ambulatory Visit: Payer: Medicare Other | Attending: Internal Medicine

## 2020-07-14 ENCOUNTER — Ambulatory Visit: Payer: Self-pay

## 2020-07-14 DIAGNOSIS — Z23 Encounter for immunization: Secondary | ICD-10-CM

## 2020-07-14 NOTE — Progress Notes (Signed)
   Covid-19 Vaccination Clinic  Name:  Greg [name]    MRN: [id number] DOB: 10-05-1932  07/14/2020  [name] was observed post Covid-19 immunization for 15 minutes without incident. He was provided with Vaccine Information Sheet and instruction to access the V-Safe system.   [name] was instructed to call 911 with any severe reactions post vaccine: [name] Difficulty breathing  . Swelling of face and throat  . A fast heartbeat  . A bad rash all over body  . Dizziness and weakness   Immunizations Administered    Name Date Dose VIS Date Route   Pfizer COVID-19 Vaccine 07/14/2020  1:15 PM 0.3 mL 06/18/2020 Intramuscular   Manufacturer: [place]   Lot: [id number]   [place]: [id number]

## 2020-07-21 DIAGNOSIS — H40033 Anatomical narrow angle, bilateral: Secondary | ICD-10-CM | POA: Diagnosis not present

## 2020-07-21 DIAGNOSIS — H1013 Acute atopic conjunctivitis, bilateral: Secondary | ICD-10-CM | POA: Diagnosis not present
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
## 2020-08-04 ENCOUNTER — Encounter: Payer: Self-pay | Admitting: Cardiovascular Disease

## 2020-08-04 ENCOUNTER — Ambulatory Visit (INDEPENDENT_AMBULATORY_CARE_PROVIDER_SITE_OTHER): Payer: Medicare Other | Admitting: Cardiovascular Disease

## 2020-08-04 ENCOUNTER — Other Ambulatory Visit: Payer: Self-pay

## 2020-08-04 VITALS — BP 144/76 | HR 70 | Ht 69.0 in | Wt 181.0 lb

## 2020-08-04 DIAGNOSIS — I10 Essential (primary) hypertension: Secondary | ICD-10-CM | POA: Diagnosis not present

## 2020-08-04 NOTE — Patient Instructions (Signed)
Medication Instructions:  *If you need a refill on your cardiac medications before your next appointment, please call your pharmacy*  Lab Work: If you have labs (blood work) drawn today and your tests are completely normal, you will receive your results only by: . MyChart Message (if you have MyChart) OR . A paper copy in the mail If you have any lab test that is abnormal or we need to change your treatment, we will call you to review the results.  Testing/Procedures: None ordered today.  Follow-Up: At [facility], you and your health needs are our priority.  As part of our continuing mission to provide you with exceptional heart care, we have created designated Provider Care Teams.  These Care Teams include your primary Cardiologist (physician) and Advanced Practice Providers (APPs -  Physician Assistants and Nurse Practitioners) who all work together to provide you with the care you need, when you need it.  We recommend signing up for the patient portal called "MyChart".  Sign up information is provided on this After Visit Summary.  MyChart is used to connect with patients for Virtual Visits (Telemedicine).  Patients are able to view lab/test results, encounter notes, upcoming appointments, etc.  Non-urgent messages can be sent to your provider as well.   To learn more about what you can do with MyChart, go to [email or web address].    Your next appointment:   As needed  The format for your next appointment:   In Person  Provider:   You may see Dr. Nishan or one of the following Advanced Practice Providers on your designated Care Team:    Lori Gerhardt, NP  Laura Ingold, NP  Jill McDaniel, NP   

## 2020-08-15 ENCOUNTER — Encounter: Payer: Self-pay | Admitting: Internal Medicine

## 2020-08-15 ENCOUNTER — Ambulatory Visit (INDEPENDENT_AMBULATORY_CARE_PROVIDER_SITE_OTHER): Payer: Medicare Other | Admitting: Internal Medicine

## 2020-08-15 ENCOUNTER — Other Ambulatory Visit: Payer: Self-pay

## 2020-08-15 ENCOUNTER — Ambulatory Visit (INDEPENDENT_AMBULATORY_CARE_PROVIDER_SITE_OTHER): Payer: Medicare Other

## 2020-08-15 VITALS — BP 140/80 | HR 77 | Temp 98.2°F | Ht 69.0 in | Wt 181.0 lb

## 2020-08-15 DIAGNOSIS — M25512 Pain in left shoulder: Secondary | ICD-10-CM

## 2020-08-15 DIAGNOSIS — E7800 Pure hypercholesterolemia, unspecified: Secondary | ICD-10-CM

## 2020-08-15 DIAGNOSIS — N1831 Chronic kidney disease, stage 3a: Secondary | ICD-10-CM

## 2020-08-15 DIAGNOSIS — R059 Cough, unspecified: Secondary | ICD-10-CM | POA: Diagnosis not present

## 2020-08-15 DIAGNOSIS — M19012 Primary osteoarthritis, left shoulder: Secondary | ICD-10-CM | POA: Diagnosis not present

## 2020-08-15 DIAGNOSIS — R202 Paresthesia of skin: Secondary | ICD-10-CM | POA: Diagnosis not present

## 2020-08-15 DIAGNOSIS — I10 Essential (primary) hypertension: Secondary | ICD-10-CM

## 2020-08-15 DIAGNOSIS — R7309 Other abnormal glucose: Secondary | ICD-10-CM

## 2020-08-15 NOTE — Patient Instructions (Signed)
Please continue all other medications as before, and refills have been done if requested.  Please have the pharmacy call with any other refills you may need.  Please continue your efforts at being more active, low cholesterol diet, and weight control.  You are otherwise up to date with prevention measures today.  Please keep your appointments with your specialists as you may have planned  Please go to the XRAY Department in the first floor for the x-ray testing  You will be contacted by phone if any changes need to be made immediately.  Otherwise, you will receive a letter about your results with an explanation, but please check with MyChart first.  Please remember to sign up for MyChart if you have not done so, as this will be important to you in the future with finding out test results, communicating by private email, and scheduling acute appointments online when needed.  Please make an Appointment to return in 6 months, or sooner if needed 
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
## 2020-08-16 ENCOUNTER — Encounter: Payer: Self-pay | Admitting: Internal Medicine

## 2020-08-16 NOTE — Assessment & Plan Note (Addendum)
?   rotater cuff disorder - for shoudler film, also refer sport medicine  I spent 41 minutes in preparing to see the patient by review of recent labs, imaging and procedures, obtaining and reviewing separately obtained history, communicating with the patient and family or caregiver, ordering medications, tests or procedures, and documenting clinical information in the EHR including the differential Dx, treatment, and any further evaluation and other management of left shoulder pain, left hand paresthesias, hld, htn, dm, ckd

## 2020-08-16 NOTE — Assessment & Plan Note (Signed)
cbg's in 95-110 range at home per pt, stable overall by history and exam, recent data reviewed with pt, and pt to continue medical treatment as before,  to f/u any worsening symptoms or concerns, for f/u lab

## 2020-08-16 NOTE — Assessment & Plan Note (Signed)
stable overall by history and exam, recent data reviewed with pt, and pt to continue medical treatment as before,  to f/u any worsening symptoms or concerns, to continue statin 

## 2020-08-16 NOTE — Assessment & Plan Note (Signed)
Exam benign, cont to follow for worsening s/s such as left CTS

## 2020-08-16 NOTE — Addendum Note (Signed)
Addended by: Biagio Borg on: 08/16/2020 05:44 PM   Modules accepted: Orders

## 2020-08-16 NOTE — Assessment & Plan Note (Signed)
stable overall by history and exam, recent data reviewed with pt, and pt to continue medical treatment as before,  to f/u any worsening symptoms or concerns, for f/u lab 

## 2020-08-16 NOTE — Assessment & Plan Note (Signed)
stable overall by history and exam, recent data reviewed with pt, and pt to continue medical treatment as before,  to f/u any worsening symptoms or concerns  

## 2020-08-16 NOTE — Assessment & Plan Note (Signed)
Exam benign, also for cxr 

## 2020-08-17 ENCOUNTER — Encounter: Payer: Self-pay | Admitting: Internal Medicine

## 2020-08-19 ENCOUNTER — Telehealth: Payer: Self-pay | Admitting: Internal Medicine

## 2020-08-19 NOTE — Telephone Encounter (Signed)
Patient would like to go over his results of his xray from the 17th

## 2020-08-20 NOTE — Telephone Encounter (Signed)
I was able to [facility] for the pt of his negative X-ray's per Dr. Jenny Reichmann pt is to continue his same tx as before.

## 2020-08-26 ENCOUNTER — Other Ambulatory Visit: Payer: Self-pay | Admitting: Internal Medicine

## 2020-08-27 NOTE — Progress Notes (Signed)
Subjective:   I, Greg [name], LAT, ATC, am serving as scribe for Dr. Lynne Leader.  I'm seeing this patient as a consultation for Greg Cower, MD. Note will be routed back to referring provider/PCP.  CC: Left shoulder pain  HPI: Pt is a 86 y/o male presenting w/ c/o worsening L shoulder pain x few weeks.  He locates pain to his L lateral shoulder.  Pain was worse a few weeks ago.  Over spontaneously over the last few days the pain has improved quite a bit.  He feels pretty happy with how things are feeling now.  Radiating pain: yes into his L upper arm L shoulder mechanical symptoms: No UE numbness/tingling: yes in L fingers 2-4 Aggravates: nothing in particular; nagging pain all the time Rx tried: None  Dx imaging: 08/15/20 L shoulder XR  08/15/20 Chest XR   Past medical history, Surgical history, Family history, Social history, Allergies, and medications have been entered into the medical record, reviewed.   Review of Systems: No new headache, visual changes, nausea, vomiting, diarrhea, constipation, dizziness, abdominal pain, skin rash, fevers, chills, night sweats, weight loss, swollen lymph nodes, body aches, joint swelling, muscle aches, chest pain, shortness of breath, mood changes, visual or auditory hallucinations.   Objective:    Vitals:   08/28/20 1426  BP: (!) 160/88  Pulse: 87  SpO2: 98%   General: Well Developed, well nourished, and in no acute distress.  Neuro/Psych: Alert and oriented x3, extra-ocular muscles intact, able to move all 4 extremities, sensation grossly intact. Skin: Warm and dry, no rashes noted.  Respiratory: Not using accessory muscles, speaking in full sentences, trachea midline.  Cardiovascular: Pulses palpable, no extremity edema. Abdomen: Does not appear distended. MSK: Left shoulder normal-appearing normal motion to abduction external and internal rotation. Normal strength.  Lab and Radiology Results EXAM: LEFT SHOULDER - 2+  VIEW  COMPARISON:  None.  FINDINGS: There is moderate left shoulder osteoarthritis. There is a type 2 acromion. There is no acute displaced fracture or dislocation. The osseous mineralization is decreased. Aortic calcifications are noted.  IMPRESSION: Moderate left shoulder osteoarthritis. No acute displaced fracture or dislocation.   Electronically Signed   By: Constance Holster M.D.   On: 08/16/2020 21:39 I, Lynne Leader, personally (independently) visualized and performed the interpretation of the images attached in this note.   Impression and Recommendations:    Assessment and Plan: 86 y.o. male with left shoulder pain thought to be due to arthritis of the glenohumeral joint.. When Greg [name] made this appointment his pain is worse than it is now.  At this point he is doing pretty well.  We discussed some management strategies.  Recommended some simple home exercise program and recommended using Tylenol or Voltaren gel in the future if needed.  Also discussed that I am happy to proceed with a steroid injection for his shoulder in the future if needed as well.  However for now we will proceed with watchful waiting.  Recheck back as needed.  PDMP not reviewed this encounter. Orders Placed This Encounter  Procedures  . [place] LIMITED JOINT SPACE STRUCTURES UP LEFT(NO LINKED CHARGES)    Order Specific Question:   Reason for Exam (SYMPTOM  OR DIAGNOSIS REQUIRED)    Answer:   L shoulder pain    Order Specific Question:   Preferred imaging location?    Answer:   [place]   No orders of the defined types were placed in this encounter.   Discussed  warning signs or symptoms. Please see discharge instructions. Patient expresses understanding.   The above documentation has been reviewed and is accurate and complete Lynne Leader, M.D.

## 2020-08-27 NOTE — Telephone Encounter (Signed)
Please refill as per office routine med refill policy (all routine meds refilled for 3 mo or monthly per pt preference up to one year from last visit, then month to month grace period for 3 mo, then further med refills will have to be denied)  

## 2020-08-28 ENCOUNTER — Encounter: Payer: Self-pay | Admitting: Family Medicine

## 2020-08-28 ENCOUNTER — Ambulatory Visit (INDEPENDENT_AMBULATORY_CARE_PROVIDER_SITE_OTHER): Payer: Medicare Other | Admitting: Family Medicine

## 2020-08-28 ENCOUNTER — Ambulatory Visit: Payer: Self-pay

## 2020-08-28 ENCOUNTER — Other Ambulatory Visit: Payer: Self-pay

## 2020-08-28 VITALS — BP 160/88 | HR 87 | Ht 69.0 in | Wt 181.0 lb

## 2020-08-28 DIAGNOSIS — M25512 Pain in left shoulder: Secondary | ICD-10-CM | POA: Diagnosis not present

## 2020-08-28 NOTE — Patient Instructions (Signed)
Thank you for coming in today.  I think the reason why your shoulder hurts is arthritis.   Ok to use tylenol or even over the counter voltaren gel on this if needed.   I can do a cortisone shot at any time.   Recheck with me as needed.

## 2020-09-24 ENCOUNTER — Other Ambulatory Visit: Payer: Self-pay | Admitting: Internal Medicine

## 2020-09-25 ENCOUNTER — Ambulatory Visit: Payer: Medicare Other | Admitting: Internal Medicine

## 2020-09-25 NOTE — Telephone Encounter (Signed)
Please refill as per office routine med refill policy (all routine meds refilled for 3 mo or monthly per pt preference up to one year from last visit, then month to month grace period for 3 mo, then further med refills will have to be denied)  

## 2020-10-09 ENCOUNTER — Other Ambulatory Visit: Payer: Self-pay

## 2020-10-09 ENCOUNTER — Encounter: Payer: Self-pay | Admitting: Internal Medicine

## 2020-10-09 ENCOUNTER — Ambulatory Visit (INDEPENDENT_AMBULATORY_CARE_PROVIDER_SITE_OTHER): Payer: Medicare Other | Admitting: Internal Medicine

## 2020-10-09 VITALS — BP 146/78 | HR 84 | Temp 98.1°F | Ht 69.0 in | Wt 179.0 lb

## 2020-10-09 DIAGNOSIS — E559 Vitamin D deficiency, unspecified: Secondary | ICD-10-CM

## 2020-10-09 DIAGNOSIS — J309 Allergic rhinitis, unspecified: Secondary | ICD-10-CM

## 2020-10-09 DIAGNOSIS — I10 Essential (primary) hypertension: Secondary | ICD-10-CM

## 2020-10-09 DIAGNOSIS — E538 Deficiency of other specified B group vitamins: Secondary | ICD-10-CM

## 2020-10-09 DIAGNOSIS — E039 Hypothyroidism, unspecified: Secondary | ICD-10-CM

## 2020-10-09 DIAGNOSIS — N183 Chronic kidney disease, stage 3 unspecified: Secondary | ICD-10-CM

## 2020-10-09 DIAGNOSIS — R7309 Other abnormal glucose: Secondary | ICD-10-CM

## 2020-10-09 DIAGNOSIS — N1831 Chronic kidney disease, stage 3a: Secondary | ICD-10-CM

## 2020-10-09 DIAGNOSIS — Z0001 Encounter for general adult medical examination with abnormal findings: Secondary | ICD-10-CM

## 2020-10-09 LAB — CBC WITH DIFFERENTIAL/PLATELET
Basophils Absolute: 0 10*3/uL (ref 0.0–0.1)
Basophils Relative: 0.5 % (ref 0.0–3.0)
Eosinophils Absolute: 0.1 10*3/uL (ref 0.0–0.7)
Eosinophils Relative: 2.1 % (ref 0.0–5.0)
HCT: 30.1 % — ABNORMAL LOW (ref 39.0–52.0)
Hemoglobin: 10.2 g/dL — ABNORMAL LOW (ref 13.0–17.0)
Lymphocytes Relative: 28.8 % (ref 12.0–46.0)
Lymphs Abs: 1.1 10*3/uL (ref 0.7–4.0)
MCHC: 33.8 g/dL (ref 30.0–36.0)
MCV: 94.9 fl (ref 78.0–100.0)
Monocytes Absolute: 0.5 10*3/uL (ref 0.1–1.0)
Monocytes Relative: 12.2 % — ABNORMAL HIGH (ref 3.0–12.0)
Neutro Abs: 2.1 10*3/uL (ref 1.4–7.7)
Neutrophils Relative %: 56.4 % (ref 43.0–77.0)
Platelets: 175 10*3/uL (ref 150.0–400.0)
RBC: 3.17 Mil/uL — ABNORMAL LOW (ref 4.22–5.81)
RDW: 14 % (ref 11.5–15.5)
WBC: 3.7 10*3/uL — ABNORMAL LOW (ref 4.0–10.5)

## 2020-10-09 LAB — LIPID PANEL
Cholesterol: 145 mg/dL (ref 0–200)
HDL: 60 mg/dL (ref >39.00)
LDL Cholesterol: 67 mg/dL (ref 0–99)
NonHDL: 84.62
Total CHOL/HDL Ratio: 2
Triglycerides: 88 mg/dL (ref 0.0–149.0)
VLDL: 17.6 mg/dL (ref 0.0–40.0)

## 2020-10-09 LAB — URINALYSIS, ROUTINE W REFLEX MICROSCOPIC
Bilirubin Urine: NEGATIVE
Hgb urine dipstick: NEGATIVE
Ketones, ur: NEGATIVE
Leukocytes,Ua: NEGATIVE
Nitrite: NEGATIVE
RBC / HPF: NONE SEEN (ref 0–?)
Specific Gravity, Urine: 1.02 (ref 1.000–1.030)
Total Protein, Urine: NEGATIVE
Urine Glucose: NEGATIVE
Urobilinogen, UA: 0.2 (ref 0.0–1.0)
WBC, UA: NONE SEEN (ref 0–?)
pH: 6 (ref 5.0–8.0)

## 2020-10-09 LAB — BASIC METABOLIC PANEL
BUN: 32 mg/dL — ABNORMAL HIGH (ref 6–23)
CO2: 22 mEq/L (ref 19–32)
Calcium: 9.1 mg/dL (ref 8.4–10.5)
Chloride: 104 mEq/L (ref 96–112)
Creatinine, Ser: 2.57 mg/dL — ABNORMAL HIGH (ref 0.40–1.50)
GFR: 22.12 mL/min — ABNORMAL LOW (ref >60.00)
Glucose, Bld: 113 mg/dL — ABNORMAL HIGH (ref 70–99)
Potassium: 4 mEq/L (ref 3.5–5.1)
Sodium: 137 mEq/L (ref 135–145)

## 2020-10-09 LAB — HEPATIC FUNCTION PANEL
ALT: 16 U/L (ref 0–53)
AST: 20 U/L (ref 0–37)
Albumin: 4.1 g/dL (ref 3.5–5.2)
Alkaline Phosphatase: 46 U/L (ref 39–117)
Bilirubin, Direct: 0.1 mg/dL (ref 0.0–0.3)
Total Bilirubin: 0.3 mg/dL (ref 0.2–1.2)
Total Protein: 6.9 g/dL (ref 6.0–8.3)

## 2020-10-09 LAB — MICROALBUMIN / CREATININE URINE RATIO
Creatinine,U: 108.3 mg/dL
Microalb Creat Ratio: 0.8 mg/g (ref 0.0–30.0)
Microalb, Ur: 0.9 mg/dL (ref 0.0–1.9)

## 2020-10-09 LAB — TSH: TSH: 2.46 u[IU]/mL (ref 0.35–4.50)

## 2020-10-09 LAB — VITAMIN D 25 HYDROXY (VIT D DEFICIENCY, FRACTURES): VITD: 58.8 ng/mL (ref 30.00–100.00)

## 2020-10-09 LAB — HEMOGLOBIN A1C: Hgb A1c MFr Bld: 6.3 % (ref 4.6–6.5)

## 2020-10-09 LAB — PHOSPHORUS: Phosphorus: 3.9 mg/dL (ref 2.3–4.6)

## 2020-10-09 LAB — VITAMIN B12: Vitamin B-12: 1091 pg/mL — ABNORMAL HIGH (ref 211–911)

## 2020-10-09 MED ORDER — TRIAMCINOLONE ACETONIDE 55 MCG/ACT NA AERO: 2.0000 | INHALATION_SPRAY | Freq: Every day | NASAL | 12 refills | Status: DC

## 2020-10-09 MED ORDER — NEBIVOLOL HCL 10 MG PO TABS: 10.0000 mg | ORAL_TABLET | Freq: Every day | ORAL | 3 refills | Status: DC

## 2020-10-09 MED ORDER — FEXOFENADINE HCL 180 MG PO TABS: 180.0000 mg | ORAL_TABLET | Freq: Every day | ORAL | 3 refills | Status: DC
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
## 2020-10-09 NOTE — Patient Instructions (Signed)
Please take OTC Allegra and Nasacort for allergies  Ok to stop the labetolol  Please take all new medication as prescribed - the bystolic 10 mg per day for blood pressure  Please continue all other medications as before, and refills have been done if requested.  Please have the pharmacy call with any other refills you may need.  Please continue your efforts at being more active, low cholesterol diet, and weight control.  You are otherwise up to date with prevention measures today.  Please keep your appointments with your specialists as you may have planned  Please go to the LAB at the blood drawing area for the tests to be done  You will be contacted by phone if any changes need to be made immediately.  Otherwise, you will receive a letter about your results with an explanation, but please check with MyChart first.  Please remember to sign up for MyChart if you have not done so, as this will be important to you in the future with finding out test results, communicating by private email, and scheduling acute appointments online when needed.  Please make an Appointment to return in 1 months, or sooner if needed

## 2020-10-11 ENCOUNTER — Encounter: Payer: Self-pay | Admitting: Internal Medicine

## 2020-10-11 ENCOUNTER — Other Ambulatory Visit: Payer: Self-pay | Admitting: Family

## 2020-10-12 ENCOUNTER — Encounter: Payer: Self-pay | Admitting: Internal Medicine

## 2020-10-12 DIAGNOSIS — J309 Allergic rhinitis, unspecified: Secondary | ICD-10-CM | POA: Insufficient documentation

## 2020-10-12 NOTE — Assessment & Plan Note (Addendum)
Lab Results  Component Value Date   CREATININE 2.57 (H) 10/09/2020   Stable overall, cont to avoid nephrotoxins, and f/u renal as planned

## 2020-10-12 NOTE — Assessment & Plan Note (Signed)
Last vitamin D Lab Results  Component Value Date   VD25OH 58.80 10/09/2020   Stable, cont oral replacement

## 2020-10-12 NOTE — Assessment & Plan Note (Signed)
D/w pt, for otc allegra and nasacort asd,  to f/u any worsening symptoms or concerns

## 2020-10-12 NOTE — Assessment & Plan Note (Signed)
With possible overcontrol and dizziness, none today here, ok for change labetolol to bystolic 10 qd, cont to f/u BP at home and next visit

## 2020-10-12 NOTE — Assessment & Plan Note (Signed)

## 2020-10-12 NOTE — Assessment & Plan Note (Signed)
Lab Results  Component Value Date   TSH 2.46 10/09/2020   Stable, pt to continue levothyroxine

## 2020-10-12 NOTE — Assessment & Plan Note (Signed)
Cbg 106 this am per pt;  Lab Results  Component Value Date   HGBA1C 6.3 10/09/2020   Stable, pt to continue current medical treatment  - diet  Current Outpatient Medications (Endocrine & Metabolic):  .  levothyroxine (SYNTHROID) 50 MCG tablet, TAKE 1 TABLET BY MOUTH  DAILY BEFORE BREAKFAST  Current Outpatient Medications (Cardiovascular):  .  amLODipine (NORVASC) 5 MG tablet, TAKE 1 TABLET(5 MG) BY MOUTH DAILY .  nebivolol (BYSTOLIC) 10 MG tablet, Take 1 tablet (10 mg total) by mouth daily. .  simvastatin (ZOCOR) 40 MG tablet, TAKE 1 TABLET BY MOUTH AT  BEDTIME  Current Outpatient Medications (Respiratory):  .  fexofenadine (ALLEGRA) 180 MG tablet, Take 1 tablet (180 mg total) by mouth daily. [name]  triamcinolone (NASACORT) 55 MCG/ACT AERO nasal inhaler, Place 2 sprays into the nose daily.  Current Outpatient Medications (Analgesics):  .  aspirin EC 81 MG tablet, Take 81 mg by mouth daily.   Current Outpatient Medications (Other):  [name]  ACCU-CHEK GUIDE test strip, USE DAILY AS NEEDED .  Apoaequorin (PREVAGEN PO), Take by mouth daily. .  Blood Glucose Monitoring Suppl (ACCU-CHEK GUIDE) w/Device KIT, USE AS DIRECTED. .  Lancets (ACCU-CHEK SOFT TOUCH) lancets, 1 each by Other route daily as needed for other. Dx: R73.09 .  Multiple Vitamins-Minerals (CENTRUM PO), Take 1 tablet by mouth daily. .  pantoprazole (PROTONIX) 40 MG tablet, TAKE 1 TABLET BY MOUTH  DAILY .  tamsulosin (FLOMAX) 0.4 MG CAPS capsule, TAKE 1 CAPSULE BY MOUTH  DAILY

## 2020-10-13 ENCOUNTER — Telehealth: Payer: Self-pay | Admitting: Internal Medicine

## 2020-10-13 LAB — PTH, INTACT AND CALCIUM
Calcium: 9 mg/dL (ref 8.6–10.3)
PTH: 65 pg/mL — ABNORMAL HIGH (ref 14–64)

## 2020-10-13 NOTE — Telephone Encounter (Signed)
Patient called and was wondering if something else could be called in in replacement for amLODipine (NORVASC) 5 MG tablet. He said that it was too expensive after insurance. Please advise.    Parker, [place]

## 2020-10-14 ENCOUNTER — Other Ambulatory Visit: Payer: Self-pay | Admitting: Internal Medicine

## 2020-10-14 NOTE — Telephone Encounter (Signed)
This medication is $9 for 30 pills, or $24 for 90 pills  Can I send a rx to [facility] for him?

## 2020-10-15 NOTE — Telephone Encounter (Signed)
Please refill as per office routine med refill policy (all routine meds refilled for 3 mo or monthly per pt preference up to one year from last visit, then month to month grace period for 3 mo, then further med refills will have to be denied)  

## 2020-10-16 ENCOUNTER — Other Ambulatory Visit: Payer: Self-pay | Admitting: Internal Medicine

## 2020-10-20 ENCOUNTER — Telehealth: Payer: Self-pay

## 2020-10-20 NOTE — Telephone Encounter (Signed)
Pt notified about having refill for amplodpine sent to [facility]

## 2020-10-20 NOTE — Telephone Encounter (Signed)
Left pt voicemail in regards to prescription cost

## 2020-10-20 NOTE — Telephone Encounter (Signed)
° ° °  Please return call to patient °

## 2020-11-10 ENCOUNTER — Other Ambulatory Visit: Payer: Self-pay

## 2020-11-10 ENCOUNTER — Encounter: Payer: Self-pay | Admitting: Internal Medicine

## 2020-11-10 ENCOUNTER — Ambulatory Visit (INDEPENDENT_AMBULATORY_CARE_PROVIDER_SITE_OTHER): Payer: Medicare Other | Admitting: Internal Medicine

## 2020-11-10 VITALS — BP 138/64 | HR 74 | Temp 98.1°F | Ht 69.0 in | Wt 182.0 lb

## 2020-11-10 DIAGNOSIS — I10 Essential (primary) hypertension: Secondary | ICD-10-CM

## 2020-11-10 DIAGNOSIS — I700 Atherosclerosis of aorta: Secondary | ICD-10-CM

## 2020-11-10 DIAGNOSIS — R7309 Other abnormal glucose: Secondary | ICD-10-CM | POA: Diagnosis not present

## 2020-11-10 DIAGNOSIS — J309 Allergic rhinitis, unspecified: Secondary | ICD-10-CM

## 2020-11-10 DIAGNOSIS — N184 Chronic kidney disease, stage 4 (severe): Secondary | ICD-10-CM | POA: Insufficient documentation

## 2020-11-10 NOTE — Patient Instructions (Signed)
Your Blood Pressure is improved today  Ok to start the Nasacort for the nasal allergies  Please continue all other medications as before, and refills have been done if requested.  Please have the pharmacy call with any other refills you may need.  Please continue your efforts at being more active, low cholesterol diet, and weight control.  Please keep your appointments with your specialists as you may have planned  You will be contacted regarding the referral for: [facility], though you may wish to contact them yourself as you may already have an appointment for this year  Please make an Appointment to return in 6 months, or sooner if needed

## 2020-11-10 NOTE — Assessment & Plan Note (Signed)
?   Mild worsening, for renal referral, avoid nephrotoxins

## 2020-11-10 NOTE — Assessment & Plan Note (Signed)
Pt to cont low chol diet, and zocor 40 mg qd

## 2020-11-10 NOTE — Assessment & Plan Note (Signed)
BP Readings from Last 3 Encounters:  11/10/20 138/64  10/09/20 (!) 146/78  08/28/20 (!) 160/88   Stable, pt to continue medical treatment  - bystolic, norvasc

## 2020-11-10 NOTE — Assessment & Plan Note (Signed)
Uncontrolled, encouraged pt start the nasacort as was mentioned at last visit as well,  to f/u any worsening symptoms or concerns
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
## 2020-11-10 NOTE — Assessment & Plan Note (Signed)
Lab Results  Component Value Date   HGBA1C 6.3 10/09/2020   Stable, pt to continue current medical treatment  - diet and wt control

## 2020-11-14 ENCOUNTER — Telehealth: Payer: Self-pay | Admitting: Internal Medicine

## 2020-11-14 NOTE — Telephone Encounter (Signed)
Patient states during his visit on 03.14.22 Dr. Jenny Reichmann told him he didn't have to take one of his medications as often as he was and he doesn't remember what it was.  [phone]

## 2020-11-14 NOTE — Telephone Encounter (Signed)
Pt notified to take B12 3* weekly

## 2020-11-14 NOTE — Telephone Encounter (Signed)
Ok for him to take the B12 supplement 3 times weekly only

## 2020-12-01 DIAGNOSIS — D472 Monoclonal gammopathy: Secondary | ICD-10-CM | POA: Diagnosis not present

## 2020-12-01 DIAGNOSIS — N184 Chronic kidney disease, stage 4 (severe): Secondary | ICD-10-CM | POA: Diagnosis not present

## 2020-12-01 DIAGNOSIS — D631 Anemia in chronic kidney disease: Secondary | ICD-10-CM | POA: Diagnosis not present

## 2020-12-01 DIAGNOSIS — I5030 Unspecified diastolic (congestive) heart failure: Secondary | ICD-10-CM | POA: Diagnosis not present

## 2020-12-01 DIAGNOSIS — I129 Hypertensive chronic kidney disease with stage 1 through stage 4 chronic kidney disease, or unspecified chronic kidney disease: Secondary | ICD-10-CM | POA: Diagnosis not present

## 2021-01-06 ENCOUNTER — Other Ambulatory Visit: Payer: Self-pay | Admitting: Internal Medicine

## 2021-01-06 NOTE — Telephone Encounter (Signed)
Please refill as per office routine med refill policy (all routine meds refilled for 3 mo or monthly per pt preference up to one year from last visit, then month to month grace period for 3 mo, then further med refills will have to be denied)  

## 2021-01-12 ENCOUNTER — Telehealth: Payer: Self-pay | Admitting: Internal Medicine

## 2021-01-12 MED ORDER — AMLODIPINE BESYLATE 5 MG PO TABS: ORAL_TABLET | ORAL | 1 refills | Status: DC

## 2021-01-12 NOTE — Telephone Encounter (Signed)
amLODipine (NORVASC) 5 MG tablet Patient states a 90 day supply was over 140 dollars and he cannot afford that

## 2021-01-12 NOTE — Telephone Encounter (Signed)
Please ask pt to clarify as no else pays this amount at [facility], and this prescriptoin is$10 for 3 mo supply at [place]  I suspect he may this medication confused with another that he was told was $140; if he find this out, just let us know

## 2021-01-14 NOTE — Telephone Encounter (Signed)
Spoke with patient and got clarification. Cost of prescription was expensive with [facility]. Advised patient that new prescription has been sent to mail order pharmacy and should be cheaper. Patient verbalizes understanding.

## 2021-01-23 ENCOUNTER — Other Ambulatory Visit: Payer: Self-pay

## 2021-01-23 ENCOUNTER — Telehealth: Payer: Self-pay | Admitting: Internal Medicine

## 2021-01-23 DIAGNOSIS — I10 Essential (primary) hypertension: Secondary | ICD-10-CM

## 2021-01-23 MED ORDER — ACCU-CHEK GUIDE W/DEVICE KIT: PACK | 0 refills | Status: AC

## 2021-01-23 NOTE — Telephone Encounter (Signed)
1.Medication Requested:  Blood Glucose Monitoring Suppl (ACCU-CHEK GUIDE) w/Device KIT  Blood pressure monitoring suppl  2. Pharmacy (Name, Street, [place]): [facility], [address] Phone:  [phone]  Fax:  [phone]      3. On Med List: Y/N  4. Last Visit with PCP: 11/10/2020  5. Next visit date with PCP: 05/11/2021   Agent: Please be advised that RX refills may take up to 3 business days. We ask that you follow-up with your pharmacy.

## 2021-02-05 NOTE — Telephone Encounter (Signed)
   Patient requesting order for blood pressure monitor

## 2021-02-05 NOTE — Addendum Note (Signed)
Addended by: Biagio Borg on: 02/05/2021 05:01 PM   Modules accepted: Orders

## 2021-02-05 NOTE — Telephone Encounter (Signed)
Done hardcopy to s summ[facility] 

## 2021-02-06 ENCOUNTER — Telehealth: Payer: Self-pay | Admitting: Internal Medicine

## 2021-02-06 MED ORDER — ACCU-CHEK GUIDE VI STRP: ORAL_STRIP | 2 refills | Status: DC

## 2021-02-06 NOTE — Telephone Encounter (Signed)
Left voicemail that form was ready for pickup

## 2021-02-06 NOTE — Telephone Encounter (Signed)
   Patient requesting   [place], [place] AT [place]

## 2021-02-11 ENCOUNTER — Other Ambulatory Visit: Payer: Self-pay

## 2021-02-11 ENCOUNTER — Telehealth: Payer: Self-pay | Admitting: Internal Medicine

## 2021-02-11 NOTE — Telephone Encounter (Signed)
Patient said that nebivolol (BYSTOLIC) 10 MG tablet would be $117 for 90 day supply. He said that [facility] told him that if someone can call them that there is something they can do to lower the price of the medicaiton to $15 for a 90 day supply. Please advise     [phone]

## 2021-02-13 ENCOUNTER — Other Ambulatory Visit: Payer: Self-pay

## 2021-02-13 MED ORDER — NEBIVOLOL HCL 10 MG PO TABS: 10.0000 mg | ORAL_TABLET | Freq: Every day | ORAL | 0 refills | Status: DC

## 2021-02-13 MED ORDER — NEBIVOLOL HCL 10 MG PO TABS: 10.0000 mg | ORAL_TABLET | Freq: Every day | ORAL | 3 refills | Status: DC

## 2021-02-13 NOTE — Telephone Encounter (Signed)
Patient called again and said that he took his last pill this morning

## 2021-02-13 NOTE — Telephone Encounter (Signed)
Ok sure can we send 1 mo to local pharmacy

## 2021-02-13 NOTE — Telephone Encounter (Signed)
Followed up with [facility] and the bystolic cp pay will be $0  Also left the information on patients voicemail.

## 2021-02-16 ENCOUNTER — Other Ambulatory Visit: Payer: Self-pay

## 2021-02-17 ENCOUNTER — Ambulatory Visit: Payer: Medicare Other | Admitting: Internal Medicine

## 2021-03-13 ENCOUNTER — Telehealth: Payer: Self-pay | Admitting: Internal Medicine

## 2021-03-13 ENCOUNTER — Other Ambulatory Visit: Payer: Self-pay

## 2021-03-13 MED ORDER — NEBIVOLOL HCL 10 MG PO TABS: 10.0000 mg | ORAL_TABLET | Freq: Every day | ORAL | 0 refills | Status: DC

## 2021-03-13 NOTE — Telephone Encounter (Signed)
   Patient requesting refill for nebivolol (BYSTOLIC) 10 MG tablet  Pharmacy [facility]  ([place], [place]

## 2021-03-15 ENCOUNTER — Other Ambulatory Visit: Payer: Self-pay | Admitting: Internal Medicine

## 2021-03-16 MED ORDER — NEBIVOLOL HCL 10 MG PO TABS: 10.0000 mg | ORAL_TABLET | Freq: Every day | ORAL | 0 refills | Status: DC

## 2021-03-16 NOTE — Telephone Encounter (Signed)
Follow up message  Please send  Bystolic to local pharmacy while waiting on mail order. Patient has no medication remaining   [place], [place] [address]

## 2021-03-16 NOTE — Telephone Encounter (Signed)
Prescription has been sent to local pharmacy and patient notified via voicemail.

## 2021-03-23 IMAGING — DX DG CHEST 2V
2 series · 2 of 2 positions shown · non-contrast
Comparison: November 01, 2019

CLINICAL DATA: Productive cough

EXAM:
CHEST - 2 VIEW

[chest pa]
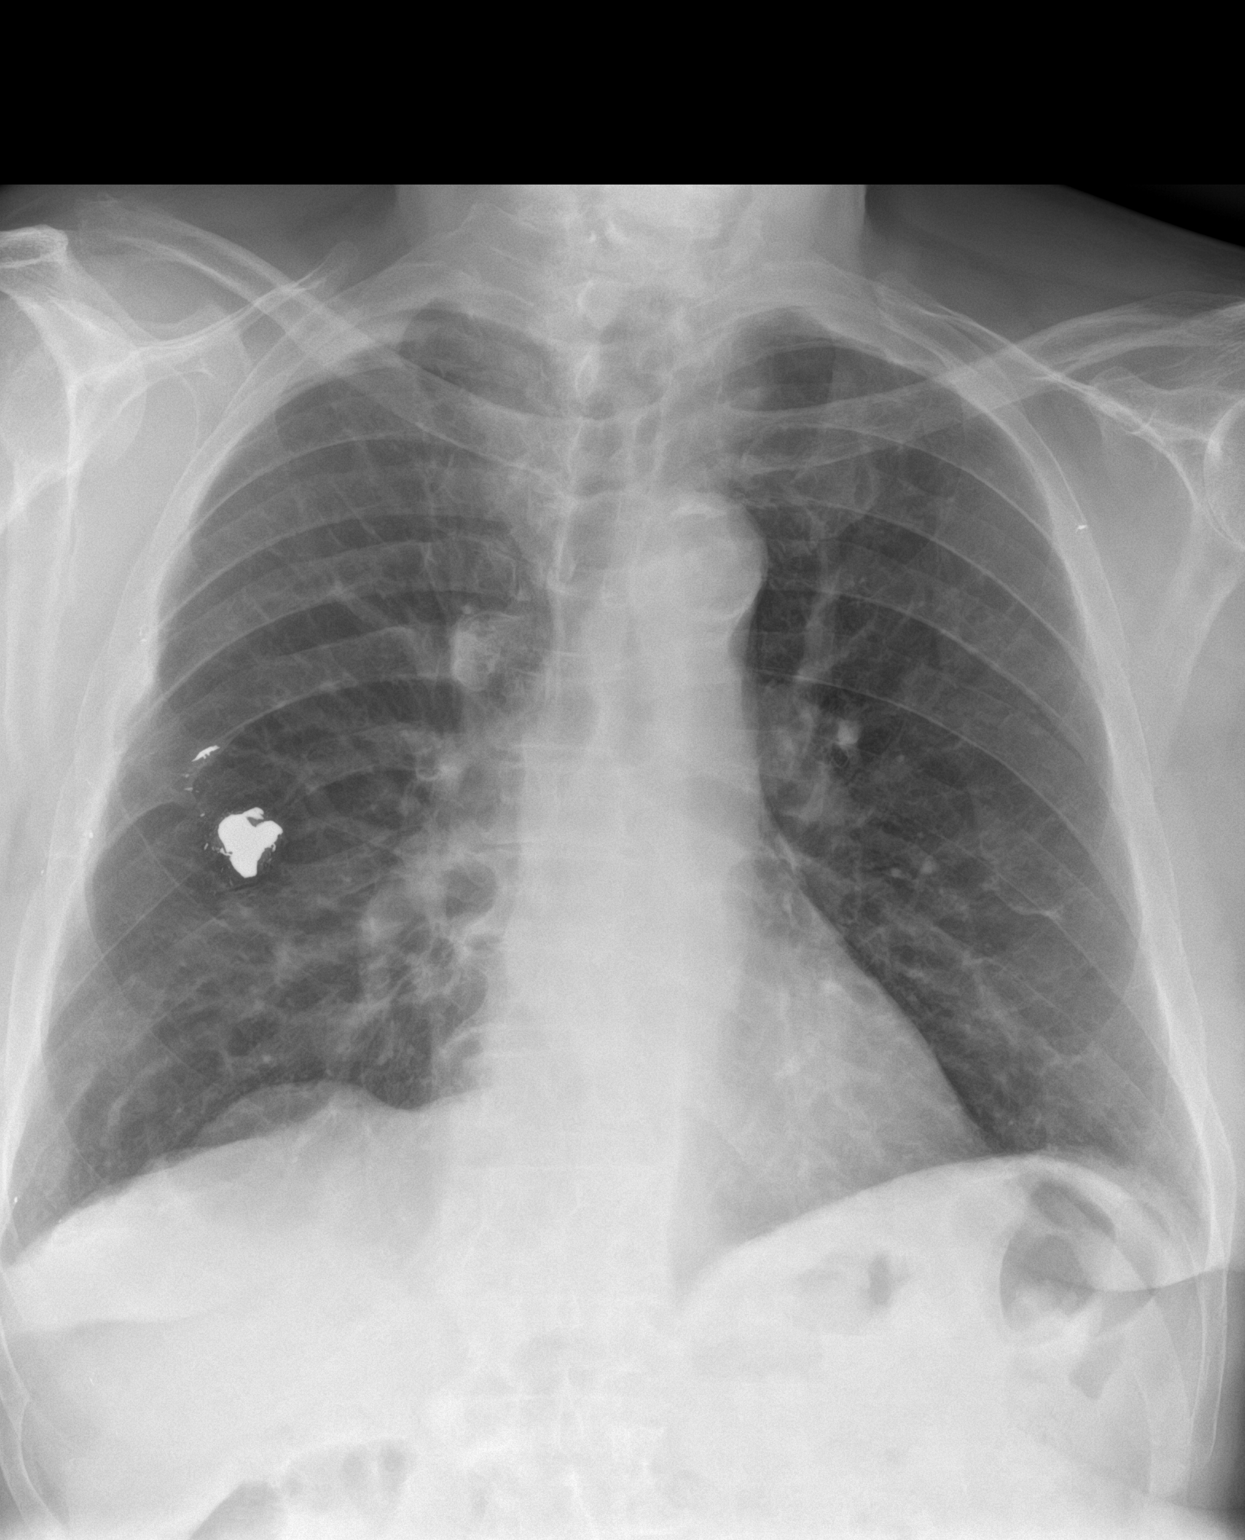

[chest lat]
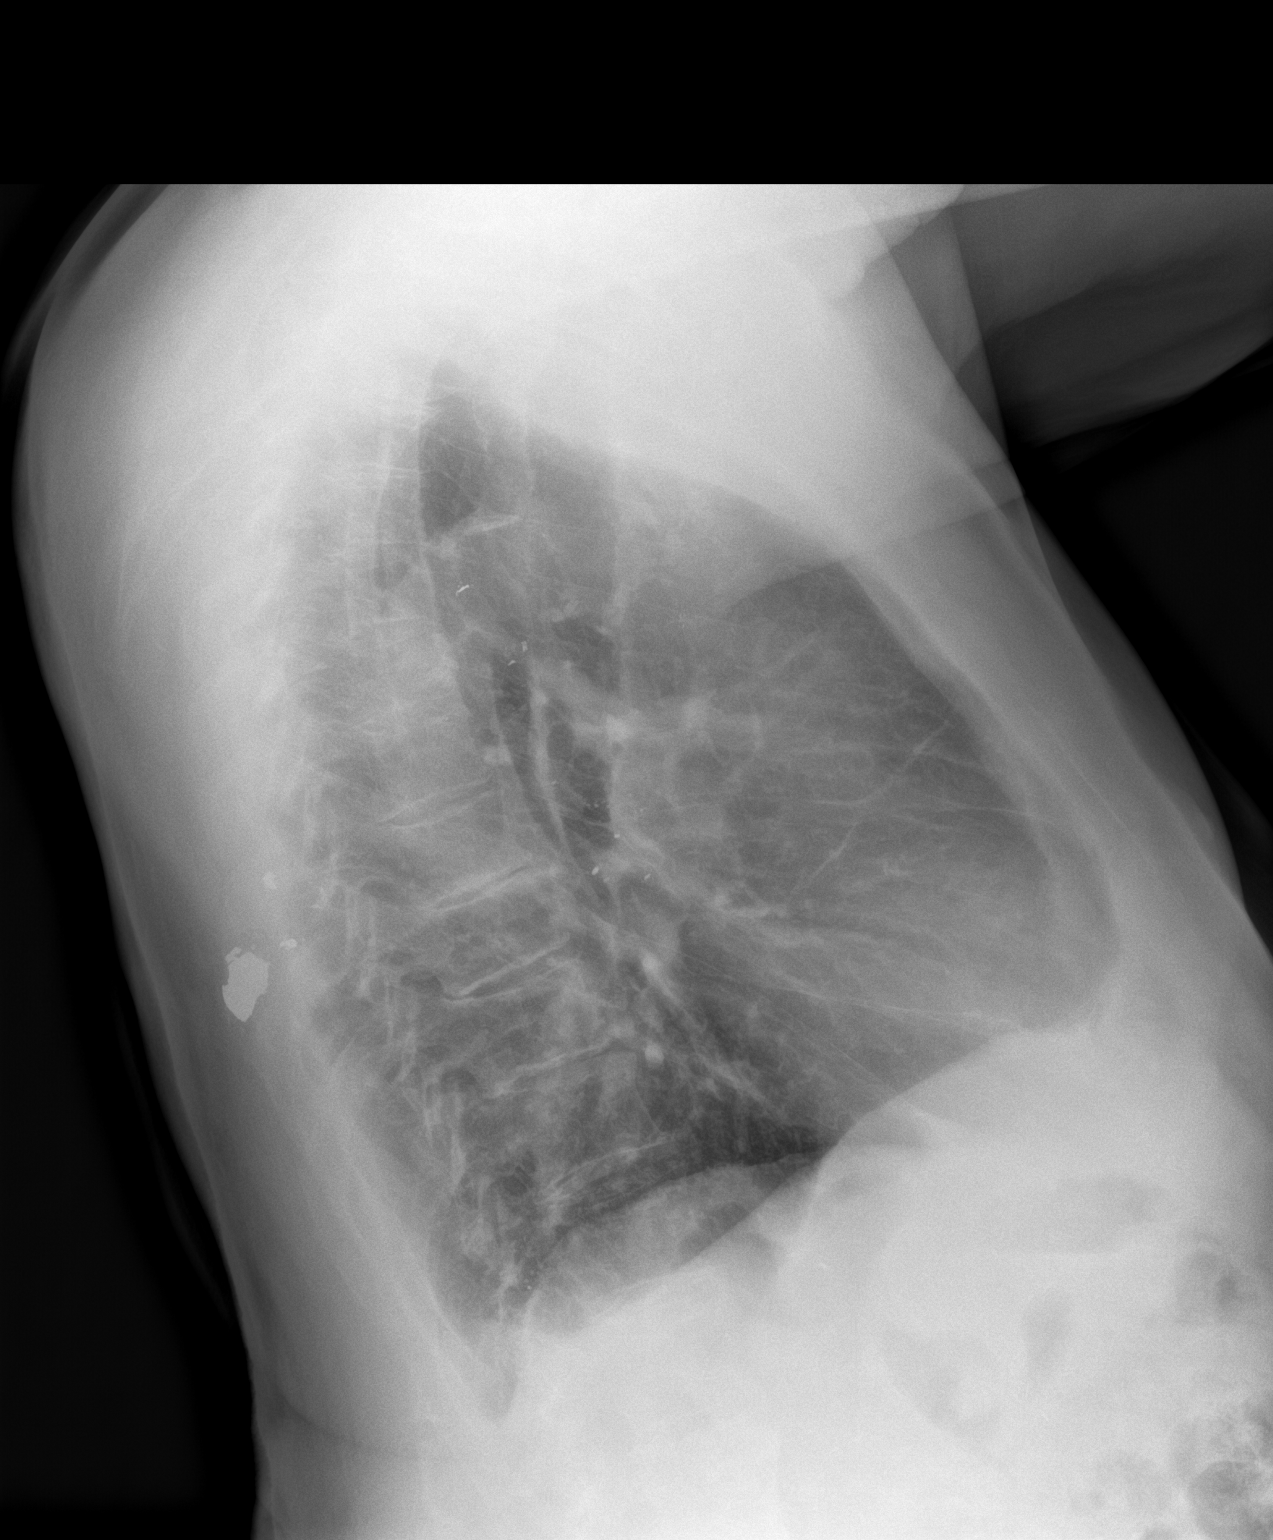

[2 of 2 positions shown; findings below may reference images not displayed]

FINDINGS: The metallic foreign body again projects in the posterior soft
tissues overlying the right hemithorax. There is no focal infiltrate
or pneumothorax. No large pleural effusion. Aortic calcifications
are noted. The heart size is stable. There are degenerative changes
of the thoracic spine.
IMPRESSION: No active cardiopulmonary disease.

## 2021-04-17 ENCOUNTER — Telehealth: Payer: Self-pay | Admitting: Internal Medicine

## 2021-04-17 NOTE — Telephone Encounter (Signed)
Fortunately this medication is also OTC , and pt can also take OTC claritin 10 mg qd prn instead of needed

## 2021-04-17 NOTE — Telephone Encounter (Signed)
Pt has to pay out of pocket for medications listed below and it is out of his budget. Requesting a cheaper alternative.   fexofenadine (ALLEGRA) 180 MG tablet, nebivolol (BYSTOLIC) 10 MG tablet

## 2021-04-20 MED ORDER — NEBIVOLOL HCL 10 MG PO TABS: 10.0000 mg | ORAL_TABLET | Freq: Every day | ORAL | 1 refills | Status: DC

## 2021-04-20 MED ORDER — FEXOFENADINE HCL 180 MG PO TABS: 180.0000 mg | ORAL_TABLET | Freq: Every day | ORAL | 1 refills | Status: DC

## 2021-04-20 NOTE — Telephone Encounter (Signed)
Pt states he gets other medications for cheaper at [facility] would prefer to have them refilled there vs [facility].

## 2021-04-23 ENCOUNTER — Telehealth: Payer: Self-pay

## 2021-04-23 MED ORDER — AMLODIPINE BESYLATE 5 MG PO TABS: ORAL_TABLET | ORAL | 1 refills | Status: DC

## 2021-04-23 NOTE — Telephone Encounter (Signed)
Received faxed refill request from [facility] on [address]. Pt noted to have refills remaining, however, last filled through [facility]. Will need to confirm with pt if preference for medication to go through [facility] per faxed request. LVM instructions for pt to call back.

## 2021-04-23 NOTE — Telephone Encounter (Signed)
Patient called back per previous message  Please send rx to mail order pharmacy:  [facility]  ([place], [place] Phone:  [phone]  Fax:  [phone]

## 2021-04-23 NOTE — Addendum Note (Signed)
Addended by: Elza Rafter D on: 04/23/2021 04:37 PM   Modules accepted: Orders

## 2021-04-29 ENCOUNTER — Telehealth: Payer: Self-pay | Admitting: Internal Medicine

## 2021-04-29 NOTE — Telephone Encounter (Signed)
LVM for pt to rtn my call to [phone] to schedule AWV with [facility]. Please schedule this appt if pt calls the office.

## 2021-05-07 ENCOUNTER — Encounter (INDEPENDENT_AMBULATORY_CARE_PROVIDER_SITE_OTHER): Payer: Medicare Other | Admitting: Ophthalmology

## 2021-05-07 ENCOUNTER — Other Ambulatory Visit: Payer: Self-pay

## 2021-05-07 DIAGNOSIS — H2512 Age-related nuclear cataract, left eye: Secondary | ICD-10-CM

## 2021-05-07 DIAGNOSIS — I10 Essential (primary) hypertension: Secondary | ICD-10-CM

## 2021-05-07 DIAGNOSIS — H35032 Hypertensive retinopathy, left eye: Secondary | ICD-10-CM

## 2021-05-07 DIAGNOSIS — H353122 Nonexudative age-related macular degeneration, left eye, intermediate dry stage: Secondary | ICD-10-CM | POA: Diagnosis not present

## 2021-05-07 DIAGNOSIS — H43812 Vitreous degeneration, left eye: Secondary | ICD-10-CM | POA: Diagnosis not present

## 2021-05-11 ENCOUNTER — Encounter: Payer: Self-pay | Admitting: Internal Medicine

## 2021-05-11 ENCOUNTER — Other Ambulatory Visit: Payer: Self-pay

## 2021-05-11 ENCOUNTER — Ambulatory Visit (INDEPENDENT_AMBULATORY_CARE_PROVIDER_SITE_OTHER): Payer: Medicare Other

## 2021-05-11 ENCOUNTER — Ambulatory Visit (INDEPENDENT_AMBULATORY_CARE_PROVIDER_SITE_OTHER): Payer: Medicare Other | Admitting: Internal Medicine

## 2021-05-11 VITALS — BP 140/80 | HR 78 | Temp 98.2°F | Resp 16 | Ht 69.0 in | Wt 180.4 lb

## 2021-05-11 VITALS — BP 140/80 | HR 78 | Temp 98.2°F | Ht 69.0 in | Wt 180.4 lb

## 2021-05-11 DIAGNOSIS — Z23 Encounter for immunization: Secondary | ICD-10-CM

## 2021-05-11 DIAGNOSIS — E039 Hypothyroidism, unspecified: Secondary | ICD-10-CM | POA: Diagnosis not present

## 2021-05-11 DIAGNOSIS — E559 Vitamin D deficiency, unspecified: Secondary | ICD-10-CM

## 2021-05-11 DIAGNOSIS — I10 Essential (primary) hypertension: Secondary | ICD-10-CM

## 2021-05-11 DIAGNOSIS — N184 Chronic kidney disease, stage 4 (severe): Secondary | ICD-10-CM | POA: Diagnosis not present

## 2021-05-11 DIAGNOSIS — E7800 Pure hypercholesterolemia, unspecified: Secondary | ICD-10-CM

## 2021-05-11 DIAGNOSIS — R7309 Other abnormal glucose: Secondary | ICD-10-CM

## 2021-05-11 DIAGNOSIS — Z0000 Encounter for general adult medical examination without abnormal findings: Secondary | ICD-10-CM

## 2021-05-11 LAB — BASIC METABOLIC PANEL
BUN: 27 mg/dL — ABNORMAL HIGH (ref 6–23)
CO2: 23 mEq/L (ref 19–32)
Calcium: 9.3 mg/dL (ref 8.4–10.5)
Chloride: 108 mEq/L (ref 96–112)
Creatinine, Ser: 2.14 mg/dL — ABNORMAL HIGH (ref 0.40–1.50)
GFR: 27.45 mL/min — ABNORMAL LOW (ref >60.00)
Glucose, Bld: 107 mg/dL — ABNORMAL HIGH (ref 70–99)
Potassium: 4.6 mEq/L (ref 3.5–5.1)
Sodium: 139 mEq/L (ref 135–145)

## 2021-05-11 LAB — LIPID PANEL
Cholesterol: 140 mg/dL (ref 0–200)
HDL: 57.4 mg/dL (ref >39.00)
LDL Cholesterol: 70 mg/dL (ref 0–99)
NonHDL: 82.66
Total CHOL/HDL Ratio: 2
Triglycerides: 64 mg/dL (ref 0.0–149.0)
VLDL: 12.8 mg/dL (ref 0.0–40.0)

## 2021-05-11 LAB — HEPATIC FUNCTION PANEL
ALT: 15 U/L (ref 0–53)
AST: 18 U/L (ref 0–37)
Albumin: 4.1 g/dL (ref 3.5–5.2)
Alkaline Phosphatase: 44 U/L (ref 39–117)
Bilirubin, Direct: 0.1 mg/dL (ref 0.0–0.3)
Total Bilirubin: 0.4 mg/dL (ref 0.2–1.2)
Total Protein: 6.9 g/dL (ref 6.0–8.3)

## 2021-05-11 LAB — VITAMIN D 25 HYDROXY (VIT D DEFICIENCY, FRACTURES): VITD: 49.18 ng/mL (ref 30.00–100.00)

## 2021-05-11 LAB — HEMOGLOBIN A1C: Hgb A1c MFr Bld: 6.4 % (ref 4.6–6.5)
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
## 2021-05-11 NOTE — Patient Instructions (Addendum)
You had the flu shot today  Please have your son help you get signed up ONLINE for "[place]" since the amlodipine 5 mg for 90 days is about $5.00, and the Nebivolol 10 mg for 90 days is only about $11.00  Please continue all other medications as before, and refills have been done if requested.  Please have the pharmacy call with any other refills you may need.  Please continue your efforts at being more active, low cholesterol diet, and weight control.  Please keep your appointments with your specialists as you may have planned  Please go to the LAB at the blood drawing area for the tests to be done  You will be contacted by phone if any changes need to be made immediately.  Otherwise, you will receive a letter about your results with an explanation, but please check with MyChart first.  Please remember to sign up for MyChart if you have not done so, as this will be important to you in the future with finding out test results, communicating by private email, and scheduling acute appointments online when needed.  Please make an Appointment to return in 6 months, or sooner if needed

## 2021-05-11 NOTE — Patient Instructions (Signed)
Greg [name] , Thank you for taking time to come for your Medicare Wellness Visit. I appreciate your ongoing commitment to your health goals. Please review the following plan we discussed and let me know if I can assist you in the future.   Screening recommendations/referrals: Colonoscopy: Not a candidate for screening due to age Recommended yearly ophthalmology/optometry visit for glaucoma screening and checkup Recommended yearly dental visit for hygiene and checkup  Vaccinations: Influenza vaccine: 06/28/2020 Pneumococcal vaccine: 07/04/2009, 11/11/2016 Tdap vaccine: 01/18/2012; due every 10 years Shingles vaccine: never done ; can check with local pharmacy  Covid-19: 09/21/2019, 10/12/2019, 07/14/2020, 01/21/2021  Advanced directives: Please bring a copy of your health care power of attorney and living will to the office at your convenience.  Conditions/risks identified: Yes; Client understands the importance of follow-up with providers by attending scheduled visits and discussed goals to eat healthier, increase physical activity, exercise the brain, socialize more, get enough sleep and make time for laughter.  Next appointment: Please schedule your next Medicare Wellness Visit with your Nurse Health Advisor in 1 year by calling [phone].  Preventive Care 85 Years and Older, Male Preventive care refers to lifestyle choices and visits with your health care provider that can promote health and wellness. What does preventive care include? A yearly physical exam. This is also called an annual well check. Dental exams once or twice a year. Routine eye exams. Ask your health care provider how often you should have your eyes checked. Personal lifestyle choices, including: Daily care of your teeth and gums. Regular physical activity. Eating a healthy diet. Avoiding tobacco and drug use. Limiting alcohol use. Practicing safe sex. Taking low doses of aspirin every day. Taking vitamin and  mineral supplements as recommended by your health care provider. What happens during an annual well check? The services and screenings done by your health care provider during your annual well check will depend on your age, overall health, lifestyle risk factors, and family history of disease. Counseling  Your health care provider may ask you questions about your: Alcohol use. Tobacco use. Drug use. Emotional well-being. Home and relationship well-being. Sexual activity. Eating habits. History of falls. Memory and ability to understand (cognition). Work and work Statistician. Screening  You may have the following tests or measurements: Height, weight, and BMI. Blood pressure. Lipid and cholesterol levels. These may be checked every 5 years, or more frequently if you are over 49 years old. Skin check. Lung cancer screening. You may have this screening every year starting at age 45 if you have a 30-pack-year history of smoking and currently smoke or have quit within the past 15 years. Fecal occult blood test (FOBT) of the stool. You may have this test every year starting at age 34. Flexible sigmoidoscopy or colonoscopy. You may have a sigmoidoscopy every 5 years or a colonoscopy every 10 years starting at age 68. Prostate cancer screening. Recommendations will vary depending on your family history and other risks. Hepatitis C blood test. Hepatitis B blood test. Sexually transmitted disease (STD) testing. Diabetes screening. This is done by checking your blood sugar (glucose) after you have not eaten for a while (fasting). You may have this done every 1-3 years. Abdominal aortic aneurysm (AAA) screening. You may need this if you are a current or former smoker. Osteoporosis. You may be screened starting at age 19 if you are at high risk. Talk with your health care provider about your test results, treatment options, and if necessary, the need for more  tests. Vaccines  Your health care  provider may recommend certain vaccines, such as: Influenza vaccine. This is recommended every year. Tetanus, diphtheria, and acellular pertussis (Tdap, Td) vaccine. You may need a Td booster every 10 years. Zoster vaccine. You may need this after age 25. Pneumococcal 13-valent conjugate (PCV13) vaccine. One dose is recommended after age 86. Pneumococcal polysaccharide (PPSV23) vaccine. One dose is recommended after age 72. Talk to your health care provider about which screenings and vaccines you need and how often you need them. This information is not intended to replace advice given to you by your health care provider. Make sure you discuss any questions you have with your health care provider. Document Released: 09/12/2015 Document Revised: 05/05/2016 Document Reviewed: 06/17/2015 Elsevier Interactive Patient Education  2017 [place] Prevention in the Home Falls can cause injuries. They can happen to people of all ages. There are many things you can do to make your home safe and to help prevent falls. What can I do on the outside of my home? Regularly fix the edges of walkways and driveways and fix any cracks. Remove anything that might make you trip as you walk through a door, such as a raised step or threshold. Trim any bushes or trees on the path to your home. Use bright outdoor lighting. Clear any walking paths of anything that might make someone trip, such as rocks or tools. Regularly check to see if handrails are loose or broken. Make sure that both sides of any steps have handrails. Any raised decks and porches should have guardrails on the edges. Have any leaves, snow, or ice cleared regularly. Use sand or salt on walking paths during winter. Clean up any spills in your garage right away. This includes oil or grease spills. What can I do in the bathroom? Use night lights. Install grab bars by the toilet and in the tub and shower. Do not use towel bars as grab  bars. Use non-skid mats or decals in the tub or shower. If you need to sit down in the shower, use a plastic, non-slip stool. Keep the floor dry. Clean up any water that spills on the floor as soon as it happens. Remove soap buildup in the tub or shower regularly. Attach bath mats securely with double-sided non-slip rug tape. Do not have throw rugs and other things on the floor that can make you trip. What can I do in the bedroom? Use night lights. Make sure that you have a light by your bed that is easy to reach. Do not use any sheets or blankets that are too big for your bed. They should not hang down onto the floor. Have a firm chair that has side arms. You can use this for support while you get dressed. Do not have throw rugs and other things on the floor that can make you trip. What can I do in the kitchen? Clean up any spills right away. Avoid walking on wet floors. Keep items that you use a lot in easy-to-reach places. If you need to reach something above you, use a strong step stool that has a grab bar. Keep electrical cords out of the way. Do not use floor polish or wax that makes floors slippery. If you must use wax, use non-skid floor wax. Do not have throw rugs and other things on the floor that can make you trip. What can I do with my stairs? Do not leave any items on the stairs. Make sure  that there are handrails on both sides of the stairs and use them. Fix handrails that are broken or loose. Make sure that handrails are as long as the stairways. Check any carpeting to make sure that it is firmly attached to the stairs. Fix any carpet that is loose or worn. Avoid having throw rugs at the top or bottom of the stairs. If you do have throw rugs, attach them to the floor with carpet tape. Make sure that you have a light switch at the top of the stairs and the bottom of the stairs. If you do not have them, ask someone to add them for you. What else can I do to help prevent  falls? Wear shoes that: Do not have high heels. Have rubber bottoms. Are comfortable and fit you well. Are closed at the toe. Do not wear sandals. If you use a stepladder: Make sure that it is fully opened. Do not climb a closed stepladder. Make sure that both sides of the stepladder are locked into place. Ask someone to hold it for you, if possible. Clearly mark and make sure that you can see: Any grab bars or handrails. First and last steps. Where the edge of each step is. Use tools that help you move around (mobility aids) if they are needed. These include: Canes. Walkers. Scooters. Crutches. Turn on the lights when you go into a dark area. Replace any light bulbs as soon as they burn out. Set up your furniture so you have a clear path. Avoid moving your furniture around. If any of your floors are uneven, fix them. If there are any pets around you, be aware of where they are. Review your medicines with your doctor. Some medicines can make you feel dizzy. This can increase your chance of falling. Ask your doctor what other things that you can do to help prevent falls. This information is not intended to replace advice given to you by your health care provider. Make sure you discuss any questions you have with your health care provider. Document Released: 06/12/2009 Document Revised: 01/22/2016 Document Reviewed: 09/20/2014 Elsevier Interactive Patient Education  2017 [employer].

## 2021-05-12 ENCOUNTER — Telehealth: Payer: Self-pay

## 2021-05-12 MED ORDER — ZOSTER VAC RECOMB ADJUVANTED 50 MCG/0.5ML IM SUSR: 0.5000 mL | Freq: Once | INTRAMUSCULAR | 1 refills | Status: AC

## 2021-05-12 NOTE — Addendum Note (Signed)
Addended by: Biagio Borg on: 05/12/2021 12:54 PM   Modules accepted: Orders

## 2021-05-12 NOTE — Telephone Encounter (Signed)
Ok done erx ot [employer] on [address]

## 2021-05-12 NOTE — Telephone Encounter (Signed)
Please advise as that the pt has asked that a rx for his shingles vacc be sent into his local pharmacy on file as his insurance will cover the vacc through there.

## 2021-05-17 ENCOUNTER — Encounter: Payer: Self-pay | Admitting: Internal Medicine

## 2021-05-17 NOTE — Assessment & Plan Note (Signed)
Lab Results  Component Value Date   TSH 2.46 10/09/2020   Stable, pt to continue levothyroxine

## 2021-05-17 NOTE — Assessment & Plan Note (Signed)
Last vitamin D Lab Results  Component Value Date   VD25OH 49.18 05/11/2021   Stable, cont oral replacement  

## 2021-05-17 NOTE — Assessment & Plan Note (Signed)
Lab Results  Component Value Date   CREATININE 2.14 (H) 05/11/2021   Stable overall, cont to avoid nephrotoxins

## 2021-05-17 NOTE — Assessment & Plan Note (Signed)
Lab Results  °Component Value Date  ° HGBA1C 6.4 05/11/2021  ° °Stable, pt to continue current medical treatment  - diet ° °

## 2021-05-17 NOTE — Assessment & Plan Note (Signed)
Lab Results  Component Value Date   LDLCALC 70 05/11/2021   Stable, pt to continue current statin zocor

## 2021-05-17 NOTE — Assessment & Plan Note (Addendum)
BP Readings from Last 3 Encounters:  05/11/21 140/80  05/11/21 140/80  11/10/20 138/64   Uncontrolled, pt to continue medical treatment norvasc, bystolic as declines change

## 2021-05-25 ENCOUNTER — Telehealth: Payer: Self-pay | Admitting: Internal Medicine

## 2021-05-25 NOTE — Telephone Encounter (Signed)
See below

## 2021-05-25 NOTE — Telephone Encounter (Signed)
Patient notified and states that this has happened three times. Twice his readings were 91 and then once it was 95.

## 2021-05-25 NOTE — Telephone Encounter (Signed)
I reviewed the chart,and pt is not taking DM medication that I can tell  Ok to continue to monitor, and consider a bedtime snack to help the AM sugars

## 2021-05-25 NOTE — Telephone Encounter (Signed)
Patient calling in concerned about blood sugar.. says he checked it this morning & it was 91 when normally its 110  Wants to know if that's within normal range or if its too low.. please call patient

## 2021-05-25 NOTE — Telephone Encounter (Signed)
This should be ok for now, but he should continue to monitor cbgs in the AM, and if has even 1 or 2 more sugars less than 100 we may have to adjust medication to less med if possible

## 2021-05-26 NOTE — Telephone Encounter (Signed)
Patient notified

## 2021-06-04 ENCOUNTER — Telehealth: Payer: Self-pay | Admitting: Internal Medicine

## 2021-06-04 MED ORDER — AMLODIPINE BESYLATE 5 MG PO TABS: ORAL_TABLET | ORAL | 3 refills | Status: DC

## 2021-06-04 NOTE — Telephone Encounter (Signed)
1.Medication Requested: amLODipine (NORVASC) 5 MG tablet nebivolol (BYSTOLIC) 10 MG tablet   2. Pharmacy (Name, Street, [place]): [facility], [place]  Phone:  [phone] Fax:  [phone]   3. On Med List: yes  4. Last Visit with PCP: 09.12.2022  5. Next visit date with PCP: 03.13.2023   Agent: Please be advised that RX refills may take up to 3 business days. We ask that you follow-up with your pharmacy.

## 2021-06-15 ENCOUNTER — Telehealth: Payer: Self-pay | Admitting: Internal Medicine

## 2021-06-15 MED ORDER — NEBIVOLOL HCL 10 MG PO TABS: 10.0000 mg | ORAL_TABLET | Freq: Every day | ORAL | 0 refills | Status: DC

## 2021-06-15 MED ORDER — NEBIVOLOL HCL 10 MG PO TABS
10.0000 mg | ORAL_TABLET | Freq: Every day | ORAL | 3 refills | Status: DC
Start: 2021-06-15 — End: 2021-09-30

## 2021-06-15 MED ORDER — AMLODIPINE BESYLATE 5 MG PO TABS: ORAL_TABLET | ORAL | 0 refills | Status: DC

## 2021-06-15 NOTE — Telephone Encounter (Signed)
1.Medication Requested:  AmLODipine (NORVASC) 5 MG tablet  Nebivolol (BYSTOLIC) 10 MG tablet   2. Pharmacy (Name, Street, [place]): [facility] [phone] - [address], [address] AT [address]  Phone:  [phone] Fax:  [phone]   3. On Med List: yes  4. Last Visit with PCP: 09.12.22  5. Next visit date with PCP: 03.13.23   Agent: Please be advised that RX refills may take up to 3 business days. We ask that you follow-up with your pharmacy.

## 2021-06-15 NOTE — Addendum Note (Signed)
Addended by: Terence Lux A on: 06/15/2021 02:27 PM   Modules accepted: Orders

## 2021-07-30 ENCOUNTER — Other Ambulatory Visit: Payer: Self-pay | Admitting: Internal Medicine

## 2021-07-30 NOTE — Telephone Encounter (Signed)
Please refill as per office routine med refill policy (all routine meds to be refilled for 3 mo or monthly (per pt preference) up to one year from last visit, then month to month grace period for 3 mo, then further med refills will have to be denied) ? ?

## 2021-07-31 ENCOUNTER — Telehealth: Payer: Self-pay | Admitting: Internal Medicine

## 2021-07-31 MED ORDER — CETIRIZINE HCL 10 MG PO TABS: 10.0000 mg | ORAL_TABLET | Freq: Every day | ORAL | 11 refills | Status: DC

## 2021-07-31 MED ORDER — TRIAMCINOLONE ACETONIDE 55 MCG/ACT NA AERO: 2.0000 | INHALATION_SPRAY | Freq: Every day | NASAL | 12 refills | Status: DC

## 2021-07-31 NOTE — Telephone Encounter (Signed)
Ok this is done 

## 2021-07-31 NOTE — Telephone Encounter (Signed)
Takes allergy medicine. States he has been having a runny nose when eating, and gets phlegm in throat. Not hoarse. Mucus seems to build up more in throat than normal. Requesting a return call from assistant to discuss possible new allergy medicine.     Callback #- [phone]   [facility] [phone] - [address], [address] AT [place] Phone:  [phone]  Fax:  [phone]

## 2021-07-31 NOTE — Telephone Encounter (Signed)
Patient notified and would like prescription for Zyrtec as well

## 2021-07-31 NOTE — Telephone Encounter (Signed)
Ok to add nasacort asd - I will send rx though he may be told it is OTC as well  Ok to continue the allegra since it is probably helping to some degree, and the only one stronger is zyrtec but this can cause sleepiness

## 2021-08-21 ENCOUNTER — Telehealth: Payer: Self-pay

## 2021-08-21 NOTE — Telephone Encounter (Signed)
Pt has called stating he noticed an increase in his hr upon doing activities. He said he can hear his heartbeat in his ears. I also asked about other sxs such as SOB, chest pains, dizziness, light headed or feeling near syncope. Pt denies any of the above sxs. He has stated his rt nostril have bleed a few times this week.  Pt was advised to go to ED or UC if he is feeling worse or notice any changes with his sxs above as he is scheduled to see PCP 08/25/2021 at 8:20am.

## 2021-08-25 ENCOUNTER — Ambulatory Visit (INDEPENDENT_AMBULATORY_CARE_PROVIDER_SITE_OTHER): Payer: Medicare Other

## 2021-08-25 ENCOUNTER — Other Ambulatory Visit: Payer: Self-pay

## 2021-08-25 ENCOUNTER — Encounter: Payer: Self-pay | Admitting: Internal Medicine

## 2021-08-25 ENCOUNTER — Ambulatory Visit (INDEPENDENT_AMBULATORY_CARE_PROVIDER_SITE_OTHER): Payer: Medicare Other | Admitting: Internal Medicine

## 2021-08-25 VITALS — BP 180/70 | HR 71 | Temp 98.2°F | Ht 69.0 in | Wt 185.0 lb

## 2021-08-25 DIAGNOSIS — N184 Chronic kidney disease, stage 4 (severe): Secondary | ICD-10-CM | POA: Diagnosis not present

## 2021-08-25 DIAGNOSIS — I209 Angina pectoris, unspecified: Secondary | ICD-10-CM

## 2021-08-25 DIAGNOSIS — R002 Palpitations: Secondary | ICD-10-CM | POA: Diagnosis not present

## 2021-08-25 DIAGNOSIS — R7309 Other abnormal glucose: Secondary | ICD-10-CM | POA: Diagnosis not present

## 2021-08-25 DIAGNOSIS — I10 Essential (primary) hypertension: Secondary | ICD-10-CM

## 2021-08-25 DIAGNOSIS — E7800 Pure hypercholesterolemia, unspecified: Secondary | ICD-10-CM | POA: Diagnosis not present

## 2021-08-25 LAB — HEMOGLOBIN A1C: Hgb A1c MFr Bld: 6.6 % — ABNORMAL HIGH (ref 4.6–6.5)

## 2021-08-25 LAB — BASIC METABOLIC PANEL
BUN: 25 mg/dL — ABNORMAL HIGH (ref 6–23)
CO2: 23 mEq/L (ref 19–32)
Calcium: 9.4 mg/dL (ref 8.4–10.5)
Chloride: 108 mEq/L (ref 96–112)
Creatinine, Ser: 2.4 mg/dL — ABNORMAL HIGH (ref 0.40–1.50)
GFR: 23.87 mL/min — ABNORMAL LOW (ref >60.00)
Glucose, Bld: 113 mg/dL — ABNORMAL HIGH (ref 70–99)
Potassium: 4.8 mEq/L (ref 3.5–5.1)
Sodium: 140 mEq/L (ref 135–145)

## 2021-08-25 LAB — HEPATIC FUNCTION PANEL
ALT: 15 U/L (ref 0–53)
AST: 22 U/L (ref 0–37)
Albumin: 4.1 g/dL (ref 3.5–5.2)
Alkaline Phosphatase: 46 U/L (ref 39–117)
Bilirubin, Direct: 0.1 mg/dL (ref 0.0–0.3)
Total Bilirubin: 0.3 mg/dL (ref 0.2–1.2)
Total Protein: 7.1 g/dL (ref 6.0–8.3)

## 2021-08-25 LAB — LIPID PANEL
Cholesterol: 148 mg/dL (ref 0–200)
HDL: 61.7 mg/dL (ref >39.00)
LDL Cholesterol: 74 mg/dL (ref 0–99)
NonHDL: 86.13
Total CHOL/HDL Ratio: 2
Triglycerides: 61 mg/dL (ref 0.0–149.0)
VLDL: 12.2 mg/dL (ref 0.0–40.0)

## 2021-08-25 LAB — CBC WITH DIFFERENTIAL/PLATELET
Basophils Absolute: 0 10*3/uL (ref 0.0–0.1)
Basophils Relative: 0.5 % (ref 0.0–3.0)
Eosinophils Absolute: 0.1 10*3/uL (ref 0.0–0.7)
Eosinophils Relative: 3.2 % (ref 0.0–5.0)
HCT: 31.7 % — ABNORMAL LOW (ref 39.0–52.0)
Hemoglobin: 10.7 g/dL — ABNORMAL LOW (ref 13.0–17.0)
Lymphocytes Relative: 30.2 % (ref 12.0–46.0)
Lymphs Abs: 1.2 10*3/uL (ref 0.7–4.0)
MCHC: 33.7 g/dL (ref 30.0–36.0)
MCV: 95.2 fl (ref 78.0–100.0)
Monocytes Absolute: 0.4 10*3/uL (ref 0.1–1.0)
Monocytes Relative: 11.5 % (ref 3.0–12.0)
Neutro Abs: 2.1 10*3/uL (ref 1.4–7.7)
Neutrophils Relative %: 54.6 % (ref 43.0–77.0)
Platelets: 168 10*3/uL (ref 150.0–400.0)
RBC: 3.33 Mil/uL — ABNORMAL LOW (ref 4.22–5.81)
RDW: 13.9 % (ref 11.5–15.5)
WBC: 3.8 10*3/uL — ABNORMAL LOW (ref 4.0–10.5)

## 2021-08-25 MED ORDER — CARVEDILOL 3.125 MG PO TABS: 3.1250 mg | ORAL_TABLET | Freq: Two times a day (BID) | ORAL | 11 refills | Status: DC

## 2021-08-25 NOTE — Assessment & Plan Note (Signed)
New onset mild to mod symptomatic with more than normal exertions, for add coreg 3.125 bid, cont asa, statin, and refer cardiology

## 2021-08-25 NOTE — Assessment & Plan Note (Signed)
Lab Results  Component Value Date   CREATININE 2.14 (H) 05/11/2021   Stable overall, cont to avoid nephrotoxins, for f/u lab today - may be an issue in considering cardiac cath
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
## 2021-08-25 NOTE — Assessment & Plan Note (Signed)
Lab Results  Component Value Date   HGBA1C 6.4 05/11/2021   Stable, pt to continue current medical treatment  - diet

## 2021-08-25 NOTE — Assessment & Plan Note (Signed)
Lab Results  Component Value Date   LDLCALC 70 05/11/2021   Stable, pt to continue current statin simvastatin

## 2021-08-25 NOTE — Patient Instructions (Signed)
Please take all new medication as prescribed - the coreg 3.125 mg twice per day  Please continue all other medications as before, including the aspirin and the statin medication for cholesterol  Please have the pharmacy call with any other refills you may need.  Please continue your efforts at being more active, low cholesterol diet, and weight control.  Please keep your appointments with your specialists as you may have planned  You will be contacted regarding the referral for: Cardiology  Please go to the XRAY Department in the first floor for the x-ray testing  Please go to the LAB at the blood drawing area for the tests to be done  You will be contacted by phone if any changes need to be made immediately.  Otherwise, you will receive a letter about your results with an explanation, but please check with MyChart first.  Please remember to sign up for MyChart if you have not done so, as this will be important to you in the future with finding out test results, communicating by private email, and scheduling acute appointments online when needed.  Please make an Appointment to return for your 1 month follow up appt

## 2021-08-25 NOTE — Assessment & Plan Note (Signed)
Severe uncontrolled today, to add coreg as above, f/u bp at home and next visit BP Readings from Last 3 Encounters:  08/25/21 (!) 180/70  05/11/21 140/80  05/11/21 140/80

## 2021-09-02 ENCOUNTER — Ambulatory Visit (INDEPENDENT_AMBULATORY_CARE_PROVIDER_SITE_OTHER): Payer: Medicare Other

## 2021-09-02 ENCOUNTER — Ambulatory Visit (HOSPITAL_COMMUNITY)
Admission: EM | Admit: 2021-09-02 | Discharge: 2021-09-02 | Disposition: A | Discharge status: Home / Self Care | Payer: Medicare Other | Attending: Emergency Medicine | Admitting: Emergency Medicine

## 2021-09-02 ENCOUNTER — Other Ambulatory Visit: Payer: Self-pay

## 2021-09-02 ENCOUNTER — Encounter (HOSPITAL_COMMUNITY): Payer: Self-pay

## 2021-09-02 DIAGNOSIS — R079 Chest pain, unspecified: Secondary | ICD-10-CM | POA: Diagnosis not present

## 2021-09-02 DIAGNOSIS — S52615A Nondisplaced fracture of left ulna styloid process, initial encounter for closed fracture: Secondary | ICD-10-CM | POA: Diagnosis not present

## 2021-09-02 DIAGNOSIS — S52352A Displaced comminuted fracture of shaft of radius, left arm, initial encounter for closed fracture: Secondary | ICD-10-CM | POA: Diagnosis not present

## 2021-09-02 DIAGNOSIS — S299XXA Unspecified injury of thorax, initial encounter: Secondary | ICD-10-CM | POA: Diagnosis not present

## 2021-09-02 DIAGNOSIS — S52502A Unspecified fracture of the lower end of left radius, initial encounter for closed fracture: Secondary | ICD-10-CM

## 2021-09-02 DIAGNOSIS — S52612A Displaced fracture of left ulna styloid process, initial encounter for closed fracture: Secondary | ICD-10-CM | POA: Diagnosis not present

## 2021-09-02 MED ORDER — ACETAMINOPHEN 500 MG PO TABS: 500.0000 mg | ORAL_TABLET | Freq: Four times a day (QID) | ORAL | 0 refills | Status: DC | PRN

## 2021-09-02 MED ORDER — BACLOFEN 10 MG PO TABS: 10.0000 mg | ORAL_TABLET | Freq: Every evening | ORAL | 0 refills | Status: DC | PRN
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
## 2021-09-02 NOTE — ED Notes (Signed)
Notified ortho tech of ordered splint

## 2021-09-02 NOTE — Discharge Instructions (Addendum)
Your x-ray today showed a fracture ( break in bone) of ulna and radius of your left wrist/end of your arm   A splint has been applied to your wrist. This is used to protect your injury and prevent further damage. Leave in place until seen by orthopedic specialist. Do not put objects into splint to scratch skin. If numbness or tingling occurs after placement please return to Urgent Care for evaluation.   Follow up with orthopedic specialist in 1-2 weeks. Call practice to make appointment. Information listed below. You may go to any orthopedic provider you deem fit.  Please do not put weight on fracture.   Take tylenol every 6 hours for 5 days to help with pain and swelling, then may use if needed, we will avoid use of anti inflammatory medicine due to your kidneys   May take baclofen at bedtime for additional comfort

## 2021-09-02 NOTE — Progress Notes (Signed)
Orthopedic Tech Progress Note Patient Details:  Greg [name] [birth year] [id number]  Ortho Devices Type of Ortho Device: Sugartong splint, Arm sling Ortho Device/Splint Location: LUE Ortho Device/Splint Interventions: Ordered, Application, Adjustment   Post Interventions Patient Tolerated: Well Instructions Provided: Care of [name] 09/02/2021, 9:50 AM

## 2021-09-02 NOTE — ED Triage Notes (Addendum)
Pt states he tripped over a brick on his concrete patio and fell landing on his lt side. C/o lt wrist swelling, lt rib pain. Denies LOC.

## 2021-09-03 ENCOUNTER — Telehealth: Payer: Self-pay | Admitting: Internal Medicine

## 2021-09-03 MED ORDER — TRAMADOL HCL 50 MG PO TABS: 50.0000 mg | ORAL_TABLET | Freq: Four times a day (QID) | ORAL | 0 refills | Status: DC | PRN

## 2021-09-03 NOTE — Telephone Encounter (Signed)
Ok tramadol sent to [facility]

## 2021-09-03 NOTE — Telephone Encounter (Signed)
Patient states he was seen in th ed for a broken L. Wrist  Patient states he was prescribed baclofen (LIORESAL) 10 MG tablet   Patient states he was not prescribed pain medication and would like to request a rx for pain medication  Patient was last seen by provider on 08-25-2021  Pharmacy [facility] [phone] - [place], [place]

## 2021-09-08 DIAGNOSIS — M25531 Pain in right wrist: Secondary | ICD-10-CM | POA: Diagnosis not present

## 2021-09-08 DIAGNOSIS — S52522A Torus fracture of lower end of left radius, initial encounter for closed fracture: Secondary | ICD-10-CM | POA: Diagnosis not present

## 2021-09-08 DIAGNOSIS — S52502A Unspecified fracture of the lower end of left radius, initial encounter for closed fracture: Secondary | ICD-10-CM | POA: Insufficient documentation

## 2021-09-20 ENCOUNTER — Other Ambulatory Visit: Payer: Self-pay | Admitting: Internal Medicine

## 2021-09-21 NOTE — Telephone Encounter (Signed)
Please refill as per office routine med refill policy (all routine meds to be refilled for 3 mo or monthly (per pt preference) up to one year from last visit, then month to month grace period for 3 mo, then further med refills will have to be denied) ? ?

## 2021-09-22 ENCOUNTER — Encounter: Payer: Self-pay | Admitting: Internal Medicine

## 2021-09-22 ENCOUNTER — Other Ambulatory Visit: Payer: Self-pay

## 2021-09-22 ENCOUNTER — Ambulatory Visit (INDEPENDENT_AMBULATORY_CARE_PROVIDER_SITE_OTHER): Payer: Medicare Other | Admitting: Internal Medicine

## 2021-09-22 VITALS — BP 146/78 | HR 74 | Ht 69.0 in | Wt 182.0 lb

## 2021-09-22 DIAGNOSIS — I10 Essential (primary) hypertension: Secondary | ICD-10-CM

## 2021-09-22 DIAGNOSIS — R7309 Other abnormal glucose: Secondary | ICD-10-CM

## 2021-09-22 DIAGNOSIS — I209 Angina pectoris, unspecified: Secondary | ICD-10-CM

## 2021-09-22 DIAGNOSIS — N184 Chronic kidney disease, stage 4 (severe): Secondary | ICD-10-CM

## 2021-09-22 DIAGNOSIS — E559 Vitamin D deficiency, unspecified: Secondary | ICD-10-CM | POA: Diagnosis not present

## 2021-09-22 DIAGNOSIS — S52522A Torus fracture of lower end of left radius, initial encounter for closed fracture: Secondary | ICD-10-CM | POA: Diagnosis not present

## 2021-09-22 DIAGNOSIS — Z0001 Encounter for general adult medical examination with abnormal findings: Secondary | ICD-10-CM

## 2021-09-22 DIAGNOSIS — E7800 Pure hypercholesterolemia, unspecified: Secondary | ICD-10-CM | POA: Diagnosis not present

## 2021-09-22 DIAGNOSIS — I700 Atherosclerosis of aorta: Secondary | ICD-10-CM | POA: Diagnosis not present

## 2021-09-22 NOTE — Assessment & Plan Note (Signed)
Lab Results  Component Value Date   HGBA1C 6.6 (H) 08/25/2021   Stable, pt to continue current medical treatment  - diet

## 2021-09-22 NOTE — Assessment & Plan Note (Signed)
Pt to continue zocor, low chol diet, excercise

## 2021-09-22 NOTE — Assessment & Plan Note (Signed)
Lab Results  Component Value Date   LDLCALC 74 08/25/2021   Mild uncontrolled, goal ldl < 70 , pt to continue current statin zocor 40 as declines change

## 2021-09-22 NOTE — Assessment & Plan Note (Signed)
Denies recent chest pain worsening, for treadmill testing, has f/u cardiology apr 2023

## 2021-09-22 NOTE — Patient Instructions (Signed)
You will be contacted regarding the referral for: heart stress test (with medicaiton only where you dont have to walk on the treatmill)  Please continue all other medications as before, and refills have been done if requested.  Please have the pharmacy call with any other refills you may need.  Please continue your efforts at being more active, low cholesterol diet, and weight control.  You are otherwise up to date with prevention measures today.  Please keep your appointments with your specialists as you may have planned - cardiology apr 6  We can hold on further lab testing today  Please make an Appointment to return in 6 months, or sooner if needed

## 2021-09-22 NOTE — Assessment & Plan Note (Signed)
Lab Results  Component Value Date   CREATININE 2.40 (H) 08/25/2021   Stable overall, cont to avoid nephrotoxins

## 2021-09-22 NOTE — Assessment & Plan Note (Signed)

## 2021-09-22 NOTE — Assessment & Plan Note (Signed)
BP Readings from Last 3 Encounters:  09/22/21 (!) 146/78  09/02/21 (!) 182/76  08/25/21 (!) 180/70   Stable, pt to continue medical treatment norvasc, coreg
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
## 2021-09-22 NOTE — Assessment & Plan Note (Signed)
Last vitamin D Lab Results  Component Value Date   VD25OH 49.18 05/11/2021   Stable, cont oral replacement  

## 2021-09-28 ENCOUNTER — Telehealth: Payer: Self-pay

## 2021-09-28 NOTE — Telephone Encounter (Signed)
Patient walked into the office today requesting a sooner appointment with Dr. Johnsie Cancel. Patient states that he has notices his heart rate beating faster with exertion. He also states he has some shortness of breath with exertion that is more than his usual. He reports some chest pain with movement that resolves on its own. He does not keep up with his heart rate or blood pressure at home. He reports that while at rest he feels great. Patient currently scheduled for an appointment with Dr. Johnsie Cancel on 4/6. Spoke with Dr. Johnsie Cancel and will add patient to wait list for sooner appointment with him or an APP.
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
## 2021-09-30 ENCOUNTER — Ambulatory Visit: Payer: Medicare Other | Admitting: Physician Assistant

## 2021-09-30 ENCOUNTER — Telehealth: Payer: Self-pay | Admitting: Cardiovascular Disease

## 2021-09-30 ENCOUNTER — Encounter: Payer: Self-pay | Admitting: Physician Assistant

## 2021-09-30 ENCOUNTER — Other Ambulatory Visit: Payer: Self-pay

## 2021-09-30 ENCOUNTER — Ambulatory Visit (INDEPENDENT_AMBULATORY_CARE_PROVIDER_SITE_OTHER): Payer: Medicare Other

## 2021-09-30 VITALS — BP 158/72 | HR 70 | Ht 68.0 in | Wt 182.0 lb

## 2021-09-30 DIAGNOSIS — E782 Mixed hyperlipidemia: Secondary | ICD-10-CM

## 2021-09-30 DIAGNOSIS — R7309 Other abnormal glucose: Secondary | ICD-10-CM

## 2021-09-30 DIAGNOSIS — R002 Palpitations: Secondary | ICD-10-CM

## 2021-09-30 DIAGNOSIS — R42 Dizziness and giddiness: Secondary | ICD-10-CM

## 2021-09-30 DIAGNOSIS — I10 Essential (primary) hypertension: Secondary | ICD-10-CM

## 2021-09-30 DIAGNOSIS — I779 Disorder of arteries and arterioles, unspecified: Secondary | ICD-10-CM

## 2021-09-30 DIAGNOSIS — E039 Hypothyroidism, unspecified: Secondary | ICD-10-CM

## 2021-09-30 LAB — TSH: TSH: 3.13 u[IU]/mL (ref 0.450–4.500)

## 2021-09-30 MED ORDER — AMLODIPINE BESYLATE 10 MG PO TABS: 10.0000 mg | ORAL_TABLET | Freq: Every day | ORAL | 3 refills | Status: DC

## 2021-09-30 NOTE — Telephone Encounter (Signed)
Pt c/o medication issue:  1. Name of Medication: nebivolol (BYSTOLIC) 10 MG tablet  2. How are you currently taking this medication (dosage and times per day)? Take 1 tablet (10 mg total) by mouth daily  3. Are you having a reaction (difficulty breathing--STAT)? no  4. What is your medication issue? Son is calling back to say patient is not taking the above medication.

## 2021-09-30 NOTE — Progress Notes (Unsigned)
[id number] 7 day zio xt from office inventory applied.

## 2021-09-30 NOTE — Patient Instructions (Signed)
Medication Instructions:   INCREASE Amlodipine one ( 1 ) tablet by mouth ( 10 mg) daily.   *If you need a refill on your cardiac medications before your next appointment, please call your pharmacy*   Lab Work:  TODAY!!!!  TSH  If you have labs (blood work) drawn today and your tests are completely normal, you will receive your results only by: [place] (if you have MyChart) OR A paper copy in the mail If you have any lab test that is abnormal or we need to change your treatment, we will call you to review the results.   Testing/Procedures:  Your physician has requested that you have a carotid duplex. This test is an ultrasound of the carotid arteries in your neck. It looks at blood flow through these arteries that supply the brain with blood. Allow one hour for this exam. There are no restrictions or special instructions.  ZIO XT- Long Term Monitor Instructions  Your physician has requested you wear a ZIO patch monitor for 14 days.  This is a single patch monitor. [employer] supplies one patch monitor per enrollment. Additional stickers are not available. Please do not apply patch if you will be having a Nuclear Stress Test,  Echocardiogram, Cardiac CT, MRI, or Chest Xray during the period you would be wearing the  monitor. The patch cannot be worn during these tests. You cannot remove and re-apply the  ZIO XT patch monitor.  Your ZIO patch monitor will be mailed 3 day USPS to your address on file. It may take 3-5 days  to receive your monitor after you have been enrolled.  Once you have received your monitor, please review the enclosed instructions. Your monitor  has already been registered assigning a specific monitor serial # to you.  Billing and Patient Assistance Program Information  We have supplied [employer] with any of your insurance information on file for billing purposes. [employer] offers a sliding scale Patient Assistance Program for patients that do not have   insurance, or whose insurance does not completely cover the cost of the ZIO monitor.  You must apply for the Patient Assistance Program to qualify for this discounted rate.  To apply, please call [employer] at [phone], select option 4, select option 2, ask to apply for  Patient Assistance Program. Theodore Demark will ask your household income, and how many people  are in your household. They will quote your out-of-pocket cost based on that information.  [employer] will also be able to set up a 4-month, interest-free payment plan if needed.  Applying the monitor   Shave hair from upper left chest.  Hold abrader disc by orange tab. Rub abrader in 40 strokes over the upper left chest as  indicated in your monitor instructions.  Clean area with 4 enclosed alcohol pads. Let dry.  Apply patch as indicated in monitor instructions. Patch will be placed under collarbone on left  side of chest with arrow pointing upward.  Rub patch adhesive wings for 2 minutes. Remove white label marked "1". Remove the white  label marked "2". Rub patch adhesive wings for 2 additional minutes.  While looking in a mirror, press and release button in center of patch. A small green light will  flash 3-4 times. This will be your only indicator that the monitor has been turned on.  Do not shower for the first 24 hours. You may shower after the first 24 hours.  Press the button if you feel a symptom. You will hear a  small click. Record Date, Time and  Symptom in the Patient Logbook.  When you are ready to remove the patch, follow instructions on the last 2 pages of Patient  Logbook. Stick patch monitor onto the last page of Patient Logbook.  Place Patient Logbook in the blue and white box. Use locking tab on box and tape box closed  securely. The blue and white box has prepaid postage on it. Please place it in the mailbox as  soon as possible. Your physician should have your test results approximately 7 days after the  monitor  has been mailed back to [facility].  Call [place] at [phone] if you have questions regarding  your ZIO XT patch monitor. Call them immediately if you see an orange light blinking on your  monitor.  If your monitor falls off in less than 4 days, contact our Monitor department at [phone].  If your monitor becomes loose or falls off after 4 days call [employer] at [phone] for  suggestions on securing your monitor    Follow-Up: At [facility], you and your health needs are our priority.  As part of our continuing mission to provide you with exceptional heart care, we have created designated Provider Care Teams.  These Care Teams include your primary Cardiologist (physician) and Advanced Practice Providers (APPs -  Physician Assistants and Nurse Practitioners) who all work together to provide you with the care you need, when you need it.  We recommend signing up for the patient portal called "MyChart".  Sign up information is provided on this After Visit Summary.  MyChart is used to connect with patients for Virtual Visits (Telemedicine).  Patients are able to view lab/test results, encounter notes, upcoming appointments, etc.  Non-urgent messages can be sent to your provider as well.   To learn more about what you can do with MyChart, go to [email or web address].    Your next appointment:   10 week(s)  The format for your next appointment:   In Person  Provider:   [name], MD     Other Instructions  Call office at [phone] to get carotid scheduled.  Let us know if you are taking both Bystolic and Coreg.  Check you blood pressure readings and call in 2 weeks @ [phone].

## 2021-09-30 NOTE — Telephone Encounter (Signed)
Patient's son called and stated patient is not taking his Bystolic. Informed patient's son that we would let the provider know and update his medications list. Encourage him to have patient take the rest of his medications as prescribed. Patient's son verbalized understanding.

## 2021-10-12 DIAGNOSIS — R002 Palpitations: Secondary | ICD-10-CM | POA: Diagnosis not present

## 2021-10-12 DIAGNOSIS — E039 Hypothyroidism, unspecified: Secondary | ICD-10-CM | POA: Diagnosis not present

## 2021-10-12 DIAGNOSIS — I10 Essential (primary) hypertension: Secondary | ICD-10-CM | POA: Diagnosis not present

## 2021-10-12 DIAGNOSIS — S52522A Torus fracture of lower end of left radius, initial encounter for closed fracture: Secondary | ICD-10-CM | POA: Diagnosis not present

## 2021-10-12 DIAGNOSIS — E782 Mixed hyperlipidemia: Secondary | ICD-10-CM | POA: Diagnosis not present

## 2021-10-13 ENCOUNTER — Ambulatory Visit (HOSPITAL_COMMUNITY)
Admission: RE | Admit: 2021-10-13 | Discharge: 2021-10-13 | Disposition: A | Discharge status: Home / Self Care | Payer: Medicare Other | Source: Ambulatory Visit | Attending: Cardiology | Admitting: Cardiology

## 2021-10-13 ENCOUNTER — Other Ambulatory Visit: Payer: Self-pay

## 2021-10-13 ENCOUNTER — Ambulatory Visit (INDEPENDENT_AMBULATORY_CARE_PROVIDER_SITE_OTHER): Payer: Medicare Other | Admitting: Cardiology

## 2021-10-13 ENCOUNTER — Encounter: Payer: Self-pay | Admitting: Cardiology

## 2021-10-13 ENCOUNTER — Other Ambulatory Visit: Payer: Self-pay | Admitting: Physician Assistant

## 2021-10-13 VITALS — BP 110/78 | HR 87 | Ht 68.0 in | Wt 206.0 lb

## 2021-10-13 DIAGNOSIS — R0989 Other specified symptoms and signs involving the circulatory and respiratory systems: Secondary | ICD-10-CM | POA: Diagnosis not present

## 2021-10-13 DIAGNOSIS — R7309 Other abnormal glucose: Secondary | ICD-10-CM | POA: Diagnosis not present

## 2021-10-13 DIAGNOSIS — R42 Dizziness and giddiness: Secondary | ICD-10-CM | POA: Diagnosis not present

## 2021-10-13 DIAGNOSIS — E785 Hyperlipidemia, unspecified: Secondary | ICD-10-CM

## 2021-10-13 DIAGNOSIS — E782 Mixed hyperlipidemia: Secondary | ICD-10-CM | POA: Insufficient documentation

## 2021-10-13 DIAGNOSIS — I10 Essential (primary) hypertension: Secondary | ICD-10-CM | POA: Insufficient documentation

## 2021-10-13 DIAGNOSIS — I779 Disorder of arteries and arterioles, unspecified: Secondary | ICD-10-CM | POA: Diagnosis not present

## 2021-10-13 DIAGNOSIS — I6521 Occlusion and stenosis of right carotid artery: Secondary | ICD-10-CM | POA: Diagnosis not present

## 2021-10-13 DIAGNOSIS — E039 Hypothyroidism, unspecified: Secondary | ICD-10-CM | POA: Insufficient documentation

## 2021-10-13 DIAGNOSIS — R002 Palpitations: Secondary | ICD-10-CM | POA: Diagnosis not present

## 2021-10-13 NOTE — Patient Instructions (Signed)
Medication Instructions:  Your physician recommends that you continue on your current medications as directed. Please refer to the Current Medication list given to you today.  *If you need a refill on your cardiac medications before your next appointment, please call your pharmacy*   Lab Work: None If you have labs (blood work) drawn today and your tests are completely normal, you will receive your results only by: [place] (if you have MyChart) OR A paper copy in the mail If you have any lab test that is abnormal or we need to change your treatment, we will call you to review the results.   Testing/Procedures: None   Follow-Up: At [facility], you and your health needs are our priority.  As part of our continuing mission to provide you with exceptional heart care, we have created designated Provider Care Teams.  These Care Teams include your primary Cardiologist (physician) and Advanced Practice Providers (APPs -  Physician Assistants and Nurse Practitioners) who all work together to provide you with the care you need, when you need it.  We recommend signing up for the patient portal called "MyChart".  Sign up information is provided on this After Visit Summary.  MyChart is used to connect with patients for Virtual Visits (Telemedicine).  Patients are able to view lab/test results, encounter notes, upcoming appointments, etc.  Non-urgent messages can be sent to your provider as well.   To learn more about what you can do with MyChart, go to [email or web address].    Your next appointment:   April 6th at 9:15am   The format for your next appointment:   In Person  Provider:   [name], MD     Other Instructions
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
## 2021-10-15 ENCOUNTER — Telehealth (HOSPITAL_COMMUNITY): Payer: Self-pay | Admitting: Internal Medicine

## 2021-10-15 NOTE — Telephone Encounter (Signed)
I called patient to schedule Myoview that you ordered. Prior authorization  was received from  [facility]  on 10/15/21 and I called patient to schedule and he doesn't wish to schedule now due to he is going to see a surgeon for Carotid blockage. Order will be removed from the active [place] and if patient calls back to reschedule we can reinstate the order. Thank you

## 2021-10-16 ENCOUNTER — Telehealth: Payer: Self-pay

## 2021-10-16 DIAGNOSIS — I471 Supraventricular tachycardia: Secondary | ICD-10-CM

## 2021-10-16 NOTE — Telephone Encounter (Signed)
The patient has been notified of the result and verbalized understanding.  All questions (if any) were answered. Michaelyn Barter, RN 10/16/2021 11:31 AM   Placed order for EP referral.

## 2021-10-16 NOTE — Telephone Encounter (Signed)
-----   Message from Josue Hector, MD sent at 10/12/2021  4:54 PM EST ----- Episodes of atrial tachycardia/SVT f/u with EP to assess

## 2021-10-22 ENCOUNTER — Ambulatory Visit: Payer: Medicare Other | Admitting: Vascular Surgery

## 2021-10-22 ENCOUNTER — Other Ambulatory Visit: Payer: Self-pay

## 2021-10-22 ENCOUNTER — Encounter: Payer: Self-pay | Admitting: Vascular Surgery

## 2021-10-22 VITALS — BP 137/80 | HR 84 | Temp 97.8°F | Resp 16 | Ht 69.0 in | Wt 179.0 lb

## 2021-10-22 DIAGNOSIS — I6521 Occlusion and stenosis of right carotid artery: Secondary | ICD-10-CM
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
## 2021-10-23 ENCOUNTER — Telehealth: Payer: Self-pay

## 2021-10-23 NOTE — Telephone Encounter (Signed)
° °  Pre-operative Risk Assessment    Patient Name: Greg [name]  DOB: 1932/12/31 MRN: [id number]      Request for Surgical Clearance    Procedure:  right carotid endarterectomy 11/10/2021 Date of Surgery:  Clearance 11/10/21                                 Surgeon:  Servando Snare Surgeon's Group or Practice Name:  [facility] Phone number:  [phone] Fax number:  [phone]   Type of Clearance Requested:   - Medical : cardiac   Type of Anesthesia:  General    Additional requests/questions:   No  Signed, Kathreen Devoid   10/23/2021, 1:58 PM

## 2021-10-23 NOTE — Telephone Encounter (Signed)
° °  Patient Name: Greg [name]  DOB: 1933/01/29 MRN: [id number]  Primary Cardiologist: [name], MD  Chart reviewed as part of pre-operative protocol coverage.  Patient contacted on 10/23/2021 as part of preoperative screening process.  No answer.  Left voicemail message requesting patient return call.   Lenna Sciara, NP 10/23/2021, 2:20 PM

## 2021-10-23 NOTE — Telephone Encounter (Signed)
° °  Patient Name: Greg [name]  DOB: 1933-07-27 MRN: [id number]  Primary Cardiologist: [name], MD  Chart reviewed as part of pre-operative protocol coverage. 88 year old male with a history of hx of carotid artery stenosis, dyspnea on exertion, hypertension, hyperlipidemia, chronic venous insufficiency, hypothyroidism, CKD stage IV, BPH, and anemia.   Echocardiogram in March 2021 showed EF 55 to 60%, grade 2 DD, mild asymmetric LVH of the basal septal segment, no significant valvular abnormalities, mild dilation of ascending aorta 37 mm.   He saw his PCP on 09/22/2021 with concern for new onset chest pain. Stress test was ordered and recommended but never performed.    He saw cardiology on September 30, 2021 in the setting of palpitations. Zio monitor with episodes of atrial tachycardia/SVT, follow-up with EP was recommended.  Carotid Dopplers showed R ICA 80 to 99% stenosis.  He was last seen in the office on 10/13/2021 by Dr. Gardiner Rhyme and reported intermittent dizziness. He was referred to vascular surgery for management of carotid artery stenosis.  Received surgical clearance request for right carotid endarterectomy scheduled for 11/10/2021.  Spoke with patient over phone on 10/23/2021. Patient reports ongoing dyspnea on exertion, palpitations, unchanged from prior visits.   Dr. Johnsie Cancel, please advise on proceeding with surgical clearance for this patient and whether or not he requires office visit/additional testing prior to clearance.  Thank you.  Lenna Sciara, NP 10/23/2021, 4:11 PM

## 2021-10-26 NOTE — Telephone Encounter (Signed)
° °  Primary Cardiologist: [name], MD  Chart reviewed as part of pre-operative protocol coverage. Given past medical history and time since last visit, based on ACC/AHA guidelines, Greg [name] would be at acceptable risk for the planned procedure without further cardiovascular testing.   His RCRI is a class II risk, 0.9% risk of major cardiac event.  I will route this recommendation to the requesting party via Epic fax function and remove from pre-op pool.  Please call with questions.  [name]. Rien Marland NP-C    10/26/2021, 11:25 AM [address] Office [phone] Fax [phone]

## 2021-10-30 ENCOUNTER — Telehealth: Payer: Self-pay | Admitting: Internal Medicine

## 2021-10-30 DIAGNOSIS — Z20822 Contact with and (suspected) exposure to covid-19: Secondary | ICD-10-CM | POA: Diagnosis not present

## 2021-10-30 MED ORDER — FEXOFENADINE HCL 180 MG PO TABS
180.0000 mg | ORAL_TABLET | Freq: Every day | ORAL | 1 refills | Status: DC
Start: 2021-10-30 — End: 2022-10-21

## 2021-10-30 NOTE — Telephone Encounter (Signed)
Refilled fexofenadine (ALLEGRA) 180 MG tablet. ?

## 2021-10-30 NOTE — Telephone Encounter (Signed)
1.Medication Requested: fexofenadine (ALLEGRA) 180 MG tablet ? ?2. Pharmacy (Name, Street, [place]): [phone] - [place], [place] ? ?3. On Med List: yes ? ?4. Last Visit with PCP: 09-22-2021 ? ?5. Next visit date with PCP: 11-09-2021 ? ? ?Agent: Please be advised that RX refills may take up to 3 business days. We ask that you follow-up with your pharmacy.  ?

## 2021-10-31 DIAGNOSIS — Z20822 Contact with and (suspected) exposure to covid-19: Secondary | ICD-10-CM | POA: Diagnosis not present

## 2021-11-02 ENCOUNTER — Other Ambulatory Visit (HOSPITAL_COMMUNITY): Payer: Medicare Other

## 2021-11-02 NOTE — Progress Notes (Signed)
Surgical Instructions ? ? ?Your procedure is scheduled on Tuesday 11/10/2021. ? ?Report to [facility] Main Entrance "A" at 05:30 A.M., then check in with the Admitting office. ? ?Call [phone] if you have problems or questions between now and the morning of surgery: ?  ?Remember: ?Do not eat or drink after midnight the night before your surgery ? ?  ?Take these medicines the morning of surgery with A SIP OF WATER:  ?Amlodipine (Norvasc) ?Carvedilol (Coreg) ?Fexofenadine (Allegra) ?Levothyroxine (Synthroid) ?Pantoprazole (Protonix) ? ? ?As of today, STOP taking NSAIDS - Aleve, Naproxen, Ibuprofen, Motrin, Advil, Goody's, BC's, all herbal medications, fish oil, and all vitamins. ? ?Per Dr. Claretha Cooper office, you will continue taking your aspirin ? ?HOW TO MANAGE YOUR DIABETES ?BEFORE AND AFTER SURGERY ? ?Why is it important to control my blood sugar before and after surgery? ?Improving blood sugar levels before and after surgery helps healing and can limit problems. ?A way of improving blood sugar control is eating a healthy diet by: ? Eating less sugar and carbohydrates ? Increasing activity/exercise ? Talking with your doctor about reaching your blood sugar goals ?High blood sugars (greater than 180 mg/dL) can raise your risk of infections and slow your recovery, so you will need to focus on controlling your diabetes during the weeks before surgery. ?Make sure that the doctor who takes care of your diabetes knows about your planned surgery including the date and location. ? ?How do I manage my blood sugar before surgery? ?Check your blood sugar at least 4 times a day, starting 2 days before surgery, to make sure that the level is not too high or low. ? ?Check your blood sugar the morning of your surgery when you wake up and every 2 hours until you get to the Short Stay unit. ? ?If your blood sugar is less than 70 mg/dL, you will need to treat for low blood sugar: ?Do not take insulin. ?Treat a low blood sugar (less  than 70 mg/dL) with ? cup of clear juice (cranberry or apple), 4 glucose tablets, OR glucose gel. ?Recheck blood sugar in 15 minutes after treatment (to make sure it is greater than 70 mg/dL). If your blood sugar is not greater than 70 mg/dL on recheck, call [phone] for further instructions. ?Report your blood sugar to the short stay nurse when you get to Short Stay. ? ?If you are admitted to the hospital after surgery: ?Your blood sugar will be checked by the staff and you will probably be given insulin after surgery (instead of oral diabetes medicines) to make sure you have good blood sugar levels. ?The goal for blood sugar control after surgery is 80-180 mg/dL. to ? ?After your pre-procedure COVID test ?You are not required to quarantine however you are required to wear a well-fitting mask when you are out and around people not in your household.  If your mask becomes wet or soiled, replace with a new one. ? ?Wash your hands often with soap and water for 20 seconds or clean your hands with an alcohol-based hand sanitizer that contains at least 60% alcohol. ? ?Do not share personal items. ? ?Notify your provider: ?if you are in close contact with someone who has COVID  ?or if you develop a fever of 100.4 or greater, sneezing, cough, sore throat, shortness of breath or body aches. ? ? ?THE DAY OF SURGERY ?         ?Do not wear jewelry ? ?Do not wear lotions, powders, colognes, or  deodorant. ? ?Do not shave 48 hours prior to surgery.  Men may shave face and neck. ? ?Do not bring valuables to the hospital - [facility] is not responsible for any belongings or valuables. ? ?Do NOT Smoke (Tobacco/Vaping) or drink Alcohol 24 hours prior to your procedure ? ?If you use a CPAP at night, please bring your mask for your overnight stay. ?  ?Contacts, glasses, hearing aids, dentures or partials may not be worn into surgery, please bring cases for these belongings ?  ?For patients admitted to the hospital, discharge time  will be determined by your treatment team. ?  ?Patients discharged the day of surgery will not be allowed to drive home, and someone needs to stay with them for 24 hours. ? ?NO VISITORS WILL BE ALLOWED IN PRE-OP WHERE PATIENTS ARE PREPPED FOR SURGERY.  ONLY 1 SUPPORT PERSON MAY BE PRESENT IN THE WAITING ROOM WHILE YOU ARE IN SURGERY.  IF YOU ARE TO BE ADMITTED, ONCE YOU ARE IN YOUR ROOM YOU WILL BE ALLOWED TWO (2) VISITORS. 1 (ONE) VISITOR MAY STAY OVERNIGHT BUT MUST ARRIVE TO THE ROOM BY 8pm.  Minor children may have two parents present. Special consideration for safety and communication needs will be reviewed on a case by case basis. ? ?Special instructions:   ? ?Oral Hygiene is also important to reduce your risk of infection.  Remember - BRUSH YOUR TEETH THE MORNING OF SURGERY WITH YOUR REGULAR TOOTHPASTE ? ? ?[facility]- Preparing For Surgery ? ?Before surgery, you can play an important role. Because skin is not sterile, your skin needs to be as free of germs as possible. You can reduce the number of germs on your skin by washing with CHG (chlorahexidine gluconate) Soap before surgery.  CHG is an antiseptic cleaner which kills germs and bonds with the skin to continue killing germs even after washing.   ? ? ?Please do not use if you have an allergy to CHG or antibacterial soaps. If your skin becomes reddened/irritated stop using the CHG.  ?Do not shave (including legs and underarms) for at least 48 hours prior to first CHG shower. It is OK to shave your face. ? ?Please follow these instructions carefully. ?  ? ? Shower the NIGHT BEFORE SURGERY and the MORNING OF SURGERY with CHG Soap.  ? ?If you chose to wash your hair, wash your hair first as usual with your normal shampoo. After you shampoo, rinse your hair and body thoroughly to remove the shampoo.   ? ?Then Wash Face and genitals (private parts) with your normal soap and rinse thoroughly to remove soap. ? ?Next use the CHG Soap as you would any other liquid  soap. You can apply CHG directly to the skin and wash gently with a clean washcloth.  ? ?Apply the CHG Soap to your body ONLY FROM THE NECK DOWN.  Do not use on open wounds or open sores. Avoid contact with your eyes, ears, mouth and genitals (private parts). Wash Face and genitals (private parts)  with your normal soap.  ? ?Wash thoroughly, paying special attention to the area where your surgery will be performed. ? ?Thoroughly rinse your body with warm water from the neck down. ? ?DO NOT shower/wash with your normal soap after using and rinsing off the CHG Soap. ? ?Pat yourself dry with a CLEAN TOWEL. ? ?Wear CLEAN PAJAMAS to bed the night before surgery ? ?Place CLEAN SHEETS on your bed the night before your surgery ? ?DO  NOT SLEEP WITH PETS. ? ? ?Day of Surgery: ? ?Take a shower with CHG soap. ?Wear Clean/Comfortable clothing the morning of surgery ?Do not apply any deodorants/lotions.   ?Remember to brush your teeth WITH YOUR REGULAR TOOTHPASTE. ?  ?Please read over the fact sheets that you were given.  ? ?

## 2021-11-03 ENCOUNTER — Encounter (HOSPITAL_COMMUNITY)
Admission: RE | Admit: 2021-11-03 | Discharge: 2021-11-03 | Disposition: A | Discharge status: Home / Self Care | Payer: Medicare Other | Source: Ambulatory Visit | Attending: Vascular Surgery | Admitting: Vascular Surgery

## 2021-11-03 ENCOUNTER — Other Ambulatory Visit: Payer: Self-pay

## 2021-11-03 ENCOUNTER — Encounter (HOSPITAL_COMMUNITY): Payer: Self-pay

## 2021-11-03 VITALS — BP 163/88 | HR 89 | Temp 97.9°F | Resp 17 | Ht 69.0 in | Wt 176.0 lb

## 2021-11-03 DIAGNOSIS — I129 Hypertensive chronic kidney disease with stage 1 through stage 4 chronic kidney disease, or unspecified chronic kidney disease: Secondary | ICD-10-CM | POA: Diagnosis not present

## 2021-11-03 DIAGNOSIS — Z01812 Encounter for preprocedural laboratory examination: Secondary | ICD-10-CM | POA: Diagnosis not present

## 2021-11-03 DIAGNOSIS — I6521 Occlusion and stenosis of right carotid artery: Secondary | ICD-10-CM | POA: Insufficient documentation

## 2021-11-03 DIAGNOSIS — D649 Anemia, unspecified: Secondary | ICD-10-CM | POA: Diagnosis not present

## 2021-11-03 DIAGNOSIS — N189 Chronic kidney disease, unspecified: Secondary | ICD-10-CM | POA: Insufficient documentation

## 2021-11-03 DIAGNOSIS — Z01818 Encounter for other preprocedural examination: Secondary | ICD-10-CM

## 2021-11-03 HISTORY — DX: Prediabetes: R73.03

## 2021-11-03 LAB — TYPE AND SCREEN
ABO/RH(D): A POS
Antibody Screen: NEGATIVE

## 2021-11-03 LAB — COMPREHENSIVE METABOLIC PANEL
ALT: 15 U/L (ref 0–44)
AST: 21 U/L (ref 15–41)
Albumin: 3.8 g/dL (ref 3.5–5.0)
Alkaline Phosphatase: 48 U/L (ref 38–126)
Anion gap: 8 (ref 5–15)
BUN: 25 mg/dL — ABNORMAL HIGH (ref 8–23)
CO2: 23 mmol/L (ref 22–32)
Calcium: 9.2 mg/dL (ref 8.9–10.3)
Chloride: 108 mmol/L (ref 98–111)
Creatinine, Ser: 2.56 mg/dL — ABNORMAL HIGH (ref 0.61–1.24)
GFR, Estimated: 24 mL/min — ABNORMAL LOW (ref >60)
Glucose, Bld: 124 mg/dL — ABNORMAL HIGH (ref 70–99)
Potassium: 4.2 mmol/L (ref 3.5–5.1)
Sodium: 139 mmol/L (ref 135–145)
Total Bilirubin: 0.2 mg/dL — ABNORMAL LOW (ref 0.3–1.2)
Total Protein: 7.1 g/dL (ref 6.5–8.1)

## 2021-11-03 LAB — CBC
HCT: 31.7 % — ABNORMAL LOW (ref 39.0–52.0)
Hemoglobin: 10.5 g/dL — ABNORMAL LOW (ref 13.0–17.0)
MCH: 32.1 pg (ref 26.0–34.0)
MCHC: 33.1 g/dL (ref 30.0–36.0)
MCV: 96.9 fL (ref 80.0–100.0)
Platelets: 173 10*3/uL (ref 150–400)
RBC: 3.27 MIL/uL — ABNORMAL LOW (ref 4.22–5.81)
RDW: 12.7 % (ref 11.5–15.5)
WBC: 3.8 10*3/uL — ABNORMAL LOW (ref 4.0–10.5)
nRBC: 0 % (ref 0.0–0.2)

## 2021-11-03 LAB — SURGICAL PCR SCREEN
MRSA, PCR: NEGATIVE
Staphylococcus aureus: NEGATIVE

## 2021-11-03 LAB — URINALYSIS, ROUTINE W REFLEX MICROSCOPIC
Bilirubin Urine: NEGATIVE
Glucose, UA: NEGATIVE mg/dL
Hgb urine dipstick: NEGATIVE
Ketones, ur: NEGATIVE mg/dL
Leukocytes,Ua: NEGATIVE
Nitrite: NEGATIVE
Protein, ur: NEGATIVE mg/dL
Specific Gravity, Urine: 1.017 (ref 1.005–1.030)
pH: 5 (ref 5.0–8.0)

## 2021-11-03 LAB — APTT: aPTT: 25 seconds (ref 24–36)

## 2021-11-03 LAB — PROTIME-INR
INR: 1 (ref 0.8–1.2)
Prothrombin Time: 12.8 seconds (ref 11.4–15.2)

## 2021-11-03 NOTE — Progress Notes (Signed)
PCP - Dr. Cathlean Cower ?Cardiologist - Dr. Johnsie Cancel ? ?PPM/ICD - n/a ? ?Chest x-ray - n/a ?EKG - 08/25/21 ?Stress Test - denies ?ECHO - 11/20/19 ?Cardiac Cath - denies ? ?Sleep Study - denies ?CPAP - denies ? ?Pt reports being pre-diabetic. Pt does have materials at home to check CBG. Pt only checks CBG 1-2 times a week. ?Last A1C on 08/25/21 was 6.6, pt was not started on any medications nor told to make changes to diet. Pt also not diagnosed with T2DM. Per Myra Gianotti, PA-C, no A1C needed today.  ? ?Blood Thinner Instructions: n/a ?Aspirin Instructions: Continue ASA ? ?NPO at MD ? ?COVID TEST-Scheduled for 11/09/21 at 1030 in PAT.  ? ? ?Anesthesia review: Yes, cardiac clearance received 10/23/21. ? ?Patient denies shortness of breath, fever, cough and chest pain at PAT appointment ? ? ?All instructions explained to the patient, with a verbal understanding of the material. Patient agrees to go over the instructions while at home for a better understanding. Patient also instructed to self quarantine after being tested for COVID-19. The opportunity to ask questions was provided. ? ? ?

## 2021-11-04 NOTE — Progress Notes (Signed)
Anesthesia Chart Review: ? ?Follows with cardiology for history of hypertension, dizziness, palpitations.  He had an event recorder September 2017 with no arrhythmias noted.  He had a repeat echocardiogram ordered by his primary care March 2021 for exertional dyspnea.  EF 55 to 60%, no valvular disease, grade 2 diastolic dysfunction.  Bruit was heard on follow-up 09/30/2021 and carotid Dopplers ordered.  Patient subsequently followed up with Dr. Gardiner Rhyme on 10/13/2021 to review Doppler.  He was noted to have 80 to 90% stenosis in the right carotid and he was referred to vascular surgery.  ? ?In January 2023 patient's PCP ordered stress test for evaluation of chest discomfort.  This test has yet to be performed.  This was noted by cardiology NP Diona Browner in telephone encounter 10/23/2021.  At that time she reached out to Dr. Johnsie Cancel for input on patient's ability to proceed with carotid surgery.  Dr. Johnsie Cancel responded stating, "Okay to proceed with right CEA".  Formal cardiac clearance per telephone encounter 10/26/2021, "Chart reviewed as part of pre-operative protocol coverage. Given past medical history and time since last visit, based on ACC/AHA guidelines, DAMIRE REMEDIOS would be at acceptable risk for the planned procedure without further cardiovascular testing. His RCRI is a class II risk, 0.9% risk of major cardiac event." ?  ?CKD with baseline creatinine around 2.3.  Creatinine 2.56 on preop labs. ? ?Mild anemia on preop labs with hemoglobin 10.5.  Remainder of labs unremarkable. ? ?EKG 08/25/2021: NSR.  Rate 72.  Minimal voltage criteria for LVH, may be normal variant. ? ?Carotid duplex 10/14/2021: ?Summary:  ?Right Carotid: Velocities in the right ICA are consistent with a 80-99% stenosis. Non-hemodynamically significant plaque <50% noted in the CCA. There is heavy plaque burden noted in the proximal ICA; difficulty getting behind plaque. Velocities suggest possible high end range of 60-79% to possible 80-99%  stenosis.  ?Left Carotid: Velocities in the left ICA are consistent with a 1-39% stenosis. Non-hemodynamically significant plaque <50% noted in the CCA.  ?Vertebrals:  Bilateral vertebral arteries demonstrate antegrade flow.  ?Subclavians: Normal flow hemodynamics were seen in bilateral subclavian arteries.  ? ?Event monitor 10/12/2021: ?Patient had a min HR of 53 bpm, max HR of 214 bpm, and avg HR of 89 bpm. Predominant underlying rhythm was Sinus Rhythm. First Degree AV Block was present. 103 Supraventricular Tachycardia runs occurred, the run with the fastest interval lasting 11 beats ? with a max rate of 214 bpm, the longest lasting 12.8 secs with an avg rate of 125 bpm. Some episodes of Supraventricular Tachycardia may be possible Atrial Tachycardia with variable block. Isolated SVEs were occasional (2.2%, 19911), SVE Couplets were  ?rare (<1.0%, 1277), and SVE Triplets were rare (<1.0%, 498). Isolated VEs were rare (<1.0%), VE Couplets were rare (<1.0%), and no VE Triplets were present. Ventricular Trigeminy was present. ? ?TTE 11/20/2019: ?1. Left ventricular ejection fraction, by estimation, is 55 to 60%. Left  ?ventricular ejection fraction by 3D volume is 56 %. The left ventricle has  ?normal function. The left ventricle has no regional wall motion  ?abnormalities. There is mild asymmetric  ?left ventricular hypertrophy of the basal-septal segment. Left ventricular  ?diastolic parameters are consistent with Grade II diastolic dysfunction  ?(pseudonormalization). Elevated left atrial pressure. The average left  ?ventricular global longitudinal  ?strain is -17.3 %.  ? 2. Right ventricular systolic function is normal. The right ventricular  ?size is normal. Tricuspid regurgitation signal is inadequate for assessing  ?PA pressure.  ? 3.  Left atrial size was mildly dilated.  ? 4. The mitral valve is normal in structure. Moderate mitral annular  ?calcification. Trivial mitral valve regurgitation.  ? 5. The aortic  valve is tricuspid. Aortic valve regurgitation is not  ?visualized. Mild aortic valve sclerosis is present, with no evidence of  ?aortic valve stenosis.  ? 6. Aortic dilatation noted. There is mild dilatation of the ascending  ?aorta measuring 37 mm.  ? 7. The inferior vena cava is normal in size with greater than 50%  ?respiratory variability, suggesting right atrial pressure of 3 mmHg.  ? ?Comparison(s): 04/30/16 EF 60-65%. PA pressure 63mHg.  ?  ? ? ? ?JKaroline Caldwell PA-C ?[facility] ?Phone [phone]?11/04/2021 4:25 PM ? ?

## 2021-11-04 NOTE — Anesthesia Preprocedure Evaluation (Signed)
Anesthesia Evaluation    Airway        Dental   Pulmonary former smoker,           Cardiovascular hypertension,      Neuro/Psych    GI/Hepatic   Endo/Other    Renal/GU      Musculoskeletal   Abdominal   Peds  Hematology   Anesthesia Other Findings   Reproductive/Obstetrics                             Anesthesia Physical Anesthesia Plan  ASA:   Anesthesia Plan:    Post-op Pain Management:    Induction:   PONV Risk Score and Plan:   Airway Management Planned:   Additional Equipment:   Intra-op Plan:   Post-operative Plan:   Informed Consent:   Plan Discussed with:   Anesthesia Plan Comments: (PAT note by Greg Caldwell, PA-C: Follows with cardiology for history of hypertension, dizziness, palpitations. He had an event recorder September 2017 with no arrhythmias noted. He had a repeat echocardiogram ordered by his primary care March 2021 for exertional dyspnea. EF 55 to 60%, no valvular disease, grade 2 diastolic dysfunction.  Bruit was heard on follow-up 09/30/2021 and carotid Dopplers ordered.  Patient subsequently followed up with Dr. Gardiner Rhyme on 10/13/2021 to review Doppler.  He was noted to have 80 to 90% stenosis in the right carotid and he was referred to vascular surgery.   In January 2023 patient's PCP ordered stress test for evaluation of chest discomfort.  This test has yet to be performed.  This was noted by cardiology NP Diona Browner in telephone encounter 10/23/2021.  At that time she reached out to Dr. Johnsie Cancel for input on patient's ability to proceed with carotid surgery.  Dr. Johnsie Cancel responded stating, "Okay to proceed with right CEA".  Formal cardiac clearance per telephone encounter 10/26/2021, "Chart reviewed as part of pre-operative protocol coverage. Given past medical history and time since last visit, based on ACC/AHA guidelines,Greg J Burchwould be at acceptable risk  for the planned procedure without further cardiovascular testing. His RCRI is a class II risk, 0.9% risk of major cardiac event."  CKD with baseline creatinine around 2.3.  Creatinine 2.56 on preop labs.  Mild anemia on preop labs with hemoglobin 10.5.  Remainder of labs unremarkable.  EKG 08/25/2021: NSR.  Rate 72.  Minimal voltage criteria for LVH, may be normal variant.  Carotid duplex 10/14/2021: Summary:  Right Carotid: Velocities in the right ICA are consistent with a 80-99% stenosis. Non-hemodynamically significant plaque <50% noted in the CCA. There is heavy plaque burden noted in the proximal ICA; difficulty getting behind plaque. Velocities suggest possible high end range of 60-79% to possible 80-99% stenosis.  Left Carotid: Velocities in the left ICA are consistent with a 1-39% stenosis. Non-hemodynamically significant plaque <50% noted in the CCA.  Vertebrals: Bilateral vertebral arteries demonstrate antegrade flow.  Subclavians: Normal flow hemodynamics were seen in bilateral subclavian arteries.   Event monitor 10/12/2021: Patient had a min HR of 53 bpm, max HR of 214 bpm, and avg HR of 89 bpm. Predominant underlying rhythm was Sinus Rhythm. First Degree AV Block was present. 103 Supraventricular Tachycardia runs occurred, the run with the fastest interval lasting 11 beats with a max rate of 214 bpm, the longest lasting 12.8 secs with an avg rate of 125 bpm. Some episodes of Supraventricular Tachycardia may be possible Atrial Tachycardia with variable block. Isolated SVEs were  occasional (2.2%, 19911), SVE Couplets were  rare (<1.0%, 1277), and SVE Triplets were rare (<1.0%, 498). Isolated VEs were rare (<1.0%), VE Couplets were rare (<1.0%), and no VE Triplets were present. Ventricular Trigeminy was present.  TTE 11/20/2019: 1. Left ventricular ejection fraction, by estimation, is 55 to 60%. Left  ventricular ejection fraction by 3D volume is 56 %. The left ventricle has  normal  function. The left ventricle has no regional wall motion  abnormalities. There is mild asymmetric  left ventricular hypertrophy of the basal-septal segment. Left ventricular  diastolic parameters are consistent with Grade II diastolic dysfunction  (pseudonormalization). Elevated left atrial pressure. The average left  ventricular global longitudinal  strain is -17.3 %.  2. Right ventricular systolic function is normal. The right ventricular  size is normal. Tricuspid regurgitation signal is inadequate for assessing  PA pressure.  3. Left atrial size was mildly dilated.  4. The mitral valve is normal in structure. Moderate mitral annular  calcification. Trivial mitral valve regurgitation.  5. The aortic valve is tricuspid. Aortic valve regurgitation is not  visualized. Mild aortic valve sclerosis is present, with no evidence of  aortic valve stenosis.  6. Aortic dilatation noted. There is mild dilatation of the ascending  aorta measuring 37 mm.  7. The inferior vena cava is normal in size with greater than 50%  respiratory variability, suggesting right atrial pressure of 3 mmHg.   Comparison(s): 04/30/16 EF 60-65%. PA pressure 55mHg.    )        Anesthesia Quick Evaluation

## 2021-11-05 DIAGNOSIS — S52522A Torus fracture of lower end of left radius, initial encounter for closed fracture: Secondary | ICD-10-CM | POA: Diagnosis not present

## 2021-11-09 ENCOUNTER — Encounter: Payer: Self-pay | Admitting: Internal Medicine

## 2021-11-09 ENCOUNTER — Other Ambulatory Visit: Payer: Self-pay

## 2021-11-09 ENCOUNTER — Other Ambulatory Visit (HOSPITAL_COMMUNITY)
Admission: RE | Admit: 2021-11-09 | Discharge: 2021-11-09 | Disposition: A | Discharge status: Home / Self Care | Payer: Medicare Other | Source: Ambulatory Visit | Attending: Vascular Surgery | Admitting: Vascular Surgery

## 2021-11-09 ENCOUNTER — Telehealth: Payer: Self-pay | Admitting: Internal Medicine

## 2021-11-09 ENCOUNTER — Ambulatory Visit (INDEPENDENT_AMBULATORY_CARE_PROVIDER_SITE_OTHER): Payer: Medicare Other | Admitting: Internal Medicine

## 2021-11-09 VITALS — BP 132/70 | HR 107 | Temp 99.1°F | Ht 69.0 in | Wt 178.0 lb

## 2021-11-09 DIAGNOSIS — E559 Vitamin D deficiency, unspecified: Secondary | ICD-10-CM | POA: Diagnosis not present

## 2021-11-09 DIAGNOSIS — Z79899 Other long term (current) drug therapy: Secondary | ICD-10-CM | POA: Diagnosis not present

## 2021-11-09 DIAGNOSIS — I10 Essential (primary) hypertension: Secondary | ICD-10-CM | POA: Diagnosis not present

## 2021-11-09 DIAGNOSIS — D638 Anemia in other chronic diseases classified elsewhere: Secondary | ICD-10-CM | POA: Diagnosis not present

## 2021-11-09 DIAGNOSIS — N400 Enlarged prostate without lower urinary tract symptoms: Secondary | ICD-10-CM | POA: Diagnosis present

## 2021-11-09 DIAGNOSIS — E7800 Pure hypercholesterolemia, unspecified: Secondary | ICD-10-CM

## 2021-11-09 DIAGNOSIS — Z79890 Hormone replacement therapy (postmenopausal): Secondary | ICD-10-CM | POA: Diagnosis not present

## 2021-11-09 DIAGNOSIS — E039 Hypothyroidism, unspecified: Secondary | ICD-10-CM | POA: Diagnosis not present

## 2021-11-09 DIAGNOSIS — R7309 Other abnormal glucose: Secondary | ICD-10-CM | POA: Diagnosis not present

## 2021-11-09 DIAGNOSIS — M1990 Unspecified osteoarthritis, unspecified site: Secondary | ICD-10-CM | POA: Diagnosis not present

## 2021-11-09 DIAGNOSIS — Z20822 Contact with and (suspected) exposure to covid-19: Secondary | ICD-10-CM | POA: Diagnosis not present

## 2021-11-09 DIAGNOSIS — Z7982 Long term (current) use of aspirin: Secondary | ICD-10-CM | POA: Diagnosis not present

## 2021-11-09 DIAGNOSIS — Z96652 Presence of left artificial knee joint: Secondary | ICD-10-CM | POA: Diagnosis not present

## 2021-11-09 DIAGNOSIS — Z841 Family history of disorders of kidney and ureter: Secondary | ICD-10-CM | POA: Diagnosis not present

## 2021-11-09 DIAGNOSIS — K219 Gastro-esophageal reflux disease without esophagitis: Secondary | ICD-10-CM | POA: Diagnosis not present

## 2021-11-09 DIAGNOSIS — Z809 Family history of malignant neoplasm, unspecified: Secondary | ICD-10-CM | POA: Diagnosis not present

## 2021-11-09 DIAGNOSIS — H5461 Unqualified visual loss, right eye, normal vision left eye: Secondary | ICD-10-CM | POA: Diagnosis not present

## 2021-11-09 DIAGNOSIS — Z888 Allergy status to other drugs, medicaments and biological substances status: Secondary | ICD-10-CM | POA: Diagnosis not present

## 2021-11-09 DIAGNOSIS — H409 Unspecified glaucoma: Secondary | ICD-10-CM | POA: Diagnosis not present

## 2021-11-09 DIAGNOSIS — Z87891 Personal history of nicotine dependence: Secondary | ICD-10-CM | POA: Diagnosis not present

## 2021-11-09 DIAGNOSIS — I6521 Occlusion and stenosis of right carotid artery: Secondary | ICD-10-CM | POA: Diagnosis not present

## 2021-11-09 LAB — SARS CORONAVIRUS 2 (TAT 6-24 HRS): SARS Coronavirus 2: NEGATIVE

## 2021-11-09 MED ORDER — ZOSTER VAC RECOMB ADJUVANTED 50 MCG/0.5ML IM SUSR: 0.5000 mL | Freq: Once | INTRAMUSCULAR | 1 refills | Status: AC

## 2021-11-09 NOTE — Telephone Encounter (Signed)
Ok this was sent to [facility] ?
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
## 2021-11-09 NOTE — Progress Notes (Signed)
Pt updated with surgery time change 0830-1045, arrival 0630. ?

## 2021-11-09 NOTE — Assessment & Plan Note (Signed)
BP Readings from Last 3 Encounters:  ?11/09/21 132/70  ?11/03/21 (!) 163/88  ?10/22/21 137/80  ? ?Stable, pt to continue medical treatment norvasc, coreg ? ?

## 2021-11-09 NOTE — Assessment & Plan Note (Signed)
Lab Results  ?Component Value Date  ? [place] 74 08/25/2021  ? ?Mild uncontrolled, pt to continue current statin zocor 40, declines change today ? ?

## 2021-11-09 NOTE — Assessment & Plan Note (Signed)
Lab Results  ?Component Value Date  ? HGBA1C 6.6 (H) 08/25/2021  ? ?Stable, pt to continue current medical treatment  - diet ? ?

## 2021-11-09 NOTE — Assessment & Plan Note (Signed)
Last vitamin D Lab Results  Component Value Date   VD25OH 49.18 05/11/2021   Stable, cont oral replacement  

## 2021-11-09 NOTE — Patient Instructions (Signed)
Please continue all other medications as before, and refills have been done if requested. ? ?Please have the pharmacy call with any other refills you may need. ? ?Please continue your efforts at being more active, low cholesterol diet, and weight control. ? ?You are otherwise up to date with prevention measures today. ? ?Please keep your appointments with your specialists as you may have planned ? ?Greg [name] with your surgury tomorrow! ? ?Please make an Appointment to return in 6 months, or sooner if needed ?

## 2021-11-09 NOTE — Telephone Encounter (Signed)
Pt requesting a rx for a shingle injection sent to: ? ?[facility] [phone] - [place], [place] ?

## 2021-11-10 ENCOUNTER — Inpatient Hospital Stay (HOSPITAL_COMMUNITY): Payer: Medicare Other | Admitting: Physician Assistant

## 2021-11-10 ENCOUNTER — Other Ambulatory Visit: Payer: Self-pay

## 2021-11-10 ENCOUNTER — Inpatient Hospital Stay (HOSPITAL_COMMUNITY)
Admission: RE | Admit: 2021-11-10 | Discharge: 2021-11-11 | DRG: 039 | Disposition: A | Discharge status: Home / Self Care | Payer: Medicare Other | Source: Ambulatory Visit | Attending: Vascular Surgery | Admitting: Vascular Surgery

## 2021-11-10 ENCOUNTER — Inpatient Hospital Stay (HOSPITAL_COMMUNITY): Payer: Medicare Other | Admitting: Certified Registered"

## 2021-11-10 ENCOUNTER — Encounter (HOSPITAL_COMMUNITY)
Admission: RE | Disposition: A | Discharge status: Home / Self Care | Payer: Self-pay | Source: Ambulatory Visit | Attending: Vascular Surgery

## 2021-11-10 ENCOUNTER — Encounter (HOSPITAL_COMMUNITY): Payer: Self-pay | Admitting: Vascular Surgery

## 2021-11-10 DIAGNOSIS — E7800 Pure hypercholesterolemia, unspecified: Secondary | ICD-10-CM | POA: Diagnosis present

## 2021-11-10 DIAGNOSIS — Z20822 Contact with and (suspected) exposure to covid-19: Secondary | ICD-10-CM | POA: Diagnosis present

## 2021-11-10 DIAGNOSIS — H409 Unspecified glaucoma: Secondary | ICD-10-CM | POA: Diagnosis present

## 2021-11-10 DIAGNOSIS — Z888 Allergy status to other drugs, medicaments and biological substances status: Secondary | ICD-10-CM | POA: Diagnosis not present

## 2021-11-10 DIAGNOSIS — K219 Gastro-esophageal reflux disease without esophagitis: Secondary | ICD-10-CM | POA: Diagnosis present

## 2021-11-10 DIAGNOSIS — M1990 Unspecified osteoarthritis, unspecified site: Secondary | ICD-10-CM | POA: Diagnosis present

## 2021-11-10 DIAGNOSIS — N400 Enlarged prostate without lower urinary tract symptoms: Secondary | ICD-10-CM | POA: Diagnosis present

## 2021-11-10 DIAGNOSIS — Z96652 Presence of left artificial knee joint: Secondary | ICD-10-CM | POA: Diagnosis present

## 2021-11-10 DIAGNOSIS — Z87891 Personal history of nicotine dependence: Secondary | ICD-10-CM

## 2021-11-10 DIAGNOSIS — I6521 Occlusion and stenosis of right carotid artery: Secondary | ICD-10-CM | POA: Diagnosis not present

## 2021-11-10 DIAGNOSIS — D638 Anemia in other chronic diseases classified elsewhere: Secondary | ICD-10-CM | POA: Diagnosis not present

## 2021-11-10 DIAGNOSIS — I10 Essential (primary) hypertension: Secondary | ICD-10-CM | POA: Diagnosis present

## 2021-11-10 DIAGNOSIS — Z841 Family history of disorders of kidney and ureter: Secondary | ICD-10-CM | POA: Diagnosis not present

## 2021-11-10 DIAGNOSIS — H5461 Unqualified visual loss, right eye, normal vision left eye: Secondary | ICD-10-CM | POA: Diagnosis present

## 2021-11-10 DIAGNOSIS — Z01818 Encounter for other preprocedural examination: Secondary | ICD-10-CM

## 2021-11-10 DIAGNOSIS — Z809 Family history of malignant neoplasm, unspecified: Secondary | ICD-10-CM | POA: Diagnosis not present

## 2021-11-10 DIAGNOSIS — I6529 Occlusion and stenosis of unspecified carotid artery: Secondary | ICD-10-CM | POA: Diagnosis present

## 2021-11-10 DIAGNOSIS — E039 Hypothyroidism, unspecified: Secondary | ICD-10-CM | POA: Diagnosis not present

## 2021-11-10 DIAGNOSIS — Z79899 Other long term (current) drug therapy: Secondary | ICD-10-CM | POA: Diagnosis not present

## 2021-11-10 DIAGNOSIS — Z79890 Hormone replacement therapy (postmenopausal): Secondary | ICD-10-CM | POA: Diagnosis not present

## 2021-11-10 DIAGNOSIS — Z7982 Long term (current) use of aspirin: Secondary | ICD-10-CM | POA: Diagnosis not present

## 2021-11-10 HISTORY — PX: ENDARTERECTOMY: SHX5162

## 2021-11-10 LAB — POCT ACTIVATED CLOTTING TIME: Activated Clotting Time: 245 seconds

## 2021-11-10 SURGERY — ENDARTERECTOMY, CAROTID
Anesthesia: General | Site: Neck | Laterality: Right

## 2021-11-10 MED ORDER — ACETAMINOPHEN 650 MG RE SUPP: 325.0000 mg | RECTAL | Status: DC | PRN

## 2021-11-10 MED ORDER — PANTOPRAZOLE SODIUM 40 MG PO TBEC
40.0000 mg | DELAYED_RELEASE_TABLET | Freq: Every day | ORAL | Status: DC
  Administered 2021-11-10 – 2021-11-11 (×2): 40 mg via ORAL
  Filled 2021-11-10 (×2): qty 1

## 2021-11-10 MED ORDER — MAGNESIUM SULFATE 2 GM/50ML IV SOLN: 2.0000 g | Freq: Every day | INTRAVENOUS | Status: DC | PRN

## 2021-11-10 MED ORDER — ASPIRIN EC 81 MG PO TBEC
81.0000 mg | DELAYED_RELEASE_TABLET | Freq: Every day | ORAL | Status: DC
  Administered 2021-11-10 – 2021-11-11 (×2): 81 mg via ORAL
  Filled 2021-11-10 (×2): qty 1

## 2021-11-10 MED ORDER — HEPARIN SODIUM (PORCINE) 1000 UNIT/ML IJ SOLN
INTRAMUSCULAR | Status: DC | PRN
  Administered 2021-11-10: 8000 [IU] via INTRAVENOUS
  Administered 2021-11-10: 2000 [IU] via INTRAVENOUS

## 2021-11-10 MED ORDER — ALUM & MAG HYDROXIDE-SIMETH 200-200-20 MG/5ML PO SUSP: 15.0000 mL | ORAL | Status: DC | PRN

## 2021-11-10 MED ORDER — LEVOTHYROXINE SODIUM 50 MCG PO TABS
50.0000 ug | ORAL_TABLET | Freq: Every day | ORAL | Status: DC
  Administered 2021-11-11: 50 ug via ORAL
  Filled 2021-11-10: qty 1

## 2021-11-10 MED ORDER — METOPROLOL TARTRATE 5 MG/5ML IV SOLN: 2.0000 mg | INTRAVENOUS | Status: DC | PRN

## 2021-11-10 MED ORDER — ALBUMIN HUMAN 5 % IV SOLN
12.5000 g | Freq: Once | INTRAVENOUS | Status: AC
Start: 2021-11-10 — End: 2021-11-10
  Administered 2021-11-10: 12.5 g via INTRAVENOUS

## 2021-11-10 MED ORDER — CHLORHEXIDINE GLUCONATE CLOTH 2 % EX PADS: 6.0000 | MEDICATED_PAD | Freq: Once | CUTANEOUS | Status: DC

## 2021-11-10 MED ORDER — SIMVASTATIN 20 MG PO TABS
40.0000 mg | ORAL_TABLET | Freq: Every day | ORAL | Status: DC
  Administered 2021-11-10: 40 mg via ORAL
  Filled 2021-11-10: qty 2

## 2021-11-10 MED ORDER — EPHEDRINE 5 MG/ML INJ
INTRAVENOUS | Status: AC
  Filled 2021-11-10: qty 5

## 2021-11-10 MED ORDER — ALBUMIN HUMAN 5 % IV SOLN
INTRAVENOUS | Status: AC
  Filled 2021-11-10: qty 250

## 2021-11-10 MED ORDER — ONDANSETRON HCL 4 MG/2ML IJ SOLN
INTRAMUSCULAR | Status: DC | PRN
  Administered 2021-11-10: 4 mg via INTRAVENOUS

## 2021-11-10 MED ORDER — DEXAMETHASONE SODIUM PHOSPHATE 10 MG/ML IJ SOLN
INTRAMUSCULAR | Status: AC
  Filled 2021-11-10: qty 1

## 2021-11-10 MED ORDER — PHENYLEPHRINE 40 MCG/ML (10ML) SYRINGE FOR IV PUSH (FOR BLOOD PRESSURE SUPPORT)
PREFILLED_SYRINGE | INTRAVENOUS | Status: AC
  Filled 2021-11-10: qty 10

## 2021-11-10 MED ORDER — ORAL CARE MOUTH RINSE: 15.0000 mL | Freq: Once | OROMUCOSAL | Status: AC

## 2021-11-10 MED ORDER — PHENYLEPHRINE HCL-NACL 20-0.9 MG/250ML-% IV SOLN
INTRAVENOUS | Status: DC | PRN
  Administered 2021-11-10: 25 ug/min via INTRAVENOUS

## 2021-11-10 MED ORDER — DEXMEDETOMIDINE (PRECEDEX) IN NS 20 MCG/5ML (4 MCG/ML) IV SYRINGE
PREFILLED_SYRINGE | INTRAVENOUS | Status: DC | PRN
  Administered 2021-11-10: 8 ug via INTRAVENOUS

## 2021-11-10 MED ORDER — ROCURONIUM BROMIDE 10 MG/ML (PF) SYRINGE
PREFILLED_SYRINGE | INTRAVENOUS | Status: DC | PRN
Start: 2021-11-10 — End: 2021-11-10
  Administered 2021-11-10: 50 mg via INTRAVENOUS
  Administered 2021-11-10: 10 mg via INTRAVENOUS

## 2021-11-10 MED ORDER — CEFAZOLIN SODIUM-DEXTROSE 2-4 GM/100ML-% IV SOLN
2.0000 g | INTRAVENOUS | Status: AC
  Administered 2021-11-10: 2 g via INTRAVENOUS
  Filled 2021-11-10: qty 100

## 2021-11-10 MED ORDER — LIDOCAINE 2% (20 MG/ML) 5 ML SYRINGE
INTRAMUSCULAR | Status: AC
  Filled 2021-11-10: qty 5

## 2021-11-10 MED ORDER — HEPARIN SODIUM (PORCINE) 1000 UNIT/ML IJ SOLN
INTRAMUSCULAR | Status: AC
  Filled 2021-11-10: qty 10

## 2021-11-10 MED ORDER — EPHEDRINE SULFATE-NACL 50-0.9 MG/10ML-% IV SOSY
PREFILLED_SYRINGE | INTRAVENOUS | Status: DC | PRN
Start: 2021-11-10 — End: 2021-11-10
  Administered 2021-11-10 (×2): 5 mg via INTRAVENOUS

## 2021-11-10 MED ORDER — SODIUM CHLORIDE 0.9 % IV SOLN
0.0125 ug/kg/min | INTRAVENOUS | Status: AC
  Administered 2021-11-10: .2 ug/kg/min via INTRAVENOUS
  Filled 2021-11-10: qty 2000

## 2021-11-10 MED ORDER — CHLORHEXIDINE GLUCONATE CLOTH 2 % EX PADS
6.0000 | MEDICATED_PAD | Freq: Once | CUTANEOUS | Status: DC
Start: 2021-11-10 — End: 2021-11-10

## 2021-11-10 MED ORDER — HYDRALAZINE HCL 20 MG/ML IJ SOLN: 5.0000 mg | INTRAMUSCULAR | Status: DC | PRN

## 2021-11-10 MED ORDER — MORPHINE SULFATE (PF) 2 MG/ML IV SOLN
2.0000 mg | INTRAVENOUS | Status: DC | PRN
  Administered 2021-11-10: 2 mg via INTRAVENOUS
  Filled 2021-11-10: qty 1

## 2021-11-10 MED ORDER — ACETAMINOPHEN 325 MG PO TABS: 325.0000 mg | ORAL_TABLET | ORAL | Status: DC | PRN

## 2021-11-10 MED ORDER — OXYCODONE-ACETAMINOPHEN 5-325 MG PO TABS
1.0000 | ORAL_TABLET | ORAL | Status: DC | PRN
  Administered 2021-11-10: 1 via ORAL
  Filled 2021-11-10: qty 1

## 2021-11-10 MED ORDER — SODIUM CHLORIDE 0.9 % IV SOLN: INTRAVENOUS | Status: DC

## 2021-11-10 MED ORDER — ROCURONIUM BROMIDE 10 MG/ML (PF) SYRINGE
PREFILLED_SYRINGE | INTRAVENOUS | Status: AC
  Filled 2021-11-10: qty 10

## 2021-11-10 MED ORDER — CHLORHEXIDINE GLUCONATE 0.12 % MT SOLN
15.0000 mL | Freq: Once | OROMUCOSAL | Status: AC
  Administered 2021-11-10: 15 mL via OROMUCOSAL
  Filled 2021-11-10: qty 15

## 2021-11-10 MED ORDER — LABETALOL HCL 5 MG/ML IV SOLN: 10.0000 mg | INTRAVENOUS | Status: DC | PRN

## 2021-11-10 MED ORDER — HEMOSTATIC AGENTS (NO CHARGE) OPTIME
TOPICAL | Status: DC | PRN
  Administered 2021-11-10: 1 via TOPICAL

## 2021-11-10 MED ORDER — ONDANSETRON HCL 4 MG/2ML IJ SOLN
INTRAMUSCULAR | Status: AC
  Filled 2021-11-10: qty 2

## 2021-11-10 MED ORDER — POTASSIUM CHLORIDE CRYS ER 20 MEQ PO TBCR: 20.0000 meq | EXTENDED_RELEASE_TABLET | Freq: Every day | ORAL | Status: DC | PRN

## 2021-11-10 MED ORDER — CARVEDILOL 3.125 MG PO TABS
3.1250 mg | ORAL_TABLET | Freq: Two times a day (BID) | ORAL | Status: DC
  Administered 2021-11-10 – 2021-11-11 (×2): 3.125 mg via ORAL
  Filled 2021-11-10 (×2): qty 1

## 2021-11-10 MED ORDER — GUAIFENESIN-DM 100-10 MG/5ML PO SYRP: 15.0000 mL | ORAL_SOLUTION | ORAL | Status: DC | PRN

## 2021-11-10 MED ORDER — FENTANYL CITRATE (PF) 100 MCG/2ML IJ SOLN
INTRAMUSCULAR | Status: AC
  Filled 2021-11-10: qty 2

## 2021-11-10 MED ORDER — DROPERIDOL 2.5 MG/ML IJ SOLN: 0.6250 mg | Freq: Once | INTRAMUSCULAR | Status: DC | PRN

## 2021-11-10 MED ORDER — ONDANSETRON HCL 4 MG/2ML IJ SOLN: 4.0000 mg | Freq: Four times a day (QID) | INTRAMUSCULAR | Status: DC | PRN

## 2021-11-10 MED ORDER — DEXAMETHASONE SODIUM PHOSPHATE 10 MG/ML IJ SOLN
INTRAMUSCULAR | Status: DC | PRN
  Administered 2021-11-10: 5 mg via INTRAVENOUS

## 2021-11-10 MED ORDER — PHENOL 1.4 % MT LIQD: 1.0000 | OROMUCOSAL | Status: DC | PRN

## 2021-11-10 MED ORDER — PROPOFOL 10 MG/ML IV BOLUS
INTRAVENOUS | Status: DC | PRN
  Administered 2021-11-10: 60 mg via INTRAVENOUS
  Administered 2021-11-10: 70 mg via INTRAVENOUS

## 2021-11-10 MED ORDER — FENTANYL CITRATE (PF) 100 MCG/2ML IJ SOLN
25.0000 ug | INTRAMUSCULAR | Status: DC | PRN
  Administered 2021-11-10 (×2): 25 ug via INTRAVENOUS

## 2021-11-10 MED ORDER — PROTAMINE SULFATE 10 MG/ML IV SOLN
INTRAVENOUS | Status: DC | PRN
Start: 2021-11-10 — End: 2021-11-10
  Administered 2021-11-10: 50 mg via INTRAVENOUS

## 2021-11-10 MED ORDER — PROPOFOL 10 MG/ML IV BOLUS
INTRAVENOUS | Status: AC
  Filled 2021-11-10: qty 20

## 2021-11-10 MED ORDER — HEPARIN 6000 UNIT IRRIGATION SOLUTION
Status: DC | PRN
  Administered 2021-11-10: 1

## 2021-11-10 MED ORDER — HEPARIN 6000 UNIT IRRIGATION SOLUTION
Status: AC
  Filled 2021-11-10: qty 500

## 2021-11-10 MED ORDER — PHENYLEPHRINE 40 MCG/ML (10ML) SYRINGE FOR IV PUSH (FOR BLOOD PRESSURE SUPPORT)
PREFILLED_SYRINGE | INTRAVENOUS | Status: DC | PRN
  Administered 2021-11-10 (×3): 40 ug via INTRAVENOUS
  Administered 2021-11-10: 80 ug via INTRAVENOUS

## 2021-11-10 MED ORDER — LIDOCAINE 2% (20 MG/ML) 5 ML SYRINGE
INTRAMUSCULAR | Status: DC | PRN
  Administered 2021-11-10: 60 mg via INTRAVENOUS

## 2021-11-10 MED ORDER — DOCUSATE SODIUM 100 MG PO CAPS
100.0000 mg | ORAL_CAPSULE | Freq: Every day | ORAL | Status: DC
  Filled 2021-11-10: qty 1

## 2021-11-10 MED ORDER — FENTANYL CITRATE (PF) 250 MCG/5ML IJ SOLN
INTRAMUSCULAR | Status: AC
  Filled 2021-11-10: qty 5

## 2021-11-10 MED ORDER — LIDOCAINE HCL 1 % IJ SOLN
INTRAMUSCULAR | Status: AC
  Filled 2021-11-10: qty 20

## 2021-11-10 MED ORDER — TAMSULOSIN HCL 0.4 MG PO CAPS
0.4000 mg | ORAL_CAPSULE | Freq: Every day | ORAL | Status: DC
  Administered 2021-11-11: 0.4 mg via ORAL
  Filled 2021-11-10: qty 1

## 2021-11-10 MED ORDER — 0.9 % SODIUM CHLORIDE (POUR BTL) OPTIME
TOPICAL | Status: DC | PRN
  Administered 2021-11-10: 1000 mL

## 2021-11-10 MED ORDER — CEFAZOLIN SODIUM-DEXTROSE 2-4 GM/100ML-% IV SOLN
2.0000 g | Freq: Three times a day (TID) | INTRAVENOUS | Status: AC
  Administered 2021-11-10 (×2): 2 g via INTRAVENOUS
  Filled 2021-11-10 (×2): qty 100

## 2021-11-10 MED ORDER — AMLODIPINE BESYLATE 10 MG PO TABS
10.0000 mg | ORAL_TABLET | Freq: Every day | ORAL | Status: DC
  Administered 2021-11-11: 10 mg via ORAL
  Filled 2021-11-10: qty 1

## 2021-11-10 SURGICAL SUPPLY — 36 items
BAG COUNTER SPONGE SURGICOUNT (BAG) ×2 IMPLANT
BAG DECANTER FOR FLEXI CONT (MISCELLANEOUS) ×2 IMPLANT
BAG SPNG CNTER NS LX DISP (BAG) ×1
CANISTER SUCT 3000ML PPV (MISCELLANEOUS) ×2 IMPLANT
CATH ROBINSON RED A/P 18FR (CATHETERS) ×2 IMPLANT
CLIP LIGATING EXTRA MED SLVR (CLIP) ×2 IMPLANT
CLIP LIGATING EXTRA SM BLUE (MISCELLANEOUS) ×2 IMPLANT
COVER PROBE W GEL 5X96 (DRAPES) ×2 IMPLANT
DERMABOND ADVANCED (GAUZE/BANDAGES/DRESSINGS) ×1
DERMABOND ADVANCED .7 DNX12 (GAUZE/BANDAGES/DRESSINGS) ×1 IMPLANT
ELECT REM PT RETURN 9FT ADLT (ELECTROSURGICAL) ×2
ELECTRODE REM PT RTRN 9FT ADLT (ELECTROSURGICAL) ×1 IMPLANT
GAUZE 4X4 16PLY ~~LOC~~+RFID DBL (SPONGE) ×2 IMPLANT
GLOVE SURG ENC MOIS LTX SZ7.5 (GLOVE) ×2 IMPLANT
GOWN STRL REUS W/ TWL LRG LVL3 (GOWN DISPOSABLE) ×2 IMPLANT
GOWN STRL REUS W/ TWL XL LVL3 (GOWN DISPOSABLE) ×1 IMPLANT
GOWN STRL REUS W/TWL LRG LVL3 (GOWN DISPOSABLE) ×4
GOWN STRL REUS W/TWL XL LVL3 (GOWN DISPOSABLE) ×2
HEMOSTAT SNOW SURGICEL 2X4 (HEMOSTASIS) IMPLANT
KIT BASIN OR (CUSTOM PROCEDURE TRAY) ×2 IMPLANT
KIT SHUNT ARGYLE CAROTID ART 6 (VASCULAR PRODUCTS) ×2 IMPLANT
KIT TURNOVER KIT B (KITS) ×2 IMPLANT
NS IRRIG 1000ML POUR BTL (IV SOLUTION) ×6 IMPLANT
PACK CAROTID (CUSTOM PROCEDURE TRAY) ×2 IMPLANT
PAD ARMBOARD 7.5X6 YLW CONV (MISCELLANEOUS) ×4 IMPLANT
PATCH VASC XENOSURE 1CMX6CM (Vascular Products) ×2 IMPLANT
PATCH VASC XENOSURE 1X6 (Vascular Products) IMPLANT
POSITIONER HEAD DONUT 9IN (MISCELLANEOUS) ×2 IMPLANT
POWDER SURGICEL 3.0 GRAM (HEMOSTASIS) ×1 IMPLANT
SUT MNCRL AB 4-0 PS2 18 (SUTURE) ×2 IMPLANT
SUT PROLENE 6 0 BV (SUTURE) ×6 IMPLANT
SUT PROLENE 7 0 BV1 MDA (SUTURE) ×1 IMPLANT
SUT VIC AB 3-0 SH 27 (SUTURE) ×2
SUT VIC AB 3-0 SH 27X BRD (SUTURE) ×1 IMPLANT
TOWEL GREEN STERILE (TOWEL DISPOSABLE) ×2 IMPLANT
WATER STERILE IRR 1000ML POUR (IV SOLUTION) ×2 IMPLANT

## 2021-11-10 NOTE — Transfer of Care (Signed)
Immediate Anesthesia Transfer of Care Note ? ?Patient: YASH CACCIOLA ? ?Procedure(s) Performed: RIGHT CAROTID ENDARTERECTOMY (Right: Neck) ? ?Patient Location: PACU ? ?Anesthesia Type:General ? ?Level of Consciousness: awake, drowsy and patient cooperative ? ?Airway & Oxygen Therapy: Patient Spontanous Breathing and Patient connected to nasal cannula oxygen ? ?Post-op Assessment: Report given to RN, Post -op Vital signs reviewed and stable and Patient moving all extremities X 4 ? ?Post vital signs: Reviewed and stable ? ?Last Vitals:  ?Vitals Value Taken Time  ?BP 105/57 11/10/21 1050  ?Temp    ?Pulse 71 11/10/21 1054  ?Resp 10 11/10/21 1054  ?SpO2 100 % 11/10/21 1054  ?Vitals shown include unvalidated device data. ? ?Last Pain:  ?Vitals:  ? 11/10/21 0703  ?TempSrc:   ?PainSc: 0-No pain  ?   ? ?  ? ?Complications: No notable events documented. ?

## 2021-11-10 NOTE — Anesthesia Procedure Notes (Signed)
Procedure Name: Intubation ?Date/Time: 11/10/2021 8:20 AM ?Performed by: Gaylene Brooks, CRNA ?Pre-anesthesia Checklist: Patient identified, Emergency Drugs available, Suction available and Patient being monitored ?Patient Re-evaluated:Patient Re-evaluated prior to induction ?Oxygen Delivery Method: Circle System Utilized ?Preoxygenation: Pre-oxygenation with 100% oxygen ?Induction Type: IV induction ?Ventilation: Mask ventilation without difficulty ?Laryngoscope Size: Sabra Heck and 2 ?Grade View: Grade I ?Tube type: Oral ?Tube size: 7.5 mm ?Number of attempts: 1 ?Airway Equipment and Method: Stylet and Oral airway ?Placement Confirmation: ETT inserted through vocal cords under direct vision, positive ETCO2 and breath sounds checked- equal and bilateral ?Secured at: 21 cm ?Tube secured with: Tape ?Dental Injury: Teeth and Oropharynx as per pre-operative assessment  ? ? ? ? ?

## 2021-11-10 NOTE — Plan of Care (Signed)
  Problem: Education: Goal: Understanding of CV disease, CV risk reduction, and recovery process will improve Outcome: Progressing Goal: Individualized Educational Video(s) Outcome: Progressing   

## 2021-11-10 NOTE — Interval H&P Note (Signed)
History and Physical Interval Note: ? ?11/10/2021 ?7:54 AM ? ?ILAY CAPSHAW  has presented today for surgery, with the diagnosis of Right carotid artery stenosis.  The various methods of treatment have been discussed with the patient and family. After consideration of risks, benefits and other options for treatment, the patient has consented to  Procedure(s): ?RIGHT CAROTID ENDARTERECTOMY (Right) as a surgical intervention.  The patient's history has been reviewed, patient examined, no change in status, stable for surgery.  I have reviewed the patient's chart and labs.  Questions were answered to the patient's satisfaction.   ? ? ?Servando Snare ? ? ?

## 2021-11-10 NOTE — Op Note (Signed)
? ? ?Patient name: Greg [name] MRN: [id number] DOB: [birth year] Sex: male ? ?11/10/2021 ?Pre-operative Diagnosis: Asymptomatic right ICA stenosis ?Post-operative diagnosis:  Same ?Surgeon:  Eda Paschal. Greg Matters, MD ?Assistant: Arlee Muslim ?Procedure Performed: Right carotid endarterectomy with bovine pericardial patch angioplasty with 10 French shunt neuro protection ? ?Indications: 88 year old male found to have asymptomatic right ICA stenosis that was high-grade with peak systolic velocities greater than 500.  Given his elevated creatinine we did not obtain CT scan but duplex was reviewed in the office but seems to be a candidate for endarterectomy.  We discussed the risk benefits alternatives and he demonstrates good understanding and agrees to proceed. ? ?An experienced assistant was necessary to facilitate exposure of the carotid as well as endarterectomy and patch angioplasty including suction, countertraction and following the suture line as well as assistance with placing a shunt. ? ?Findings: The internal carotid artery was directly posterior to the external carotid artery.  There was high-grade stenosis of the carotid bifurcation that appeared to be subtotally occluded.  The common carotid artery was heavily diseased this required extension of the incision caudally and tacking the most proximal common carotid artery.  Distally we had good feathering of the plaque.  At completion there was low resistance signal in the ICA somewhat high resistance signal in the ECA.  Patient was neurologically intact upon awakening from anesthesia. ?  ?Procedure:  The patient was identified in the holding area and taken to the operating room was placed supine operative table general anesthesia was induced.  He was sterilely prepped and draped in the right neck in usual fashion, antibiotics were minister timeout was called.  An incision was made along the anterior border the sternocleidomastoid.  We dissected down to the common  carotid artery.  I did this with the assistance of Arlee Muslim, [place] who facilitated exposure of the common carotid artery as well as the internal and external carotid arteries with suction and retraction.  After we identified the common carotid artery we placed an umbilical tape around it the patient was fully heparinized.  We then dissected higher up into the external carotid artery.  There was 1 branch coming directly off the common carotid artery which had to be ligated between ties.  We ligated the facial vein between ties as well.  We dissected up onto the external carotid artery and placed a vessel loop around this.  The hypoglossal nerve as well as the ansa cervicalis were identified.  The ICA was noted to be directly posterior to the ECA.  We dissected high and the ICA behind the digastric muscle and to the level of the hypogastric nerve where the artery peeled normal we placed a vessel loop around this.  We prepared a bovine pericardial patch as well as a 10 [place] shunt.  We then clamped the ICA followed by the common carotid artery in the ECA.  The common carotid artery was opened longitudinally with 11 blade extended with Potts scissors.  The carotid bulb was thoroughly irrigated.  A 10 French shunt was placed distally and then proximally with the help of the assistant and we confirmed flow with Doppler.  I began endarterectomy with the help of the assistant providing countertraction.  We did have smooth feathering distally.  Proximally I could not get good feathering and there was very thick plaque.  I then extended my incision and further down on the common carotid artery I first placed a shunt clamp and then replaced an umbilical tape  more proximally and was able to use Rummel tourniquet to clamp the shunt and the shunt clamp was removed.  I then extended my arteriotomy and performed endarterectomy further proximally in the common carotid artery.  Ultimately divided this and then I tacked it down  using interrupted 7-0 Prolene suture.  I thoroughly irrigated the carotid bulb and then when there was no further debris I sewed a bovine pericardial patch in place with 6-0 Prolene suture.  Prior completion we removed the shunt and antegrade and retrograde blood the internal, external and common carotid arteries.  Upon completion I then remove the clamp from the external followed by the common and after several cardiac cycles the internal carotid artery.  There was good flow through the internal carotid artery with low resistance signal.  There were a few repair stitches that I placed on the medial wall.  50 mg of protamine was administered and hemostasis was obtained.  We then closed the platysma with Vicryl followed by skin with Monocryl.  Dermabond placed at the skin level.  He was then awakened from anesthesia having tolerated procedure well without immediate complication.  All counts were correct at completion. ? ?EBL: 100c ? ? ?Greg Eugene C. Greg Matters, MD ?[facility] ?Office: [phone] ?Pager: [phone] ? ? ?

## 2021-11-10 NOTE — Anesthesia Postprocedure Evaluation (Signed)
Anesthesia Post Note ? ?[name] ? ?Procedure(s) Performed: RIGHT CAROTID ENDARTERECTOMY (Right: Neck) ? ?  ? ?Patient location during evaluation: PACU ?Anesthesia Type: General ?Level of consciousness: sedated and patient cooperative ?Pain management: pain level controlled ?Vital Signs Assessment: post-procedure vital signs reviewed and stable ?Respiratory status: spontaneous breathing ?Cardiovascular status: stable ?Anesthetic complications: no ? ? ?No notable events documented. ? ?Last Vitals:  ?Vitals:  ? 11/10/21 1220 11/10/21 1400  ?BP: (!) 95/46 (!) 104/50  ?Pulse: 73 (!) 56  ?Resp: 19 17  ?Temp: 36.7 ?C   ?SpO2: 95%   ?  ?Last Pain:  ?Vitals:  ? 11/10/21 1220  ?TempSrc:   ?PainSc: Asleep  ? ? ?  ?  ?  ?  ?  ?  ? ?Nolon Nations ? ? ? ? ?

## 2021-11-10 NOTE — Telephone Encounter (Signed)
Notified patient via voicemail. 

## 2021-11-10 NOTE — Anesthesia Procedure Notes (Signed)
Arterial Line Insertion ?Start/End3/14/2023 7:30 AM, 11/10/2021 7:45 AM ?Performed by: Gaylene Brooks, CRNA, CRNA ? Preanesthetic checklist: patient identified, IV checked, site marked, risks and benefits discussed, surgical consent, monitors and equipment checked, pre-op evaluation, timeout performed and anesthesia consent ?Lidocaine 1% used for infiltration ?Right, radial was placed ?Catheter size: 20 G ?Hand hygiene performed  and maximum sterile barriers used  ?Allen's test indicative of satisfactory collateral circulation ?Attempts: 1 ?Procedure performed without using ultrasound guided technique. ?Following insertion, Biopatch and dressing applied. ?Post procedure assessment: normal ? ?Patient tolerated the procedure well with no immediate complications. ? ? ?

## 2021-11-10 NOTE — Care Management (Signed)
?  Transition of Care (TOC) Screening Note ? ? ?Patient Details  ?Name: Greg [name] ?Date of Birth: August 23, 1933 ? ? ?Transition of Care (TOC) CM/SW Contact:    ?Carles Collet, RN ?Phone Number: ?11/10/2021, 1:21 PM ? ? ? ?Transition of Care Department [facility]) has reviewed patient and no TOC needs have been identified at this time. We will continue to monitor patient advancement through interdisciplinary progression rounds. If new patient transition needs arise, please place a TOC consult. ?  ?

## 2021-11-11 ENCOUNTER — Encounter (HOSPITAL_COMMUNITY): Payer: Self-pay | Admitting: Vascular Surgery

## 2021-11-11 LAB — CBC
HCT: 25 % — ABNORMAL LOW (ref 39.0–52.0)
Hemoglobin: 8.5 g/dL — ABNORMAL LOW (ref 13.0–17.0)
MCH: 32.6 pg (ref 26.0–34.0)
MCHC: 34 g/dL (ref 30.0–36.0)
MCV: 95.8 fL (ref 80.0–100.0)
Platelets: 155 10*3/uL (ref 150–400)
RBC: 2.61 MIL/uL — ABNORMAL LOW (ref 4.22–5.81)
RDW: 12.9 % (ref 11.5–15.5)
WBC: 7.7 10*3/uL (ref 4.0–10.5)
nRBC: 0 % (ref 0.0–0.2)

## 2021-11-11 LAB — BASIC METABOLIC PANEL
Anion gap: 8 (ref 5–15)
BUN: 33 mg/dL — ABNORMAL HIGH (ref 8–23)
CO2: 21 mmol/L — ABNORMAL LOW (ref 22–32)
Calcium: 8.4 mg/dL — ABNORMAL LOW (ref 8.9–10.3)
Chloride: 110 mmol/L (ref 98–111)
Creatinine, Ser: 2.66 mg/dL — ABNORMAL HIGH (ref 0.61–1.24)
GFR, Estimated: 23 mL/min — ABNORMAL LOW (ref >60)
Glucose, Bld: 129 mg/dL — ABNORMAL HIGH (ref 70–99)
Potassium: 5 mmol/L (ref 3.5–5.1)
Sodium: 139 mmol/L (ref 135–145)

## 2021-11-11 MED ORDER — OXYCODONE-ACETAMINOPHEN 5-325 MG PO TABS: 1.0000 | ORAL_TABLET | Freq: Four times a day (QID) | ORAL | 0 refills | Status: DC | PRN

## 2021-11-11 NOTE — Plan of Care (Signed)
  Problem: Activity: Goal: Ability to return to baseline activity level will improve Outcome: Progressing   

## 2021-11-11 NOTE — Discharge Instructions (Signed)
   [facility] [place]  Discharge Instructions   Carotid Surgery  Please refer to the following instructions for your post-procedure care. Your surgeon or physician assistant will discuss any changes with you.  Activity  You are encouraged to walk as much as you can. You can slowly return to normal activities but must avoid strenuous activity and heavy lifting until your doctor tell you it's okay. Avoid activities such as vacuuming or swinging a golf club. You can drive after one week if you are comfortable and you are no longer taking prescription pain medications. It is normal to feel tired for serval weeks after your surgery. It is also normal to have difficulty with sleep habits, eating, and bowel movements after surgery. These will go away with time.  Bathing/Showering  Shower daily after you go home. Do not soak in a bathtub, hot tub, or swim until the incision heals completely.  Incision Care  Shower every day. Clean your incision with mild soap and water. Pat the area dry with a clean towel. You do not need a bandage unless otherwise instructed. Do not apply any ointments or creams to your incision. You may have skin glue on your incision. Do not peel it off. It will come off on its own in about one week. Your incision may feel thickened and raised for several weeks after your surgery. This is normal and the skin will soften over time.   For Men Only: It's okay to shave around the incision but do not shave the incision itself for 2 weeks. It is common to have numbness under your chin that could last for several months.  Diet  Resume your normal diet. There are no special food restrictions following this procedure. A low fat/low cholesterol diet is recommended for all patients with vascular disease. In order to heal from your surgery, it is CRITICAL to get adequate nutrition. Your body requires vitamins, minerals, and protein. Vegetables are the best source of  vitamins and minerals. Vegetables also provide the perfect balance of protein. Processed food has little nutritional value, so try to avoid this.  Medications  Resume taking all of your medications unless your doctor or physician assistant tells you not to. If your incision is causing pain, you may take over-the- counter pain relievers such as acetaminophen (Tylenol). If you were prescribed a stronger pain medication, please be aware these medications can cause nausea and constipation. Prevent nausea by taking the medication with a snack or meal. Avoid constipation by drinking plenty of fluids and eating foods with a high amount of fiber, such as fruits, vegetables, and grains.   Do not take Tylenol if you are taking prescription pain medications.  Follow Up  Our office will schedule a follow up appointment 2-3 weeks following discharge.  Please call us immediately for any of the following conditions  . Increased pain, redness, drainage (pus) from your incision site. . Fever of 101 degrees or higher. . If you should develop stroke (slurred speech, difficulty swallowing, weakness on one side of your body, loss of vision) you should call 911 and go to the nearest emergency room. .  Reduce your risk of vascular disease:  . Stop smoking. If you would like help call [facility] at 1-800-QUIT-NOW (1-800-784-8669) or [place] at 336-586-4000. . Manage your cholesterol . Maintain a desired weight . Control your diabetes . Keep your blood pressure down .  If you have any questions, please call the office at [phone]. 

## 2021-11-11 NOTE — Progress Notes (Signed)
?  Progress Note ? ? ? ?11/11/2021 ?8:47 AM ?1 Day Post-Op ? ?Subjective:  no complaints ? ? ?Vitals:  ? 11/11/21 0400 11/11/21 0755  ?BP:  (!) 128/56  ?Pulse: (!) 55 66  ?Resp: 17 16  ?Temp: 98.6 ?F (37 ?C) 98.4 ?F (36.9 ?C)  ?SpO2: 93% 94%  ? ?Physical Exam: ?Cardiac:  regular ?Lungs:  non labored ?Incisions:  right neck incision is c/d/I without swelling or hematoma ?Extremities:  moving all extremities without deficits ?Neurologic: CN intact. Speech coherent. Smile symmetric. Tongue midline. Sensation intact and equal bilaterally ? ?CBC ?   ?Component Value Date/Time  ? WBC 7.7 11/11/2021 0433  ? RBC 2.61 (L) 11/11/2021 0433  ? HGB 8.5 (L) 11/11/2021 0433  ? HGB 9.9 (L) 06/22/2017 1323  ? HCT 25.0 (L) 11/11/2021 0433  ? HCT 30.4 (L) 06/22/2017 1323  ? PLT 155 11/11/2021 0433  ? PLT 153 06/22/2017 1323  ? MCV 95.8 11/11/2021 0433  ? MCV 96.8 06/22/2017 1323  ? MCH 32.6 11/11/2021 0433  ? MCHC 34.0 11/11/2021 0433  ? RDW 12.9 11/11/2021 0433  ? RDW 13.7 06/22/2017 1323  ? LYMPHSABS 1.2 08/25/2021 0922  ? LYMPHSABS 1.4 06/22/2017 1323  ? MONOABS 0.4 08/25/2021 0922  ? MONOABS 0.6 06/22/2017 1323  ? EOSABS 0.1 08/25/2021 0922  ? EOSABS 0.0 06/22/2017 1323  ? BASOSABS 0.0 08/25/2021 3825  ? BASOSABS 0.0 06/22/2017 1323  ? ? ?BMET ?   ?Component Value Date/Time  ? NA 139 11/11/2021 0433  ? NA 142 06/22/2017 1323  ? K 5.0 11/11/2021 0433  ? K 4.0 06/22/2017 1323  ? CL 110 11/11/2021 0433  ? CO2 21 (L) 11/11/2021 0433  ? CO2 20 (L) 06/22/2017 1323  ? GLUCOSE 129 (H) 11/11/2021 0433  ? GLUCOSE 95 06/22/2017 1323  ? BUN 33 (H) 11/11/2021 0433  ? BUN 45.1 (H) 06/22/2017 1323  ? CREATININE 2.66 (H) 11/11/2021 0433  ? CREATININE 2.13 (H) 12/25/2018 1053  ? CREATININE 2.4 (H) 06/22/2017 1323  ? CALCIUM 8.4 (L) 11/11/2021 0433  ? CALCIUM 9.3 06/22/2017 1323  ? GFRNONAA 23 (L) 11/11/2021 0433  ? GFRNONAA 28 (L) 12/25/2018 1053  ? GFRAA 32 (L) 12/25/2018 1053  ? ? ?INR ?   ?Component Value Date/Time  ? INR 1.0 11/03/2021 1039   ? ? ? ?Intake/Output Summary (Last 24 hours) at 11/11/2021 0847 ?Last data filed at 11/11/2021 0755 ?Gross per 24 hour  ?Intake 1305.04 ml  ?Output 1100 ml  ?Net 205.04 ml  ? ? ? ?Assessment/Plan:  88 y.o. male is s/p right CEA 1 Day Post-Op  ? ?Neurologically intact ?Right neck incision is clean, dry and intact without swelling or hematoma ?Voiding without difficulty, tolerating diet ?Hemodynamically stable  ?Will have him ambulate this morning and if he tolerates okay he will be okay for discharge home  ? ?Karoline Caldwell, PA-C ?[facility] ?[phone] ?11/11/2021 ?8:47 AM ?

## 2021-11-12 ENCOUNTER — Telehealth: Payer: Self-pay | Admitting: Internal Medicine

## 2021-11-12 DIAGNOSIS — I700 Atherosclerosis of aorta: Secondary | ICD-10-CM | POA: Diagnosis not present

## 2021-11-12 DIAGNOSIS — N184 Chronic kidney disease, stage 4 (severe): Secondary | ICD-10-CM | POA: Diagnosis not present

## 2021-11-12 DIAGNOSIS — I10 Essential (primary) hypertension: Secondary | ICD-10-CM | POA: Diagnosis not present

## 2021-11-12 DIAGNOSIS — D638 Anemia in other chronic diseases classified elsewhere: Secondary | ICD-10-CM | POA: Diagnosis not present

## 2021-11-12 DIAGNOSIS — H547 Unspecified visual loss: Secondary | ICD-10-CM | POA: Diagnosis not present

## 2021-11-12 DIAGNOSIS — Z48812 Encounter for surgical aftercare following surgery on the circulatory system: Secondary | ICD-10-CM | POA: Diagnosis not present

## 2021-11-12 NOTE — Telephone Encounter (Signed)
Called to report medication interaction of simvastatin (ZOCOR) 40 MG tablet and amLODipine (NORVASC) 10 MG tablet.  ? ?States it is a level 1 risk.  ?

## 2021-11-13 NOTE — Discharge Summary (Signed)
?Carotid Discharge Summary ? ? ? ? ?Greg [name] ?October 14, 1932 88 y.o. male ? ?[id number] ? ?Admission Date: ?11/10/2021 ? ?Discharge Date: ?11/11/2021 ? ?Physician: ?Waynetta Sandy, MD ? ?Admission Diagnosis: ?Carotid artery stenosis [I65.29] ? ?Hospital Course:  ?The patient was admitted to the hospital and taken to the operating room on 11/10/2021 and underwent right carotid endarterectomy.  The pt tolerated the procedure well and was transported to the PACU in excellent condition. ? ? By POD 1, the pt neuro status remained intact. Right neck incision clean, dry and intact without swelling or hematoma. Tolerating diet. Voiding without difficulty.  Hemodynamically stable. The remainder of the hospital course consisted of increasing mobilization and increasing intake of solids without difficulty. ? ?He remained stable for discharge home. He will continue Aspirin and Statin. PDMP was reviewed and post operative pain medication was sent to patients requested pharmacy. He has follow up arranged in 2 weeks for incision check.  ? ?Recent Labs  ?  11/11/21 ?0433  ?NA 139  ?K 5.0  ?CL 110  ?CO2 21*  ?GLUCOSE 129*  ?BUN 33*  ?CALCIUM 8.4*  ? ?Recent Labs  ?  11/11/21 ?0433  ?WBC 7.7  ?HGB 8.5*  ?HCT 25.0*  ?PLT 155  ? ?No results for input(s): INR in the last 72 hours. ? ? ?Discharge Instructions   ? ? Call MD for:  difficulty breathing, headache or visual disturbances   Complete by: As directed ?  ? Call MD for:  redness, tenderness, or signs of infection (pain, swelling, redness, odor or green/yellow discharge around incision site)   Complete by: As directed ?  ? Call MD for:  severe uncontrolled pain   Complete by: As directed ?  ? Call MD for:  temperature >100.4   Complete by: As directed ?  ? Diet - low sodium heart healthy   Complete by: As directed ?  ? Discharge patient   Complete by: As directed ?  ? Once patient has ambulated without difficulty  ? Discharge disposition: 01-Home or Self Care  ? Discharge  patient date: 11/11/2021  ? Discharge wound care:   Complete by: As directed ?  ? May shower. Wash incision with mild soap and water, pat dry. Skin glue will eventually come off with washings  ? Driving Restrictions   Complete by: As directed ?  ? No driving while taking prescription pain medication  ? Increase activity slowly   Complete by: As directed ?  ? Lifting restrictions   Complete by: As directed ?  ? No heavy lifting, pushing, pulling > 10 lbs for 4 weeks  ? ?  ? ? ?Discharge Diagnosis:  ?Carotid artery stenosis [I65.29] ? ?Secondary Diagnosis: ?Patient Active Problem List  ? Diagnosis Date Noted  ? Carotid artery stenosis 11/10/2021  ? New-onset angina ([place]) 08/25/2021  ? Aortic atherosclerosis ([place]) 11/10/2020  ? CKD (chronic kidney disease) stage 4, GFR 15-29 ml/min (HCC) 11/10/2020  ? Allergic rhinitis 10/12/2020  ? Left shoulder pain 08/15/2020  ? Paresthesias in left hand 08/15/2020  ? Cough 08/15/2020  ? Dyspnea on exertion 11/01/2019  ? Vitamin D deficiency 03/10/2019  ? Memory loss 07/11/2018  ? Sleep disturbance 04/27/2017  ? Need for influenza vaccination 04/27/2017  ? Sweating increase 04/27/2017  ? Medicare annual wellness visit, subsequent 11/11/2016  ? Encounter for well adult exam with abnormal findings 11/11/2016  ? Spinal stenosis of lumbar region 09/13/2016  ? Hypothyroidism 10/07/2014  ? Anemia of chronic disease 04/09/2014  ?  S/P TKR (total knee replacement) 04/03/2013  ? Benign prostatic hyperplasia with urinary obstruction 07/20/2011  ? Hypogonadism, male 01/11/2011  ? BACK PAIN, LUMBAR 07/13/2010  ? HYPERCHOLESTEROLEMIA 01/06/2008  ? COLONIC POLYPS 01/03/2008  ? DIABETES MELLITUS, BORDERLINE 01/03/2008  ? Anxiety state 07/11/2007  ? GLAUCOMA 07/11/2007  ? Essential hypertension 07/11/2007  ? Venous (peripheral) insufficiency 07/11/2007  ? GERD 07/11/2007  ? Disorder resulting from impaired renal function 07/11/2007  ? Osteoarthritis 07/11/2007  ? ?Past Medical History:  ?Diagnosis  Date  ? Anxiety   ? Blind right eye   ? Dizziness   ? DJD (degenerative joint disease)   ? Enlarged prostate   ? takes Flomax daily  ? GERD (gastroesophageal reflux disease)   ? only when eats greasy foods and takes OTC meds prn  ? Glaucoma   ? can't see out of right eye  ? History of colon polyps   ? History of colonic polyps   ? adenomatous  ? History of prostatitis   ? Hypercholesteremia   ? takes Simvastatin nightly  ? Hypertension   ? takes Labetolol daily  ? Joint pain   ? Nocturia   ? Pre-diabetes   ? Renal insufficiency   ? Venous insufficiency   ? ? ?Allergies as of 11/11/2021   ? ?   Reactions  ? Lisinopril   ? REACTION: INTOL to all ACE's  ? Valsartan   ? REACTION: INTOL to all ARB's  ? ?  ? ?  ?Medication List  ?  ? ?STOP taking these medications   ? ?acetaminophen 500 MG tablet ?Commonly known as: TYLENOL ?  ?traMADol 50 MG tablet ?Commonly known as: ULTRAM ?  ? ?  ? ?TAKE these medications   ? ?Accu-Chek FastClix Lancets Misc ?TEST ONCE DAILY AS NEEDED ?  ?Accu-Chek Guide test strip ?Generic drug: glucose blood ?USE DAILY AS NEEDED ?  ?Accu-Chek Guide w/Device Kit ?USE AS DIRECTED. ?  ?amLODipine 10 MG tablet ?Commonly known as: NORVASC ?Take 1 tablet (10 mg total) by mouth daily. TAKE 1 TABLET(5 MG) BY MOUTH DAILY ?  ?aspirin EC 81 MG tablet ?Take 81 mg by mouth daily. ?  ?carvedilol 3.125 MG tablet ?Commonly known as: COREG ?Take 1 tablet (3.125 mg total) by mouth 2 (two) times daily with a meal. ?  ?CENTRUM PO ?Take 1 tablet by mouth every other day. ?  ?fexofenadine 180 MG tablet ?Commonly known as: ALLEGRA ?Take 1 tablet (180 mg total) by mouth daily. ?  ?levothyroxine 50 MCG tablet ?Commonly known as: SYNTHROID ?TAKE 1 TABLET BY MOUTH  DAILY BEFORE BREAKFAST ?  ?oxyCODONE-acetaminophen 5-325 MG tablet ?Commonly known as: PERCOCET/ROXICET ?Take 1 tablet by mouth every 6 (six) hours as needed for moderate pain. ?  ?pantoprazole 40 MG tablet ?Commonly known as: PROTONIX ?TAKE 1 TABLET BY MOUTH   DAILY ?  ?PREVAGEN PO ?Take 1 tablet by mouth daily. ?  ?simvastatin 40 MG tablet ?Commonly known as: ZOCOR ?TAKE 1 TABLET BY MOUTH AT  BEDTIME ?  ?tamsulosin 0.4 MG Caps capsule ?Commonly known as: FLOMAX ?TAKE 1 CAPSULE BY MOUTH  DAILY ?  ? ?  ? ?  ?  ? ? ?  ?Discharge Care Instructions  ?(From admission, onward)  ?  ? ? ?  ? ?  Start     Ordered  ? 11/11/21 0000  Discharge wound care:       ?Comments: May shower. Wash incision with mild soap and water, pat dry. Skin glue will eventually  come off with washings  ? 11/11/21 0854  ? ?  ?  ? ?  ? ? ? ?Discharge Instructions: ? ? ?[facility] [place] ?Discharge Instructions Carotid Endarterectomy (CEA) ? ?Please refer to the following instructions for your post-procedure care. Your surgeon or physician assistant will discuss any changes with you. ? ?Activity ? ?You are encouraged to walk as much as you can. You can slowly return to normal activities but must avoid strenuous activity and heavy lifting until your doctor tell you it's OK. Avoid activities such as vacuuming or swinging a golf club. You can drive after one week if you are comfortable and you are no longer taking prescription pain medications. It is normal to feel tired for serval weeks after your surgery. It is also normal to have difficulty with sleep habits, eating, and bowel movements after surgery. These will go away with time. ? ?Bathing/Showering ? ?You may shower after you come home. Do not soak in a bathtub, hot tub, or swim until the incision heals completely. ? ?Incision Care ? ?Shower every day. Clean your incision with mild soap and water. Pat the area dry with a clean towel. You do not need a bandage unless otherwise instructed. Do not apply any ointments or creams to your incision. You may have skin glue on your incision. Do not peel it off. It will come off on its own in about one week. Your incision may feel thickened and raised for several weeks after your surgery.  This is normal and the skin will soften over time. For Men Only: It's OK to shave around the incision but do not shave the incision itself for 2 weeks. It is common to have numbness under your chin that could l

## 2021-11-16 MED ORDER — ATORVASTATIN CALCIUM 40 MG PO TABS: 40.0000 mg | ORAL_TABLET | Freq: Every day | ORAL | 3 refills | Status: DC

## 2021-11-16 NOTE — Telephone Encounter (Signed)
Left message for patient to call back with provider's message below ?

## 2021-11-16 NOTE — Telephone Encounter (Signed)
Ok to contact pt -  ? ?Due to being on amlodipine 10 mg we need to change the zocorr 40 mg to lipitor 40 mg per day, as the zocor and amlodipine dont mix well ? ?I sent the rx to [facility] ?

## 2021-11-18 DIAGNOSIS — H547 Unspecified visual loss: Secondary | ICD-10-CM | POA: Diagnosis not present

## 2021-11-18 DIAGNOSIS — I700 Atherosclerosis of aorta: Secondary | ICD-10-CM | POA: Diagnosis not present

## 2021-11-18 DIAGNOSIS — I10 Essential (primary) hypertension: Secondary | ICD-10-CM | POA: Diagnosis not present

## 2021-11-18 DIAGNOSIS — D638 Anemia in other chronic diseases classified elsewhere: Secondary | ICD-10-CM | POA: Diagnosis not present

## 2021-11-18 DIAGNOSIS — N184 Chronic kidney disease, stage 4 (severe): Secondary | ICD-10-CM | POA: Diagnosis not present

## 2021-11-18 DIAGNOSIS — Z48812 Encounter for surgical aftercare following surgery on the circulatory system: Secondary | ICD-10-CM | POA: Diagnosis not present

## 2021-11-25 ENCOUNTER — Encounter: Payer: Self-pay | Admitting: Vascular Surgery

## 2021-11-25 ENCOUNTER — Other Ambulatory Visit: Payer: Self-pay

## 2021-11-25 ENCOUNTER — Ambulatory Visit (INDEPENDENT_AMBULATORY_CARE_PROVIDER_SITE_OTHER): Payer: Medicare Other | Admitting: Vascular Surgery

## 2021-11-25 VITALS — BP 155/79 | HR 90 | Temp 97.9°F | Resp 20 | Ht 69.0 in | Wt 187.0 lb

## 2021-11-25 DIAGNOSIS — Z20822 Contact with and (suspected) exposure to covid-19: Secondary | ICD-10-CM | POA: Diagnosis not present

## 2021-11-25 DIAGNOSIS — I6521 Occlusion and stenosis of right carotid artery: Secondary | ICD-10-CM

## 2021-11-25 NOTE — Progress Notes (Signed)
?  ? ?  Subjective:  ?  ? Patient ID: Greg [name], male   DOB: 08-29-1933, 88 y.o.   MRN: [id number] ? ?HPI 88 year old male status post right carotid endarterectomy for asymptomatic disease.  He has done very well from this.  He does have some numbness of his neck.  He has not had any issues with swallowing has not had any signs or symptoms of stroke. ? ? ?Review of Systems ?Right neck numbness ?   ?Objective:  ? Physical Exam ?Vitals:  ? 11/25/21 1104  ?BP: (!) 155/79  ?Pulse: 90  ?Resp: 20  ?Temp: 97.9 ?F (36.6 ?C)  ?SpO2: 95%  ?Awake alert oriented ?Nonlabored respirations ?Right neck incision healing well with mild neck fullness no erythema or ecchymosis ?Moving all extremities without limitation ? ?   ?Assessment:  ?   ?88-year male status post right carotid endarterectomy for asymptomatic disease.  He has recovered well from this.  He continues on aspirin and statin. ?   ?Plan:  ?   ?Follow-up in 9 months with repeat carotid duplex. ?   ?Greg [name] C. Donzetta Matters, MD ?[facility] ?Office: [phone] ?Pager: [phone] ? ?

## 2021-11-26 DIAGNOSIS — Z48812 Encounter for surgical aftercare following surgery on the circulatory system: Secondary | ICD-10-CM | POA: Diagnosis not present

## 2021-11-26 DIAGNOSIS — D638 Anemia in other chronic diseases classified elsewhere: Secondary | ICD-10-CM | POA: Diagnosis not present

## 2021-11-26 DIAGNOSIS — I10 Essential (primary) hypertension: Secondary | ICD-10-CM | POA: Diagnosis not present

## 2021-11-26 DIAGNOSIS — N184 Chronic kidney disease, stage 4 (severe): Secondary | ICD-10-CM | POA: Diagnosis not present

## 2021-11-26 DIAGNOSIS — I700 Atherosclerosis of aorta: Secondary | ICD-10-CM | POA: Diagnosis not present

## 2021-11-26 DIAGNOSIS — Z20822 Contact with and (suspected) exposure to covid-19: Secondary | ICD-10-CM | POA: Diagnosis not present

## 2021-11-26 DIAGNOSIS — H547 Unspecified visual loss: Secondary | ICD-10-CM | POA: Diagnosis not present
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
## 2021-12-02 ENCOUNTER — Telehealth: Payer: Self-pay

## 2021-12-02 MED ORDER — ACCU-CHEK GUIDE VI STRP: ORAL_STRIP | 2 refills | Status: DC

## 2021-12-02 NOTE — Telephone Encounter (Signed)
Prescription sent to pharmacy.

## 2021-12-02 NOTE — Telephone Encounter (Signed)
Pt is requesting a refill on: ?glucose blood (ACCU-CHEK GUIDE) test strip ? ?Pharmacy: ?[facility] [place] [address] ? ?LOV 11/09/21 ?ROV 03/22/22 ? ?

## 2021-12-03 ENCOUNTER — Encounter: Payer: Self-pay | Admitting: Cardiovascular Disease

## 2021-12-03 ENCOUNTER — Ambulatory Visit: Payer: Medicare HMO | Admitting: Cardiovascular Disease

## 2021-12-03 VITALS — BP 130/72 | HR 92 | Ht 69.0 in | Wt 173.0 lb

## 2021-12-03 DIAGNOSIS — I6521 Occlusion and stenosis of right carotid artery: Secondary | ICD-10-CM

## 2021-12-03 DIAGNOSIS — I471 Supraventricular tachycardia: Secondary | ICD-10-CM

## 2021-12-03 DIAGNOSIS — I10 Essential (primary) hypertension: Secondary | ICD-10-CM | POA: Diagnosis not present

## 2021-12-03 DIAGNOSIS — E782 Mixed hyperlipidemia: Secondary | ICD-10-CM | POA: Diagnosis not present

## 2021-12-03 NOTE — Patient Instructions (Signed)

## 2021-12-07 ENCOUNTER — Encounter: Payer: Self-pay | Admitting: Internal Medicine

## 2021-12-07 ENCOUNTER — Ambulatory Visit: Payer: Medicare HMO | Admitting: Internal Medicine

## 2021-12-07 ENCOUNTER — Telehealth: Payer: Self-pay | Admitting: Internal Medicine

## 2021-12-07 DIAGNOSIS — R002 Palpitations: Secondary | ICD-10-CM | POA: Diagnosis not present

## 2021-12-07 NOTE — Telephone Encounter (Signed)
PT visits today with questions regarding current medication listing. PT's son will be handling setting up medications for PT and is trying to get an idea as what they should and should not be taking. PT had visited one of his other doctor's and was made aware he might be taking 2 different BP medicines at the moment. I had printed him out a Medication List with everything we have listed and the instructions for each, but PT would like to know what times he should be taking each for his son to view. If we could get something written up for him with that information he would be happy to come and pick it up. ? ?CB for son: [phone] ?CB for PT: [phone] ?

## 2021-12-07 NOTE — Patient Instructions (Addendum)
Medication Instructions:  Your physician recommends that you continue on your current medications as directed. Please refer to the Current Medication list given to you today.  Labwork: None ordered.  Testing/Procedures: None ordered.  Follow-Up: Your physician wants you to follow-up in: as needed with Gregg Taylor, MD    Any Other Special Instructions Will Be Listed Below (If Applicable).  If you need a refill on your cardiac medications before your next appointment, please call your pharmacy.       
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
## 2021-12-09 ENCOUNTER — Other Ambulatory Visit: Payer: Self-pay | Admitting: *Deleted

## 2021-12-09 ENCOUNTER — Ambulatory Visit: Payer: Self-pay

## 2021-12-09 ENCOUNTER — Ambulatory Visit (INDEPENDENT_AMBULATORY_CARE_PROVIDER_SITE_OTHER): Payer: Medicare HMO | Admitting: Surgery

## 2021-12-09 DIAGNOSIS — M25551 Pain in right hip: Secondary | ICD-10-CM | POA: Diagnosis not present

## 2021-12-09 DIAGNOSIS — M4726 Other spondylosis with radiculopathy, lumbar region: Secondary | ICD-10-CM | POA: Diagnosis not present

## 2021-12-09 DIAGNOSIS — M545 Low back pain, unspecified: Secondary | ICD-10-CM

## 2021-12-09 DIAGNOSIS — I6521 Occlusion and stenosis of right carotid artery: Secondary | ICD-10-CM

## 2021-12-09 MED ORDER — METHYLPREDNISOLONE 4 MG PO TABS: ORAL_TABLET | ORAL | 0 refills | Status: DC

## 2021-12-09 NOTE — Telephone Encounter (Signed)
Discussed patient's medications with patient ?

## 2021-12-09 NOTE — Telephone Encounter (Signed)
Left message for patient to call me back today ?
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
## 2021-12-23 ENCOUNTER — Encounter: Payer: Self-pay | Admitting: Internal Medicine

## 2021-12-23 ENCOUNTER — Ambulatory Visit (INDEPENDENT_AMBULATORY_CARE_PROVIDER_SITE_OTHER): Payer: Medicare HMO | Admitting: Internal Medicine

## 2021-12-23 VITALS — BP 150/74 | HR 81 | Temp 98.0°F | Ht 69.0 in | Wt 171.4 lb

## 2021-12-23 DIAGNOSIS — N184 Chronic kidney disease, stage 4 (severe): Secondary | ICD-10-CM | POA: Diagnosis not present

## 2021-12-23 DIAGNOSIS — I10 Essential (primary) hypertension: Secondary | ICD-10-CM

## 2021-12-23 DIAGNOSIS — R7309 Other abnormal glucose: Secondary | ICD-10-CM | POA: Diagnosis not present

## 2021-12-23 DIAGNOSIS — F5101 Primary insomnia: Secondary | ICD-10-CM

## 2021-12-23 DIAGNOSIS — E559 Vitamin D deficiency, unspecified: Secondary | ICD-10-CM | POA: Diagnosis not present

## 2021-12-23 MED ORDER — TRAZODONE HCL 50 MG PO TABS
25.0000 mg | ORAL_TABLET | Freq: Every evening | ORAL | 1 refills | Status: DC | PRN
Start: 2021-12-23 — End: 2022-11-10

## 2021-12-23 NOTE — Progress Notes (Signed)
Patient ID: Greg [name], male   DOB: 23-Jan-1933, 88 y.o.   MRN: [id number] ? ? ?

## 2021-12-26 ENCOUNTER — Encounter: Payer: Self-pay | Admitting: Internal Medicine

## 2021-12-26 DIAGNOSIS — G4700 Insomnia, unspecified: Secondary | ICD-10-CM | POA: Insufficient documentation
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
## 2021-12-26 NOTE — Assessment & Plan Note (Signed)
Recent onset new worsening, for trazodone qhs prn,  to f/u any worsening symptoms or concerns ?

## 2021-12-26 NOTE — Assessment & Plan Note (Signed)
. ?  Last vitamin D Lab Results  Component Value Date   VD25OH 49.18 05/11/2021   Stable, cont oral replacement  

## 2021-12-26 NOTE — Assessment & Plan Note (Signed)
BP Readings from Last 3 Encounters:  ?12/23/21 (!) 150/74  ?12/07/21 134/72  ?12/03/21 130/72  ? ?Mild uncontrolled here, pt state controlled < 140/90 athome,, pt to continue medical treatment norvasc ? ?

## 2021-12-26 NOTE — Patient Instructions (Signed)
Please take all new medication as prescribed 

## 2021-12-26 NOTE — Assessment & Plan Note (Signed)
Lab Results  Component Value Date   CREATININE 2.66 (H) 11/11/2021   Stable overall, cont to avoid nephrotoxins  

## 2021-12-26 NOTE — Assessment & Plan Note (Signed)
Lab Results  Component Value Date   HGBA1C 6.6 (H) 08/25/2021   Stable, pt to continue current medical treatment  - diet, wt control  

## 2022-01-20 ENCOUNTER — Ambulatory Visit: Payer: Medicare HMO | Admitting: Surgery

## 2022-02-11 DIAGNOSIS — H5203 Hypermetropia, bilateral: Secondary | ICD-10-CM | POA: Diagnosis not present

## 2022-02-11 DIAGNOSIS — H524 Presbyopia: Secondary | ICD-10-CM | POA: Diagnosis not present

## 2022-02-23 ENCOUNTER — Ambulatory Visit: Payer: Medicare HMO | Admitting: Podiatry

## 2022-02-23 ENCOUNTER — Encounter: Payer: Self-pay | Admitting: Podiatry

## 2022-02-23 DIAGNOSIS — M79675 Pain in left toe(s): Secondary | ICD-10-CM | POA: Diagnosis not present

## 2022-02-23 DIAGNOSIS — N184 Chronic kidney disease, stage 4 (severe): Secondary | ICD-10-CM

## 2022-02-23 DIAGNOSIS — B351 Tinea unguium: Secondary | ICD-10-CM

## 2022-02-23 DIAGNOSIS — M79674 Pain in right toe(s): Secondary | ICD-10-CM | POA: Diagnosis not present

## 2022-03-01 ENCOUNTER — Telehealth: Payer: Self-pay | Admitting: Internal Medicine

## 2022-03-01 MED ORDER — LEVOTHYROXINE SODIUM 50 MCG PO TABS: 50.0000 ug | ORAL_TABLET | Freq: Every day | ORAL | 3 refills | Status: DC

## 2022-03-01 NOTE — Telephone Encounter (Signed)
Pt stated needs cheaper brand in place of  levothyroxine (SYNTHROID) 50 MCG tablet    [facility] [phone] - [place], [address] Phone:  [phone]  Fax:  [phone]

## 2022-03-01 NOTE — Telephone Encounter (Signed)
Ok I resent as generic

## 2022-03-03 ENCOUNTER — Other Ambulatory Visit: Payer: Self-pay

## 2022-03-03 MED ORDER — AMLODIPINE BESYLATE 10 MG PO TABS: 10.0000 mg | ORAL_TABLET | Freq: Every day | ORAL | 3 refills | Status: DC

## 2022-03-22 ENCOUNTER — Ambulatory Visit (INDEPENDENT_AMBULATORY_CARE_PROVIDER_SITE_OTHER): Payer: Medicare HMO | Admitting: Internal Medicine

## 2022-03-22 ENCOUNTER — Encounter: Payer: Self-pay | Admitting: Internal Medicine

## 2022-03-22 VITALS — BP 140/70 | HR 76 | Temp 98.4°F | Ht 69.0 in | Wt 173.6 lb

## 2022-03-22 DIAGNOSIS — E559 Vitamin D deficiency, unspecified: Secondary | ICD-10-CM | POA: Diagnosis not present

## 2022-03-22 DIAGNOSIS — I10 Essential (primary) hypertension: Secondary | ICD-10-CM

## 2022-03-22 DIAGNOSIS — N184 Chronic kidney disease, stage 4 (severe): Secondary | ICD-10-CM | POA: Diagnosis not present

## 2022-03-22 DIAGNOSIS — D638 Anemia in other chronic diseases classified elsewhere: Secondary | ICD-10-CM | POA: Diagnosis not present

## 2022-03-22 DIAGNOSIS — R7309 Other abnormal glucose: Secondary | ICD-10-CM | POA: Diagnosis not present

## 2022-03-22 DIAGNOSIS — R221 Localized swelling, mass and lump, neck: Secondary | ICD-10-CM | POA: Insufficient documentation

## 2022-03-22 MED ORDER — LEVOTHYROXINE SODIUM 50 MCG PO TABS: 50.0000 ug | ORAL_TABLET | Freq: Every day | ORAL | 3 refills | Status: DC

## 2022-03-22 MED ORDER — CARVEDILOL 3.125 MG PO TABS
3.1250 mg | ORAL_TABLET | Freq: Two times a day (BID) | ORAL | 3 refills | Status: DC
Start: 2022-03-22 — End: 2022-08-06

## 2022-03-22 MED ORDER — TAMSULOSIN HCL 0.4 MG PO CAPS: 0.4000 mg | ORAL_CAPSULE | Freq: Every day | ORAL | 3 refills | Status: AC

## 2022-03-22 MED ORDER — AMLODIPINE BESYLATE 10 MG PO TABS
10.0000 mg | ORAL_TABLET | Freq: Every day | ORAL | 3 refills | Status: DC
Start: 2022-03-22 — End: 2022-10-13

## 2022-03-22 MED ORDER — ATORVASTATIN CALCIUM 40 MG PO TABS: 40.0000 mg | ORAL_TABLET | Freq: Every day | ORAL | 3 refills | Status: DC

## 2022-03-22 MED ORDER — PANTOPRAZOLE SODIUM 40 MG PO TBEC: 40.0000 mg | DELAYED_RELEASE_TABLET | Freq: Every day | ORAL | 3 refills | Status: DC

## 2022-03-22 NOTE — Assessment & Plan Note (Signed)
Lab Results  Component Value Date   WBC 7.7 11/11/2021   HGB 8.5 (L) 11/11/2021   HCT 25.0 (L) 11/11/2021   MCV 95.8 11/11/2021   PLT 155 11/11/2021   Most recent hgb severe, for f/u lab and iron level

## 2022-03-22 NOTE — Assessment & Plan Note (Addendum)
Etiology unclear, for ENT referral, cxr

## 2022-03-22 NOTE — Assessment & Plan Note (Signed)
Lab Results  Component Value Date   HGBA1C 6.6 (H) 08/25/2021   Stable, pt to continue current medical treatment  - diet, wt control

## 2022-03-22 NOTE — Patient Instructions (Signed)
Please continue all other medications as before, and refills have been done if requested.  Please have the pharmacy call with any other refills you may need.  Please continue your efforts at being more active, low cholesterol diet, and weight control.  You are otherwise up to date with prevention measures today.  Please keep your appointments with your specialists as you may have planned  You will be contacted regarding the referral for: ENT for the left neck lump  Please go to the LAB at the blood drawing area for the tests to be done  You will be contacted by phone if any changes need to be made immediately.  Otherwise, you will receive a letter about your results with an explanation, but please check with MyChart first.  Please remember to sign up for MyChart if you have not done so, as this will be important to you in the future with finding out test results, communicating by private email, and scheduling acute appointments online when needed.  Please make an Appointment to return in 6 months, or sooner if needed

## 2022-03-22 NOTE — Assessment & Plan Note (Signed)
BP Readings from Last 3 Encounters:  03/22/22 140/70  12/23/21 (!) 150/74  12/07/21 134/72   Stable, pt to continue medical treatment norvasc 10 mg qd
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
## 2022-03-22 NOTE — Assessment & Plan Note (Signed)
Lab Results  Component Value Date   CREATININE 2.66 (H) 11/11/2021   Stable overall, cont to avoid nephrotoxins

## 2022-03-22 NOTE — Assessment & Plan Note (Signed)
Last vitamin D Lab Results  Component Value Date   VD25OH 49.18 05/11/2021   Stable, cont oral replacement

## 2022-03-26 ENCOUNTER — Telehealth: Payer: Self-pay

## 2022-03-26 NOTE — Telephone Encounter (Signed)
Pharmacy was calling to verify the Rx sent in on 7/24 for Amlodipine 10 mg.   The sig on the Rx says Sig: Take 1 tablet (10 mg total) by mouth daily. TAKE 1 TABLET(5 MG) BY MOUTH DAILY  However the note states to take '10mg'$  qd.   CMA confirms that is '10mg'$  qd

## 2022-04-02 IMAGING — DX DG CHEST 2V
2 series · 2 of 2 positions shown · non-contrast
Comparison: Chest x-ray dated August 15, 2020

CLINICAL DATA: Angina

EXAM:
CHEST - 2 VIEW

[chest pa]
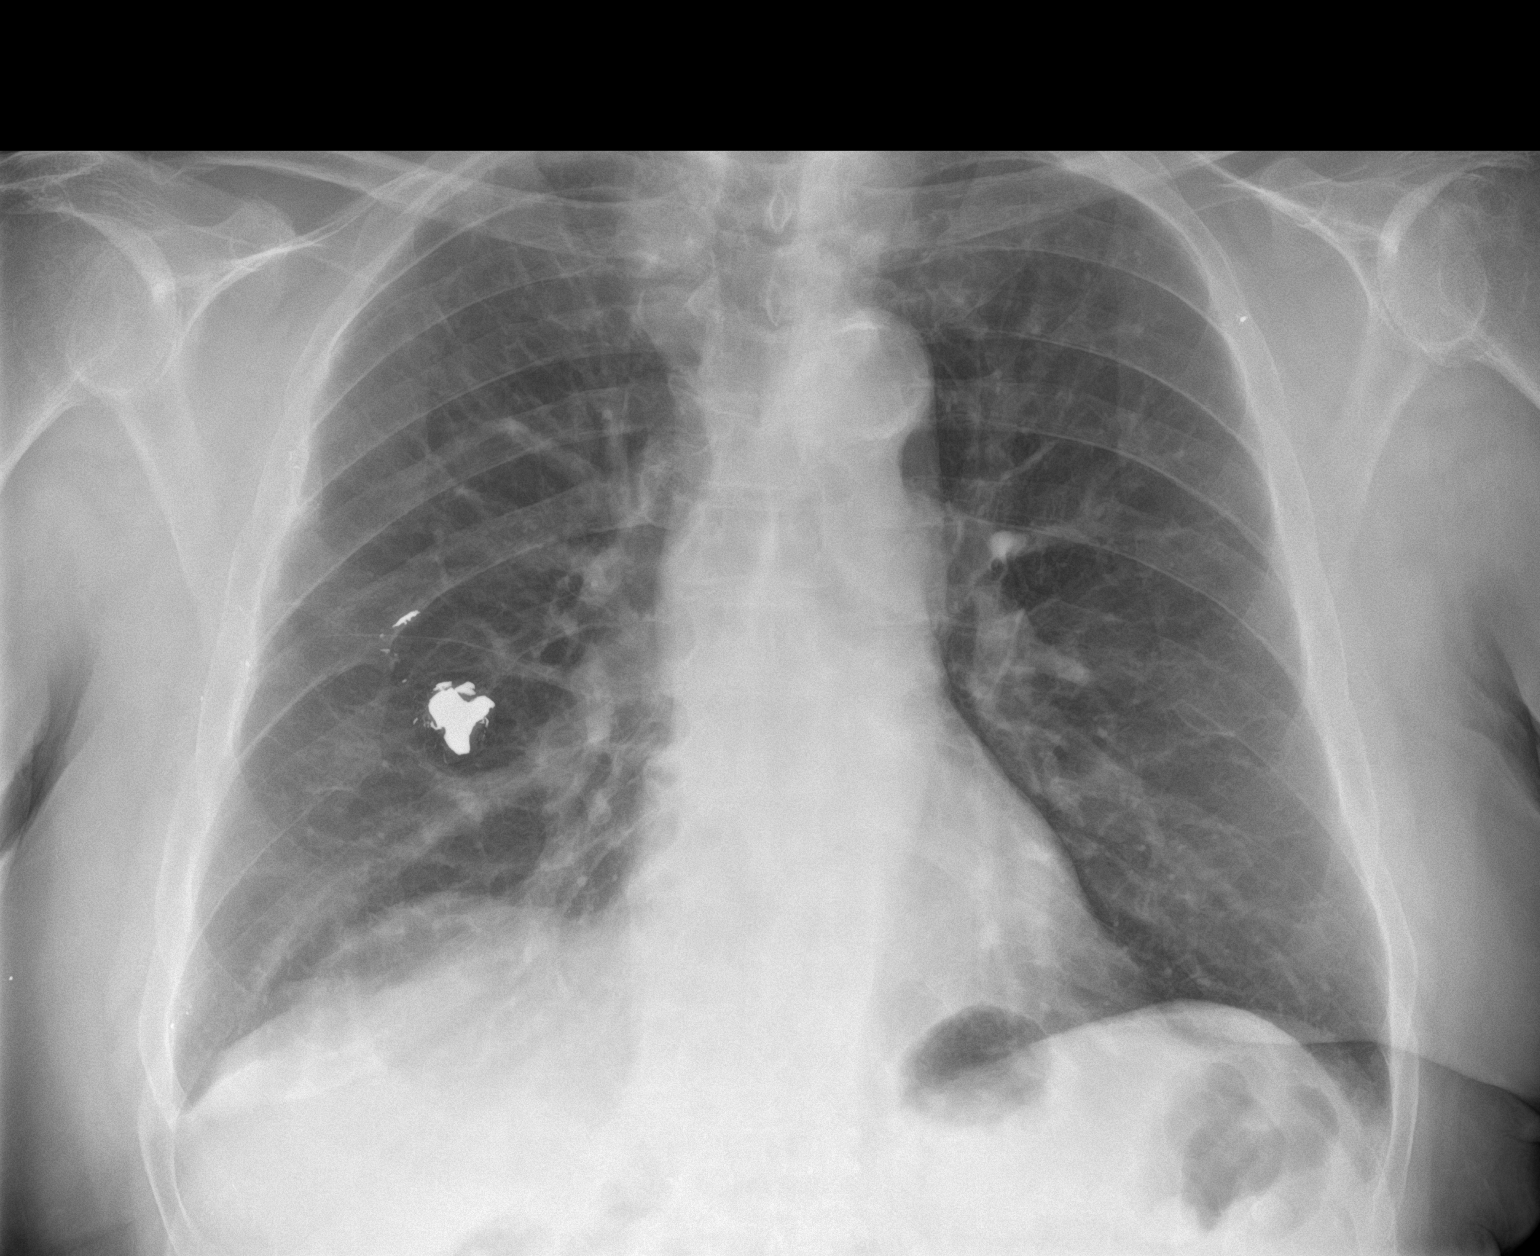

[chest lat]
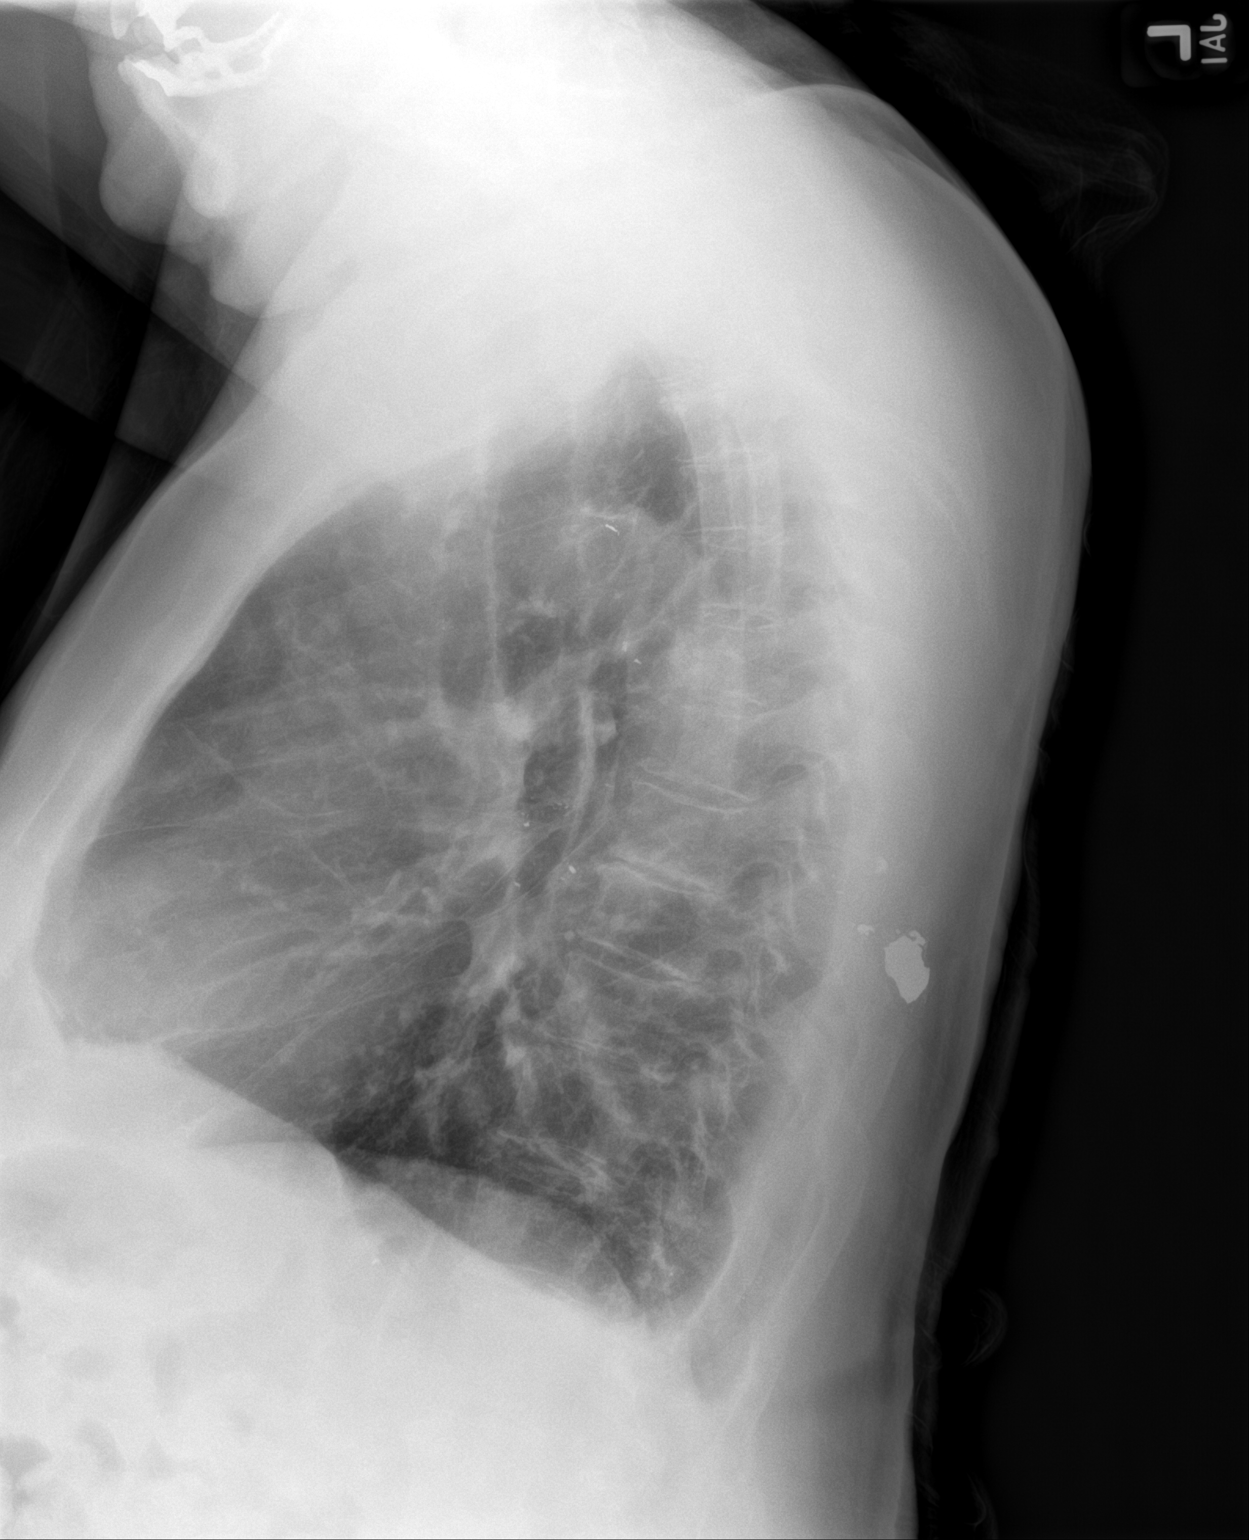

[2 of 2 positions shown; findings below may reference images not displayed]

FINDINGS: Cardiac and mediastinal contours are unchanged. Lungs are clear. No
evidence of pleural effusion or pneumothorax. Calcification
overlying the right hemithorax is within the soft tissues of the
back on lateral view.
IMPRESSION: No active cardiopulmonary disease.

## 2022-04-08 DIAGNOSIS — K118 Other diseases of salivary glands: Secondary | ICD-10-CM | POA: Diagnosis not present

## 2022-04-09 ENCOUNTER — Other Ambulatory Visit: Payer: Self-pay | Admitting: Physician Assistant

## 2022-04-09 DIAGNOSIS — K118 Other diseases of salivary glands: Secondary | ICD-10-CM

## 2022-04-15 ENCOUNTER — Other Ambulatory Visit: Payer: Medicare HMO

## 2022-04-15 ENCOUNTER — Ambulatory Visit
Admission: RE | Admit: 2022-04-15 | Discharge: 2022-04-15 | Disposition: A | Discharge status: Home / Self Care | Payer: Medicare HMO | Source: Ambulatory Visit | Attending: Physician Assistant | Admitting: Physician Assistant

## 2022-04-15 DIAGNOSIS — K118 Other diseases of salivary glands: Secondary | ICD-10-CM

## 2022-04-15 DIAGNOSIS — R590 Localized enlarged lymph nodes: Secondary | ICD-10-CM | POA: Diagnosis not present

## 2022-04-20 ENCOUNTER — Other Ambulatory Visit: Payer: Self-pay | Admitting: Physician Assistant

## 2022-04-20 DIAGNOSIS — K118 Other diseases of salivary glands: Secondary | ICD-10-CM

## 2022-05-07 ENCOUNTER — Encounter (INDEPENDENT_AMBULATORY_CARE_PROVIDER_SITE_OTHER): Payer: Medicare HMO | Admitting: Ophthalmology

## 2022-05-07 DIAGNOSIS — I10 Essential (primary) hypertension: Secondary | ICD-10-CM

## 2022-05-07 DIAGNOSIS — D3132 Benign neoplasm of left choroid: Secondary | ICD-10-CM | POA: Diagnosis not present

## 2022-05-07 DIAGNOSIS — H35032 Hypertensive retinopathy, left eye: Secondary | ICD-10-CM

## 2022-05-07 DIAGNOSIS — H43812 Vitreous degeneration, left eye: Secondary | ICD-10-CM | POA: Diagnosis not present

## 2022-05-07 DIAGNOSIS — H353122 Nonexudative age-related macular degeneration, left eye, intermediate dry stage: Secondary | ICD-10-CM | POA: Diagnosis not present

## 2022-05-12 ENCOUNTER — Telehealth: Payer: Self-pay

## 2022-05-12 ENCOUNTER — Ambulatory Visit: Payer: Medicare HMO

## 2022-05-12 NOTE — Telephone Encounter (Signed)
Called patient lvm to return call; no return call. Spoke with son, he will have patient to call office to reschedule..  If patient returns call, patient may reschedule for the next available appointment.  -S. Rease Wence,LPN

## 2022-05-17 ENCOUNTER — Ambulatory Visit
Admission: RE | Admit: 2022-05-17 | Discharge: 2022-05-17 | Disposition: A | Discharge status: Home / Self Care | Payer: Medicare HMO | Source: Ambulatory Visit | Attending: Physician Assistant | Admitting: Physician Assistant

## 2022-05-17 ENCOUNTER — Telehealth: Payer: Self-pay | Admitting: Internal Medicine

## 2022-05-17 DIAGNOSIS — K118 Other diseases of salivary glands: Secondary | ICD-10-CM

## 2022-05-17 DIAGNOSIS — R221 Localized swelling, mass and lump, neck: Secondary | ICD-10-CM | POA: Diagnosis not present

## 2022-05-17 DIAGNOSIS — M47812 Spondylosis without myelopathy or radiculopathy, cervical region: Secondary | ICD-10-CM | POA: Diagnosis not present

## 2022-05-17 DIAGNOSIS — I6522 Occlusion and stenosis of left carotid artery: Secondary | ICD-10-CM | POA: Diagnosis not present

## 2022-05-17 NOTE — Telephone Encounter (Signed)
Patient dropped off placard form to be filled out and I placed in providers basket up front. Patient requested a call at [phone] when done

## 2022-05-20 NOTE — Telephone Encounter (Signed)
Form completion in progress 

## 2022-05-21 NOTE — Telephone Encounter (Signed)
Patient picked up.

## 2022-05-21 NOTE — Telephone Encounter (Signed)
Form completed and placed at front office,patient notified via voicemail.

## 2022-05-28 ENCOUNTER — Ambulatory Visit: Payer: Medicare HMO | Admitting: Podiatry

## 2022-05-28 ENCOUNTER — Encounter: Payer: Self-pay | Admitting: Podiatry

## 2022-05-28 DIAGNOSIS — M79674 Pain in right toe(s): Secondary | ICD-10-CM

## 2022-05-28 DIAGNOSIS — M79675 Pain in left toe(s): Secondary | ICD-10-CM | POA: Diagnosis not present

## 2022-05-28 DIAGNOSIS — N184 Chronic kidney disease, stage 4 (severe): Secondary | ICD-10-CM

## 2022-05-28 DIAGNOSIS — B351 Tinea unguium: Secondary | ICD-10-CM | POA: Diagnosis not present

## 2022-05-28 NOTE — Progress Notes (Signed)
This patient presents  to my office for at risk foot care.  This patient requires this care by a professional since this patient will be at risk due to having diabetes and CKD.  This patient is unable to cut nails himself since the patient cannot reach his nails.These nails are painful walking and wearing shoes.  This patient presents for at risk foot care today.  General Appearance  Alert, conversant and in no acute stress.  Vascular  Dorsalis pedis and posterior tibial  pulses are weakly palpable  bilaterally.  Capillary return is within normal limits  bilaterally. Temperature is within normal limits  bilaterally.  Neurologic  Senn-Weinstein monofilament wire test within normal limits  bilaterally. Muscle power within normal limits bilaterally.  Nails Thick disfigured discolored nails with subungual debris  from hallux to fifth toes bilaterally. No evidence of bacterial infection or drainage bilaterally.  Orthopedic  No limitations of motion  feet .  No crepitus or effusions noted.  No bony pathology or digital deformities noted.  Skin  normotropic skin with no porokeratosis noted bilaterally.  No signs of infections or ulcers noted.     Onychomycosis  Pain in right toes  Pain in left toes  Consent was obtained for treatment procedures.   Mechanical debridement of nails 1-5  bilaterally performed with a nail nipper.  Filed with dremel without incident.    Return office visit   3 months                   Told patient to return for periodic foot care and evaluation due to potential at risk complications.   Laurenashley Viar DPM   

## 2022-06-02 ENCOUNTER — Telehealth: Payer: Self-pay

## 2022-06-02 NOTE — Telephone Encounter (Signed)
Pt is calling to to get Dr. Jenny Reichmann medical opinion if he should take the new COVID booster that is available.   Please advise

## 2022-06-02 NOTE — Telephone Encounter (Signed)
That would be fine in his case given his age and medical conditions

## 2022-06-02 NOTE — Telephone Encounter (Signed)
I advised the pt that Dr. Jenny Reichmann recommended the vaccine given his age and medical conditions.  Pt will be going to [facility]. I advise the pt to call and give [place] the information after he gets the vaccine.  FYI

## 2022-08-02 ENCOUNTER — Encounter: Payer: Self-pay | Admitting: Internal Medicine

## 2022-08-02 ENCOUNTER — Ambulatory Visit (INDEPENDENT_AMBULATORY_CARE_PROVIDER_SITE_OTHER): Payer: Medicare HMO | Admitting: Internal Medicine

## 2022-08-02 ENCOUNTER — Ambulatory Visit (INDEPENDENT_AMBULATORY_CARE_PROVIDER_SITE_OTHER): Payer: Medicare HMO

## 2022-08-02 ENCOUNTER — Other Ambulatory Visit: Payer: Self-pay | Admitting: Internal Medicine

## 2022-08-02 VITALS — BP 136/78 | HR 85 | Temp 97.9°F | Ht 69.0 in | Wt 178.0 lb

## 2022-08-02 DIAGNOSIS — N184 Chronic kidney disease, stage 4 (severe): Secondary | ICD-10-CM

## 2022-08-02 DIAGNOSIS — R000 Tachycardia, unspecified: Secondary | ICD-10-CM

## 2022-08-02 DIAGNOSIS — E559 Vitamin D deficiency, unspecified: Secondary | ICD-10-CM | POA: Diagnosis not present

## 2022-08-02 DIAGNOSIS — R7309 Other abnormal glucose: Secondary | ICD-10-CM

## 2022-08-02 DIAGNOSIS — D638 Anemia in other chronic diseases classified elsewhere: Secondary | ICD-10-CM | POA: Diagnosis not present

## 2022-08-02 DIAGNOSIS — R079 Chest pain, unspecified: Secondary | ICD-10-CM

## 2022-08-02 DIAGNOSIS — R0602 Shortness of breath: Secondary | ICD-10-CM | POA: Diagnosis not present

## 2022-08-02 DIAGNOSIS — I10 Essential (primary) hypertension: Secondary | ICD-10-CM

## 2022-08-02 DIAGNOSIS — R053 Chronic cough: Secondary | ICD-10-CM | POA: Diagnosis not present

## 2022-08-02 DIAGNOSIS — R002 Palpitations: Secondary | ICD-10-CM | POA: Diagnosis not present

## 2022-08-02 LAB — CBC WITH DIFFERENTIAL/PLATELET
Basophils Absolute: 0 10*3/uL (ref 0.0–0.1)
Basophils Relative: 0.5 % (ref 0.0–3.0)
Eosinophils Absolute: 0.1 10*3/uL (ref 0.0–0.7)
Eosinophils Relative: 2.9 % (ref 0.0–5.0)
HCT: 30.4 % — ABNORMAL LOW (ref 39.0–52.0)
Hemoglobin: 10.3 g/dL — ABNORMAL LOW (ref 13.0–17.0)
Lymphocytes Relative: 39.9 % (ref 12.0–46.0)
Lymphs Abs: 1.8 10*3/uL (ref 0.7–4.0)
MCHC: 34 g/dL (ref 30.0–36.0)
MCV: 95.7 fl (ref 78.0–100.0)
Monocytes Absolute: 0.5 10*3/uL (ref 0.1–1.0)
Monocytes Relative: 10.8 % (ref 3.0–12.0)
Neutro Abs: 2 10*3/uL (ref 1.4–7.7)
Neutrophils Relative %: 45.9 % (ref 43.0–77.0)
Platelets: 169 10*3/uL (ref 150.0–400.0)
RBC: 3.17 Mil/uL — ABNORMAL LOW (ref 4.22–5.81)
RDW: 14 % (ref 11.5–15.5)
WBC: 4.5 10*3/uL (ref 4.0–10.5)

## 2022-08-02 LAB — HEPATIC FUNCTION PANEL
ALT: 21 U/L (ref 0–53)
AST: 23 U/L (ref 0–37)
Albumin: 4 g/dL (ref 3.5–5.2)
Alkaline Phosphatase: 67 U/L (ref 39–117)
Bilirubin, Direct: 0.1 mg/dL (ref 0.0–0.3)
Total Bilirubin: 0.3 mg/dL (ref 0.2–1.2)
Total Protein: 6.7 g/dL (ref 6.0–8.3)

## 2022-08-02 LAB — BASIC METABOLIC PANEL
BUN: 32 mg/dL — ABNORMAL HIGH (ref 6–23)
CO2: 25 mEq/L (ref 19–32)
Calcium: 8.7 mg/dL (ref 8.4–10.5)
Chloride: 108 mEq/L (ref 96–112)
Creatinine, Ser: 2.3 mg/dL — ABNORMAL HIGH (ref 0.40–1.50)
GFR: 24.96 mL/min — ABNORMAL LOW (ref >60.00)
Glucose, Bld: 109 mg/dL — ABNORMAL HIGH (ref 70–99)
Potassium: 3.6 mEq/L (ref 3.5–5.1)
Sodium: 140 mEq/L (ref 135–145)

## 2022-08-02 LAB — FERRITIN: Ferritin: 28.2 ng/mL (ref 22.0–322.0)

## 2022-08-02 LAB — LIPID PANEL
Cholesterol: 128 mg/dL (ref 0–200)
HDL: 60.9 mg/dL (ref >39.00)
LDL Cholesterol: 50 mg/dL (ref 0–99)
NonHDL: 67.35
Total CHOL/HDL Ratio: 2
Triglycerides: 87 mg/dL (ref 0.0–149.0)
VLDL: 17.4 mg/dL (ref 0.0–40.0)

## 2022-08-02 LAB — IBC PANEL
Iron: 55 ug/dL (ref 42–165)
Saturation Ratios: 16.5 % — ABNORMAL LOW (ref 20.0–50.0)
TIBC: 333.2 ug/dL (ref 250.0–450.0)
Transferrin: 238 mg/dL (ref 212.0–360.0)

## 2022-08-02 LAB — HEMOGLOBIN A1C: Hgb A1c MFr Bld: 6.7 % — ABNORMAL HIGH (ref 4.6–6.5)

## 2022-08-02 LAB — TSH: TSH: 2.28 u[IU]/mL (ref 0.35–5.50)

## 2022-08-02 LAB — VITAMIN D 25 HYDROXY (VIT D DEFICIENCY, FRACTURES): VITD: 52.63 ng/mL (ref 30.00–100.00)

## 2022-08-02 NOTE — Assessment & Plan Note (Signed)
Lab Results  Component Value Date   HGB 10.3 (L) 08/02/2022   Stable, cont same tx

## 2022-08-02 NOTE — Assessment & Plan Note (Signed)
Last vitamin D Lab Results  Component Value Date   VD25OH 52.63 08/02/2022   Stable, cont oral replacement

## 2022-08-02 NOTE — Assessment & Plan Note (Signed)
BP Readings from Last 3 Encounters:  08/02/22 136/78  03/22/22 140/70  12/23/21 (!) 150/74   Stable, pt to continue medical treatment norvasc 10 mg qd, coreg 3.125 mg bid

## 2022-08-02 NOTE — Assessment & Plan Note (Signed)
Atypical, etiology unclear, cannot r/o cardiac, has declines stress test last yr, now for cardiology referral

## 2022-08-02 NOTE — Patient Instructions (Addendum)
Your EKG was OK today  Please continue all other medications as before, and refills have been done if requested.  Please have the pharmacy call with any other refills you may need.  Please keep your appointments with your specialists as you may have planned  You will be contacted regarding the referral for: Cardiac Event monitor (for heart rhythm), and cardiology  Please go to the XRAY Department in the first floor for the x-ray testing  Please go to the LAB at the blood drawing area for the tests to be done  You will be contacted by phone if any changes need to be made immediately.  Otherwise, you will receive a letter about your results with an explanation, but please check with MyChart first.  Please remember to sign up for MyChart if you have not done so, as this will be important to you in the future with finding out test results, communicating by private email, and scheduling acute appointments online when needed.   Please make an Appointment to return in 3 months, or sooner if needed
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
## 2022-08-02 NOTE — Assessment & Plan Note (Signed)
Lab Results  Component Value Date   HGBA1C 6.7 (H) 08/02/2022   Stable, pt to continue current medical treatment  - diet, wt control, excercsie

## 2022-08-02 NOTE — Assessment & Plan Note (Signed)
Lab Results  Component Value Date   CREATININE 2.30 (H) 08/02/2022   Stable overall, cont to avoid nephrotoxins

## 2022-08-02 NOTE — Assessment & Plan Note (Addendum)
ECG reviewed, etiology unclear, cannot r/o PAF - for cardiac event monitor, also cxr today, and lab including cbc
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
## 2022-08-06 ENCOUNTER — Ambulatory Visit: Payer: Medicare HMO | Attending: Physician Assistant | Admitting: Physician Assistant

## 2022-08-06 ENCOUNTER — Encounter: Payer: Self-pay | Admitting: Physician Assistant

## 2022-08-06 VITALS — BP 136/64 | HR 74 | Ht 68.5 in | Wt 181.4 lb

## 2022-08-06 DIAGNOSIS — E782 Mixed hyperlipidemia: Secondary | ICD-10-CM

## 2022-08-06 DIAGNOSIS — I779 Disorder of arteries and arterioles, unspecified: Secondary | ICD-10-CM | POA: Diagnosis not present

## 2022-08-06 DIAGNOSIS — E039 Hypothyroidism, unspecified: Secondary | ICD-10-CM

## 2022-08-06 DIAGNOSIS — I10 Essential (primary) hypertension: Secondary | ICD-10-CM | POA: Diagnosis not present

## 2022-08-06 DIAGNOSIS — R7309 Other abnormal glucose: Secondary | ICD-10-CM

## 2022-08-06 MED ORDER — CARVEDILOL 6.25 MG PO TABS: 6.2500 mg | ORAL_TABLET | Freq: Two times a day (BID) | ORAL | 3 refills | Status: DC

## 2022-08-06 NOTE — Patient Instructions (Signed)
Medication Instructions:  1.Increase carvedilol to 6.25 mg twice daily *If you need a refill on your cardiac medications before your next appointment, please call your pharmacy*   Lab Work: None If you have labs (blood work) drawn today and your tests are completely normal, you will receive your results only by: [place] (if you have MyChart) OR A paper copy in the mail If you have any lab test that is abnormal or we need to change your treatment, we will call you to review the results.   Follow-Up: At [facility], you and your health needs are our priority.  As part of our continuing mission to provide you with exceptional heart care, we have created designated Provider Care Teams.  These Care Teams include your primary Cardiologist (physician) and Advanced Practice Providers (APPs -  Physician Assistants and Nurse Practitioners) who all work together to provide you with the care you need, when you need it.  Your next appointment:   3 month(s)  The format for your next appointment:   In Person  Provider:   Nicholes Rough, PA-C        Other Instructions 1.Schedule a nurse visit in one week for a blood pressure check. Please bring your blood pressure machine and blood pressure log to this visit.  2.Check your blood pressure daily, one hour after taking your morning medications for the next week and keep a log to bring with you to your nurse visit next week.  Important Information About Sugar

## 2022-08-16 ENCOUNTER — Ambulatory Visit: Payer: Medicare HMO | Attending: Cardiovascular Disease | Admitting: Physician Assistant

## 2022-08-16 ENCOUNTER — Ambulatory Visit: Payer: Medicare HMO | Attending: Internal Medicine

## 2022-08-16 VITALS — BP 140/72 | HR 75 | Resp 20 | Wt 179.0 lb

## 2022-08-16 DIAGNOSIS — I10 Essential (primary) hypertension: Secondary | ICD-10-CM

## 2022-08-16 DIAGNOSIS — R000 Tachycardia, unspecified: Secondary | ICD-10-CM

## 2022-08-16 DIAGNOSIS — R079 Chest pain, unspecified: Secondary | ICD-10-CM | POA: Diagnosis not present

## 2022-08-16 DIAGNOSIS — R002 Palpitations: Secondary | ICD-10-CM

## 2022-08-16 NOTE — Patient Instructions (Signed)
   Nurse Visit   Date of Encounter: 08/16/2022 ID: [name], DOB [birth year], MRN [id number]  PCP:  Biagio Borg, MD   [place] [facility] Cardiologist:  [name], MD      Visit Details   VS:  BP (!) 140/72 (BP Location: Right Arm) Comment (BP Location): Pt home BP machine  Pulse 75   Resp 20   Wt 179 lb 0.4 oz (81.2 kg)   SpO2 99%   BMI 26.82 kg/m  , BMI Body mass index is 26.82 kg/m.  Wt Readings from Last 3 Encounters:  08/16/22 179 lb 0.4 oz (81.2 kg)  08/06/22 181 lb 6.4 oz (82.3 kg)  08/02/22 178 lb (80.7 kg)     Reason for visit: RN Visit BP assessment  Performed today: Vital Signs, BP assessment ordered, Ms. Nicholes Rough, PA-C Changes (medications, testing, etc.) : Coreg 6.25 mg BID Length of Visit: 30 minutes    Medications Adjustments/Labs and Tests Ordered: Pt came in this afternoon 08/16/2022 at 200 pm for RN visit, BP assessment vs home BP cuff.  Pt started on 6.25 mg Coreg BID per Ms. Nicholes Rough, PA-C on 08/06/2022.  Pt 1st BP: 132/70 manual Lt arm;  2nd, BP: 145/78 Lt arm home machine, 3rd BP: 126/68 Rt arm manual, and 4th BP: 140/72 Rt arm, home BP machine.  Pt home BP machine read 12-14 points higher systolic... this information was shared with Pt.  Pt understood, and stated he may look into getting another BP cuff that reads more accurate.  Pt brought in home BP readings on formed received at [facility] visit.  I had front desk scan data / document into Epic.  BP and vital signs WNL;  Will forward this report to Ms. Harriet Pho PA-C for review.  Pt advised to contact us with any new or worsening symptoms.  Pt understood, and will continue to follow his plan of care.      Signed, Varney Daily, RN  08/16/2022 2:37 PM

## 2022-08-27 ENCOUNTER — Ambulatory Visit: Payer: Medicare HMO | Admitting: Podiatry

## 2022-08-27 ENCOUNTER — Encounter: Payer: Self-pay | Admitting: Podiatry

## 2022-08-27 DIAGNOSIS — R7309 Other abnormal glucose: Secondary | ICD-10-CM

## 2022-08-27 DIAGNOSIS — B351 Tinea unguium: Secondary | ICD-10-CM | POA: Diagnosis not present

## 2022-08-27 DIAGNOSIS — M79674 Pain in right toe(s): Secondary | ICD-10-CM | POA: Diagnosis not present

## 2022-08-27 DIAGNOSIS — I872 Venous insufficiency (chronic) (peripheral): Secondary | ICD-10-CM

## 2022-08-27 DIAGNOSIS — N184 Chronic kidney disease, stage 4 (severe): Secondary | ICD-10-CM | POA: Diagnosis not present

## 2022-08-27 DIAGNOSIS — M79675 Pain in left toe(s): Secondary | ICD-10-CM | POA: Diagnosis not present

## 2022-08-27 NOTE — Progress Notes (Signed)
This patient presents  to my office for at risk foot care.  This patient requires this care by a professional since this patient will be at risk due to having diabetes and CKD.  This patient is unable to cut nails himself since the patient cannot reach his nails.These nails are painful walking and wearing shoes.  This patient presents for at risk foot care today.  General Appearance  Alert, conversant and in no acute stress.  Vascular  Dorsalis pedis and posterior tibial  pulses are weakly palpable  bilaterally.  Capillary return is within normal limits  bilaterally. Temperature is within normal limits  bilaterally.  Neurologic  Senn-Weinstein monofilament wire test within normal limits  bilaterally. Muscle power within normal limits bilaterally.  Nails Thick disfigured discolored nails with subungual debris  from hallux to fifth toes bilaterally. No evidence of bacterial infection or drainage bilaterally.  Orthopedic  No limitations of motion  feet .  No crepitus or effusions noted.  No bony pathology or digital deformities noted.  Skin  normotropic skin with no porokeratosis noted bilaterally.  No signs of infections or ulcers noted.     Onychomycosis  Pain in right toes  Pain in left toes  Consent was obtained for treatment procedures.   Mechanical debridement of nails 1-5  bilaterally performed with a nail nipper.  Filed with dremel without incident.    Return office visit   3 months                   Told patient to return for periodic foot care and evaluation due to potential at risk complications.   Gardiner Barefoot DPM

## 2022-09-20 ENCOUNTER — Other Ambulatory Visit: Payer: Self-pay | Admitting: Internal Medicine

## 2022-09-22 ENCOUNTER — Ambulatory Visit: Payer: Medicare HMO | Admitting: Internal Medicine

## 2022-10-05 NOTE — Progress Notes (Signed)
Nurse visit

## 2022-10-07 DIAGNOSIS — N481 Balanitis: Secondary | ICD-10-CM | POA: Diagnosis not present

## 2022-10-11 ENCOUNTER — Other Ambulatory Visit: Payer: Self-pay | Admitting: Internal Medicine

## 2022-10-13 ENCOUNTER — Telehealth: Payer: Self-pay | Admitting: Internal Medicine

## 2022-10-13 MED ORDER — AMLODIPINE BESYLATE 10 MG PO TABS: 10.0000 mg | ORAL_TABLET | Freq: Every day | ORAL | 1 refills | Status: DC

## 2022-10-13 NOTE — Telephone Encounter (Signed)
Refilled amlodipine @ [email or web address] KitchenJohny [name]

## 2022-10-13 NOTE — Telephone Encounter (Signed)
Patient stopped by office states he needs a refill on his amlodipine 36m. He would like it filled at the [facility]

## 2022-10-19 ENCOUNTER — Telehealth: Payer: Self-pay | Admitting: Internal Medicine

## 2022-10-19 NOTE — Telephone Encounter (Signed)
Pt called wanted if Dr. Jenny Reichmann can send him Rx in. Pt complaining he has a lot of mucus in his throat.  Pt ask for nurse to give him a call back with update.

## 2022-10-20 NOTE — Telephone Encounter (Signed)
Called pt made appt for tomorrow @ 9:20.Greg KitchenJohny Chess

## 2022-10-21 ENCOUNTER — Ambulatory Visit (INDEPENDENT_AMBULATORY_CARE_PROVIDER_SITE_OTHER): Payer: Medicare Other | Admitting: Internal Medicine

## 2022-10-21 VITALS — BP 120/68 | HR 67 | Temp 97.6°F | Ht 68.5 in | Wt 181.0 lb

## 2022-10-21 DIAGNOSIS — I10 Essential (primary) hypertension: Secondary | ICD-10-CM | POA: Diagnosis not present

## 2022-10-21 DIAGNOSIS — R0981 Nasal congestion: Secondary | ICD-10-CM | POA: Insufficient documentation

## 2022-10-21 DIAGNOSIS — R7309 Other abnormal glucose: Secondary | ICD-10-CM

## 2022-10-21 MED ORDER — FEXOFENADINE HCL 180 MG PO TABS: 180.0000 mg | ORAL_TABLET | Freq: Every day | ORAL | 3 refills | Status: AC

## 2022-10-21 MED ORDER — TRIAMCINOLONE ACETONIDE 55 MCG/ACT NA AERO: 2.0000 | INHALATION_SPRAY | Freq: Every day | NASAL | 12 refills | Status: DC
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
## 2022-10-21 NOTE — Patient Instructions (Signed)
Please take all new medication as prescribed - the allegra (antihistamine) and Nasacort (nasal spray) for the nose and sinus congestoin  You can also take Mucinex (or it's generic off brand) for congestion, and tylenol as needed for pain.  Please continue all other medications as before, and refills have been done if requested.  Please have the pharmacy call with any other refills you may need.  Please continue your efforts at being more active, low cholesterol diet, and weight control.  Please keep your appointments with your specialists as you may have planned

## 2022-10-25 ENCOUNTER — Ambulatory Visit (INDEPENDENT_AMBULATORY_CARE_PROVIDER_SITE_OTHER): Payer: Medicare Other | Admitting: Orthopedic Surgery

## 2022-10-25 ENCOUNTER — Ambulatory Visit (INDEPENDENT_AMBULATORY_CARE_PROVIDER_SITE_OTHER): Payer: Medicare Other

## 2022-10-25 DIAGNOSIS — Z96653 Presence of artificial knee joint, bilateral: Secondary | ICD-10-CM | POA: Diagnosis not present

## 2022-10-25 DIAGNOSIS — M25562 Pain in left knee: Secondary | ICD-10-CM | POA: Diagnosis not present

## 2022-10-26 ENCOUNTER — Encounter: Payer: Self-pay | Admitting: Orthopedic Surgery
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
## 2022-10-27 ENCOUNTER — Encounter: Payer: Self-pay | Admitting: Internal Medicine

## 2022-10-27 NOTE — Assessment & Plan Note (Addendum)
Mild to mod, for allegra 180 mg qd prn, and nasacort asd, also mucinex bid prn, to f/u any worsening symptoms or concerns

## 2022-10-27 NOTE — Assessment & Plan Note (Signed)
BP Readings from Last 3 Encounters:  10/21/22 120/68  08/16/22 (!) 140/72  08/06/22 136/64   Stable, pt to continue medical treatment norvasc 10 mg qd, coreg 6.25 bid

## 2022-10-27 NOTE — Assessment & Plan Note (Signed)
Lab Results  Component Value Date   HGBA1C 6.7 (H) 08/02/2022   Mild uncontrolled creeping up recent, goal A1c < 7.5,, pt to continue current medical treatment - diet, wt control

## 2022-10-29 ENCOUNTER — Other Ambulatory Visit: Payer: Self-pay | Admitting: *Deleted

## 2022-10-29 DIAGNOSIS — I6521 Occlusion and stenosis of right carotid artery: Secondary | ICD-10-CM

## 2022-11-09 ENCOUNTER — Telehealth: Payer: Self-pay | Admitting: Internal Medicine

## 2022-11-09 ENCOUNTER — Ambulatory Visit (INDEPENDENT_AMBULATORY_CARE_PROVIDER_SITE_OTHER): Payer: Medicare Other | Admitting: Physician Assistant

## 2022-11-09 ENCOUNTER — Encounter: Payer: Self-pay | Admitting: Physician Assistant

## 2022-11-09 VITALS — BP 138/80 | HR 78 | Ht 68.5 in | Wt 180.6 lb

## 2022-11-09 DIAGNOSIS — E039 Hypothyroidism, unspecified: Secondary | ICD-10-CM

## 2022-11-09 DIAGNOSIS — R7309 Other abnormal glucose: Secondary | ICD-10-CM | POA: Diagnosis not present

## 2022-11-09 DIAGNOSIS — I10 Essential (primary) hypertension: Secondary | ICD-10-CM

## 2022-11-09 DIAGNOSIS — R002 Palpitations: Secondary | ICD-10-CM

## 2022-11-09 DIAGNOSIS — E785 Hyperlipidemia, unspecified: Secondary | ICD-10-CM

## 2022-11-09 NOTE — Patient Instructions (Signed)
Medication Instructions:  Your physician recommends that you continue on your current medications as directed. Please refer to the Current Medication list given to you today.  *If you need a refill on your cardiac medications before your next appointment, please call your pharmacy*   Lab Work: None If you have labs (blood work) drawn today and your tests are completely normal, you will receive your results only by: [place] (if you have MyChart) OR A paper copy in the mail If you have any lab test that is abnormal or we need to change your treatment, we will call you to review the results.   Follow-Up: At [facility], you and your health needs are our priority.  As part of our continuing mission to provide you with exceptional heart care, we have created designated Provider Care Teams.  These Care Teams include your primary Cardiologist (physician) and Advanced Practice Providers (APPs -  Physician Assistants and Nurse Practitioners) who all work together to provide you with the care you need, when you need it.  Your next appointment:   6 month(s)  Provider:   [name], MD    Diabetes Mellitus and Nutrition, Adult When you have diabetes, or diabetes mellitus, it is very important to have healthy eating habits because your blood sugar (glucose) levels are greatly affected by what you eat and drink. Eating healthy foods in the right amounts, at about the same times every day, can help you: Manage your blood glucose. Lower your risk of heart disease. Improve your blood pressure. Reach or maintain a healthy weight. What can affect my meal plan? Every person with diabetes is different, and each person has different needs for a meal plan. Your health care provider may recommend that you work with a dietitian to make a meal plan that is best for you. Your meal plan may vary depending on factors such as: The calories you need. The medicines you take. Your weight. Your  blood glucose, blood pressure, and cholesterol levels. Your activity level. Other health conditions you have, such as heart or kidney disease. How do carbohydrates affect me? Carbohydrates, also called carbs, affect your blood glucose level more than any other type of food. Eating carbs raises the amount of glucose in your blood. It is important to know how many carbs you can safely have in each meal. This is different for every person. Your dietitian can help you calculate how many carbs you should have at each meal and for each snack. How does alcohol affect me? Alcohol can cause a decrease in blood glucose (hypoglycemia), especially if you use insulin or take certain diabetes medicines by mouth. Hypoglycemia can be a life-threatening condition. Symptoms of hypoglycemia, such as sleepiness, dizziness, and confusion, are similar to symptoms of having too much alcohol. Do not drink alcohol if: Your health care provider tells you not to drink. You are pregnant, may be pregnant, or are planning to become pregnant. If you drink alcohol: Limit how much you have to: 0-1 drink a day for women. 0-2 drinks a day for men. Know how much alcohol is in your drink. In the [place] one drink equals one 12 oz bottle of beer (355 mL), one 5 oz glass of wine (148 mL), or one 1 oz glass of hard liquor (44 mL). Keep yourself hydrated with water, diet soda, or unsweetened iced tea. Keep in mind that regular soda, juice, and other mixers may contain a lot of sugar and must be counted as carbs.  What are tips for following this plan?  Reading food labels Start by checking the serving size on the Nutrition Facts label of packaged foods and drinks. The number of calories and the amount of carbs, fats, and other nutrients listed on the label are based on one serving of the item. Many items contain more than one serving per package. Check the total grams (g) of carbs in one serving. Check the number of grams of saturated  fats and trans fats in one serving. Choose foods that have a low amount or none of these fats. Check the number of milligrams (mg) of salt (sodium) in one serving. Most people should limit total sodium intake to less than 2,300 mg per day. Always check the nutrition information of foods labeled as "low-fat" or "nonfat." These foods may be higher in added sugar or refined carbs and should be avoided. Talk to your dietitian to identify your daily goals for nutrients listed on the label. Shopping Avoid buying canned, pre-made, or processed foods. These foods tend to be high in fat, sodium, and added sugar. Shop around the outside edge of the grocery store. This is where you will most often find fresh fruits and vegetables, bulk grains, fresh meats, and fresh dairy products. Cooking Use low-heat cooking methods, such as baking, instead of high-heat cooking methods, such as deep frying. Cook using healthy oils, such as olive, canola, or sunflower oil. Avoid cooking with butter, cream, or high-fat meats. Meal planning Eat meals and snacks regularly, preferably at the same times every day. Avoid going long periods of time without eating. Eat foods that are high in fiber, such as fresh fruits, vegetables, beans, and whole grains. Eat 4-6 oz (112-168 g) of lean protein each day, such as lean meat, chicken, fish, eggs, or tofu. One ounce (oz) (28 g) of lean protein is equal to: 1 oz (28 g) of meat, chicken, or fish. 1 egg.  cup (62 g) of tofu. Eat some foods each day that contain healthy fats, such as avocado, nuts, seeds, and fish. What foods should I eat? Fruits Berries. Apples. Oranges. Peaches. Apricots. Plums. Grapes. Mangoes. Papayas. Pomegranates. Kiwi. Cherries. Vegetables Leafy greens, including lettuce, spinach, kale, chard, collard greens, mustard greens, and cabbage. Beets. Cauliflower. Broccoli. Carrots. Green beans. Tomatoes. Peppers. Onions. Cucumbers. Brussels sprouts. Grains Whole  grains, such as whole-wheat or whole-grain bread, crackers, tortillas, cereal, and pasta. Unsweetened oatmeal. Quinoa. Brown or wild rice. Meats and other proteins Seafood. Poultry without skin. Lean cuts of poultry and beef. Tofu. Nuts. Seeds. Dairy Low-fat or fat-free dairy products such as milk, yogurt, and cheese. The items listed above may not be a complete list of foods and beverages you can eat and drink. Contact a dietitian for more information. What foods should I avoid? Fruits Fruits canned with syrup. Vegetables Canned vegetables. Frozen vegetables with butter or cream sauce. Grains Refined white flour and flour products such as bread, pasta, snack foods, and cereals. Avoid all processed foods. Meats and other proteins Fatty cuts of meat. Poultry with skin. Breaded or fried meats. Processed meat. Avoid saturated fats. Dairy Full-fat yogurt, cheese, or milk. Beverages Sweetened drinks, such as soda or iced tea. The items listed above may not be a complete list of foods and beverages you should avoid. Contact a dietitian for more information. Questions to ask a health care provider Do I need to meet with a certified diabetes care and education specialist? Do I need to meet with a dietitian? What number can  I call if I have questions? When are the best times to check my blood glucose? Where to find more information: American Diabetes Association: diabetes.org Academy of Nutrition and Dietetics: eatright.[employer] of Diabetes and Digestive and Kidney Diseases: [employer]is Association of Diabetes Care & Education Specialists: diabeteseducator.org Summary It is important to have healthy eating habits because your blood sugar (glucose) levels are greatly affected by what you eat and drink. It is important to use alcohol carefully. A healthy meal plan will help you manage your blood glucose and lower your risk of heart disease. Your health care provider may  recommend that you work with a dietitian to make a meal plan that is best for you. This information is not intended to replace advice given to you by your health care provider. Make sure you discuss any questions you have with your health care provider. Document Revised: 03/19/2020 Document Reviewed: 03/19/2020 Elsevier Patient Education  [place].  Low-Sodium Eating Plan Sodium, which is an element that makes up salt, helps you maintain a healthy balance of fluids in your body. Too much sodium can increase your blood pressure and cause fluid and waste to be held in your body. Your health care provider or dietitian may recommend following this plan if you have high blood pressure (hypertension), kidney disease, liver disease, or heart failure. Eating less sodium can help lower your blood pressure, reduce swelling, and protect your heart, liver, and kidneys. What are tips for following this plan? Reading food labels The Nutrition Facts label lists the amount of sodium in one serving of the food. If you eat more than one serving, you must multiply the listed amount of sodium by the number of servings. Choose foods with less than 140 mg of sodium per serving. Avoid foods with 300 mg of sodium or more per serving. Shopping  Look for lower-sodium products, often labeled as "low-sodium" or "no salt added." Always check the sodium content, even if foods are labeled as "unsalted" or "no salt added." Buy fresh foods. Avoid canned foods and pre-made or frozen meals. Avoid canned, cured, or processed meats. Buy breads that have less than 80 mg of sodium per slice. Cooking  Eat more home-cooked food and less restaurant, buffet, and fast food. Avoid adding salt when cooking. Use salt-free seasonings or herbs instead of table salt or sea salt. Check with your health care provider or pharmacist before using salt substitutes. Cook with plant-based oils, such as canola, sunflower, or olive  oil. Meal planning When eating at a restaurant, ask that your food be prepared with less salt or no salt, if possible. Avoid dishes labeled as brined, pickled, cured, smoked, or made with soy sauce, miso, or teriyaki sauce. Avoid foods that contain MSG (monosodium glutamate). MSG is sometimes added to [place] food, bouillon, and some canned foods. Make meals that can be grilled, baked, poached, roasted, or steamed. These are generally made with less sodium. General information Most people on this plan should limit their sodium intake to 1,500-2,000 mg (milligrams) of sodium each day. What foods should I eat? Fruits Fresh, frozen, or canned fruit. Fruit juice. Vegetables Fresh or frozen vegetables. "No salt added" canned vegetables. "No salt added" tomato sauce and paste. Low-sodium or reduced-sodium tomato and vegetable juice. Grains Low-sodium cereals, including oats, puffed wheat and rice, and shredded wheat. Low-sodium crackers. Unsalted rice. Unsalted pasta. Low-sodium bread. Whole-grain breads and whole-grain pasta. Meats and other proteins Fresh or frozen (no salt added) meat, poultry, seafood, and  fish. Low-sodium canned tuna and salmon. Unsalted nuts. Dried peas, beans, and lentils without added salt. Unsalted canned beans. Eggs. Unsalted nut butters. Dairy Milk. Soy milk. Cheese that is naturally low in sodium, such as ricotta cheese, fresh mozzarella, or Swiss cheese. Low-sodium or reduced-sodium cheese. Cream cheese. Yogurt. Seasonings and condiments Fresh and dried herbs and spices. Salt-free seasonings. Low-sodium mustard and ketchup. Sodium-free salad dressing. Sodium-free light mayonnaise. Fresh or refrigerated horseradish. Lemon juice. Vinegar. Other foods Homemade, reduced-sodium, or low-sodium soups. Unsalted popcorn and pretzels. Low-salt or salt-free chips. The items listed above may not be a complete list of foods and beverages you can eat. Contact a dietitian for more  information. What foods should I avoid? Vegetables Sauerkraut, pickled vegetables, and relishes. Olives. [place] fries. Onion rings. Regular canned vegetables (not low-sodium or reduced-sodium). Regular canned tomato sauce and paste (not low-sodium or reduced-sodium). Regular tomato and vegetable juice (not low-sodium or reduced-sodium). Frozen vegetables in sauces. Grains Instant hot cereals. Bread stuffing, pancake, and biscuit mixes. Croutons. Seasoned rice or pasta mixes. Noodle soup cups. Boxed or frozen macaroni and cheese. Regular salted crackers. Self-rising flour. Meats and other proteins Meat or fish that is salted, canned, smoked, spiced, or pickled. Precooked or cured meat, such as sausages or meat loaves. Berniece Salines. Ham. Pepperoni. Hot dogs. Corned beef. Chipped beef. Salt pork. Jerky. Pickled herring. Anchovies and sardines. Regular canned tuna. Salted nuts. Dairy Processed cheese and cheese spreads. Hard cheeses. Cheese curds. Blue cheese. Feta cheese. String cheese. Regular cottage cheese. Buttermilk. Canned milk. Fats and oils Salted butter. Regular margarine. Ghee. Bacon fat. Seasonings and condiments Onion salt, garlic salt, seasoned salt, table salt, and sea salt. Canned and packaged gravies. Worcestershire sauce. Tartar sauce. Barbecue sauce. Teriyaki sauce. Soy sauce, including reduced-sodium. Steak sauce. Fish sauce. Oyster sauce. Cocktail sauce. Horseradish that you find on the shelf. Regular ketchup and mustard. Meat flavorings and tenderizers. Bouillon cubes. Hot sauce. Pre-made or packaged marinades. Pre-made or packaged taco seasonings. Relishes. Regular salad dressings. Salsa. Other foods Salted popcorn and pretzels. Corn chips and puffs. Potato and tortilla chips. Canned or dried soups. Pizza. Frozen entrees and pot pies. The items listed above may not be a complete list of foods and beverages you should avoid. Contact a dietitian for more information. Summary Eating less  sodium can help lower your blood pressure, reduce swelling, and protect your heart, liver, and kidneys. Most people on this plan should limit their sodium intake to 1,500-2,000 mg (milligrams) of sodium each day. Canned, boxed, and frozen foods are high in sodium. Restaurant foods, fast foods, and pizza are also very high in sodium. You also get sodium by adding salt to food. Try to cook at home, eat more fresh fruits and vegetables, and eat less fast food and canned, processed, or prepared foods. This information is not intended to replace advice given to you by your health care provider. Make sure you discuss any questions you have with your health care provider. Document Revised: 07/23/2019 Document Reviewed: 07/18/2019 Elsevier Patient Education  [place] Many factors influence your heart health, including eating and exercise habits. Heart health is also called coronary health. Coronary risk increases with abnormal blood fat (lipid) levels. A heart-healthy eating plan includes limiting unhealthy fats, increasing healthy fats, limiting salt (sodium) intake, and making other diet and lifestyle changes. What is my plan? Your health care provider may recommend that: You limit your fat intake to _________% or less of your total calories each  day. You limit your saturated fat intake to _________% or less of your total calories each day. You limit the amount of cholesterol in your diet to less than _________ mg per day. You limit the amount of sodium in your diet to less than _________ mg per day. What are tips for following this plan? Cooking Cook foods using methods other than frying. Baking, boiling, grilling, and broiling are all good options. Other ways to reduce fat include: Removing the skin from poultry. Removing all visible fats from meats. Steaming vegetables in water or broth. Meal planning  At meals, imagine dividing your plate into  fourths: Fill one-half of your plate with vegetables and green salads. Fill one-fourth of your plate with whole grains. Fill one-fourth of your plate with lean protein foods. Eat 2-4 cups of vegetables per day. One cup of vegetables equals 1 cup (91 g) broccoli or cauliflower florets, 2 medium carrots, 1 large bell pepper, 1 large sweet potato, 1 large tomato, 1 medium white potato, 2 cups (150 g) raw leafy greens. Eat 1-2 cups of fruit per day. One cup of fruit equals 1 small apple, 1 large banana, 1 cup (237 g) mixed fruit, 1 large orange,  cup (82 g) dried fruit, 1 cup (240 mL) 100% fruit juice. Eat more foods that contain soluble fiber. Examples include apples, broccoli, carrots, beans, peas, and barley. Aim to get 25-30 g of fiber per day. Increase your consumption of legumes, nuts, and seeds to 4-5 servings per week. One serving of dried beans or legumes equals  cup (90 g) cooked, 1 serving of nuts is  oz (12 almonds, 24 pistachios, or 7 walnut halves), and 1 serving of seeds equals  oz (8 g). Fats Choose healthy fats more often. Choose monounsaturated and polyunsaturated fats, such as olive and canola oils, avocado oil, flaxseeds, walnuts, almonds, and seeds. Eat more omega-3 fats. Choose salmon, mackerel, sardines, tuna, flaxseed oil, and ground flaxseeds. Aim to eat fish at least 2 times each week. Check food labels carefully to identify foods with trans fats or high amounts of saturated fat. Limit saturated fats. These are found in animal products, such as meats, butter, and cream. Plant sources of saturated fats include palm oil, palm kernel oil, and coconut oil. Avoid foods with partially hydrogenated oils in them. These contain trans fats. Examples are stick margarine, some tub margarines, cookies, crackers, and other baked goods. Avoid fried foods. General information Eat more home-cooked food and less restaurant, buffet, and fast food. Limit or avoid alcohol. Limit foods that  are high in added sugar and simple starches such as foods made using white refined flour (white breads, pastries, sweets). Lose weight if you are overweight. Losing just 5-10% of your body weight can help your overall health and prevent diseases such as diabetes and heart disease. Monitor your sodium intake, especially if you have high blood pressure. Talk with your health care provider about your sodium intake. Try to incorporate more vegetarian meals weekly. What foods should I eat? Fruits All fresh, canned (in natural juice), or frozen fruits. Vegetables Fresh or frozen vegetables (raw, steamed, roasted, or grilled). Green salads. Grains Most grains. Choose whole wheat and whole grains most of the time. Rice and pasta, including brown rice and pastas made with whole wheat. Meats and other proteins Lean, well-trimmed beef, veal, pork, and lamb. Chicken and [place] without skin. All fish and shellfish. Wild duck, rabbit, pheasant, and venison. Egg whites or low-cholesterol egg substitutes. Dried beans, peas,  lentils, and tofu. Seeds and most nuts. Dairy Low-fat or nonfat cheeses, including ricotta and mozzarella. Skim or 1% milk (liquid, powdered, or evaporated). Buttermilk made with low-fat milk. Nonfat or low-fat yogurt. Fats and oils Non-hydrogenated (trans-free) margarines. Vegetable oils, including soybean, sesame, sunflower, olive, avocado, peanut, safflower, corn, canola, and cottonseed. Salad dressings or mayonnaise made with a vegetable oil. Beverages Water (mineral or sparkling). Coffee and tea. Unsweetened ice tea. Diet beverages. Sweets and desserts Sherbet, gelatin, and fruit ice. Small amounts of dark chocolate. Limit all sweets and desserts. Seasonings and condiments All seasonings and condiments. The items listed above may not be a complete list of foods and beverages you can eat. Contact a dietitian for more options. What foods should I avoid? Fruits Canned fruit in heavy  syrup. Fruit in cream or butter sauce. Fried fruit. Limit coconut. Vegetables Vegetables cooked in cheese, cream, or butter sauce. Fried vegetables. Grains Breads made with saturated or trans fats, oils, or whole milk. Croissants. Sweet rolls. Donuts. High-fat crackers, such as cheese crackers and chips. Meats and other proteins Fatty meats, such as hot dogs, ribs, sausage, bacon, rib-eye roast or steak. High-fat deli meats, such as salami and bologna. Caviar. Domestic duck and goose. Organ meats, such as liver. Dairy Cream, sour cream, cream cheese, and creamed cottage cheese. Whole-milk cheeses. Whole or 2% milk (liquid, evaporated, or condensed). Whole buttermilk. Cream sauce or high-fat cheese sauce. Whole-milk yogurt. Fats and oils Meat fat, or shortening. Cocoa butter, hydrogenated oils, palm oil, coconut oil, palm kernel oil. Solid fats and shortenings, including bacon fat, salt pork, lard, and butter. Nondairy cream substitutes. Salad dressings with cheese or sour cream. Beverages Regular sodas and any drinks with added sugar. Sweets and desserts Frosting. Pudding. Cookies. Cakes. Pies. Milk chocolate or white chocolate. Buttered syrups. Full-fat ice cream or ice cream drinks. The items listed above may not be a complete list of foods and beverages to avoid. Contact a dietitian for more information. Summary Heart-healthy meal planning includes limiting unhealthy fats, increasing healthy fats, limiting salt (sodium) intake and making other diet and lifestyle changes. Lose weight if you are overweight. Losing just 5-10% of your body weight can help your overall health and prevent diseases such as diabetes and heart disease. Focus on eating a balance of foods, including fruits and vegetables, low-fat or nonfat dairy, lean protein, nuts and legumes, whole grains, and heart-healthy oils and fats. This information is not intended to replace advice given to you by your health care provider. Make  sure you discuss any questions you have with your health care provider. Document Revised: 09/21/2021 Document Reviewed: 09/21/2021 Elsevier Patient Education  [place].
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
## 2022-11-09 NOTE — Telephone Encounter (Signed)
Patient dropped off document Handicap Placard, to be filled out by provider. Patient requested to send it via Call Patient to pick up within 7-days. Document is located in providers tray at front office.Please advise at [place] [phone]

## 2022-11-10 ENCOUNTER — Ambulatory Visit: Payer: Medicare Other | Admitting: Physician Assistant

## 2022-11-10 ENCOUNTER — Ambulatory Visit (HOSPITAL_COMMUNITY)
Admission: RE | Admit: 2022-11-10 | Discharge: 2022-11-10 | Disposition: A | Discharge status: Home / Self Care | Payer: Medicare Other | Source: Ambulatory Visit | Attending: Vascular Surgery | Admitting: Vascular Surgery

## 2022-11-10 VITALS — BP 138/66 | HR 77 | Temp 98.3°F | Ht 70.0 in | Wt 179.0 lb

## 2022-11-10 DIAGNOSIS — I6521 Occlusion and stenosis of right carotid artery: Secondary | ICD-10-CM | POA: Diagnosis not present

## 2022-11-10 DIAGNOSIS — E785 Hyperlipidemia, unspecified: Secondary | ICD-10-CM | POA: Insufficient documentation

## 2022-11-10 DIAGNOSIS — I10 Essential (primary) hypertension: Secondary | ICD-10-CM | POA: Insufficient documentation

## 2022-11-10 DIAGNOSIS — R002 Palpitations: Secondary | ICD-10-CM | POA: Diagnosis not present

## 2022-11-10 DIAGNOSIS — R7309 Other abnormal glucose: Secondary | ICD-10-CM | POA: Insufficient documentation

## 2022-11-10 DIAGNOSIS — E039 Hypothyroidism, unspecified: Secondary | ICD-10-CM | POA: Diagnosis not present

## 2022-11-10 NOTE — Progress Notes (Signed)
Office Visit   History of Present Illness   Greg [name] is a 89 y.o. (08-Apr-1933) male who presents for surveillance of carotid artery stenosis. He underwent right carotid endarterectomy on 11/10/2021 by Dr.Cain. This was done for asymptomatic critical stenosis.  He returns today for follow up. He has been doing well since his last visit. He denies any signs or symptoms of stroke such as slurred speech, facial droop, sudden changes to vision, or sudden weakness/numbness. He also denies claudication, rest pain, or tissue loss.  Current Outpatient Medications  Medication Sig Dispense Refill   Accu-Chek FastClix Lancets MISC TEST ONCE DAILY AS NEEDED 102 each 3   amLODipine (NORVASC) 10 MG tablet Take 1 tablet (10 mg total) by mouth daily. 90 tablet 1   Apoaequorin (PREVAGEN PO) Take 1 tablet by mouth daily.     aspirin EC 81 MG tablet Take 81 mg by mouth daily.     atorvastatin (LIPITOR) 40 MG tablet Take 1 tablet (40 mg total) by mouth daily. 90 tablet 3   Blood Glucose Monitoring Suppl (ACCU-CHEK GUIDE) w/Device KIT USE AS DIRECTED. 1 kit 0   carvedilol (COREG) 6.25 MG tablet Take 1 tablet (6.25 mg total) by mouth 2 (two) times daily. 180 tablet 3   fexofenadine (ALLEGRA) 180 MG tablet Take 1 tablet (180 mg total) by mouth daily. 90 tablet 3   glucose blood (ACCU-CHEK GUIDE) test strip TEST DAILY AS NEEDED 100 strip 2   levothyroxine (SYNTHROID) 50 MCG tablet TAKE 1 TABLET BY MOUTH  DAILY BEFORE BREAKFAST 90 tablet 3   Multiple Vitamins-Minerals (CENTRUM PO) Take 1 tablet by mouth every other day.     pantoprazole (PROTONIX) 40 MG tablet TAKE 1 TABLET BY MOUTH DAILY 90 tablet 3   tamsulosin (FLOMAX) 0.4 MG CAPS capsule Take 1 capsule (0.4 mg total) by mouth daily. 90 capsule 3   No current facility-administered medications for this visit.    REVIEW OF SYSTEMS (negative unless checked):   Cardiac:  []  Chest pain or chest pressure? []  Shortness of breath upon activity? []   Shortness of breath when lying flat? []  Irregular heart rhythm?  Vascular:  []  Pain in calf, thigh, or hip brought on by walking? []  Pain in feet at night that wakes you up from your sleep? []  Blood clot in your veins? []  Leg swelling?  Pulmonary:  []  Oxygen at home? []  Productive cough? []  Wheezing?  Neurologic:  []  Sudden weakness in arms or legs? []  Sudden numbness in arms or legs? []  Sudden onset of difficult speaking or slurred speech? []  Temporary loss of vision in one eye? []  Problems with dizziness?  Gastrointestinal:  []  Blood in stool? []  Vomited blood?  Genitourinary:  []  Burning when urinating? []  Blood in urine?  Psychiatric:  []  Major depression  Hematologic:  []  Bleeding problems? []  Problems with blood clotting?  Dermatologic:  []  Rashes or ulcers?  Constitutional:  []  Fever or chills?  Ear/Nose/Throat:  []  Change in hearing? []  Nose bleeds? []  Sore throat?  Musculoskeletal:  []  Back pain? []  Joint pain? []  Muscle pain?   Physical Examination   Vitals:   11/10/22 1104 11/10/22 1105  BP: (!) 143/76 138/66  Pulse: 77   Temp: 98.3 F (36.8 C)   TempSrc: Temporal   SpO2: 99%   Weight: 179 lb (81.2 kg)   Height: 5\' 10"  (1.778 m)    Body mass index is 25.68 kg/m.  General:  WDWN in NAD; vital signs documented  above Gait: Not observed HENT: WNL, normocephalic Pulmonary: normal non-labored breathing  Cardiac: regular, no carotid bruit Abdomen: soft, NT, no masses Skin: without rashes Vascular Exam/Pulses: palpable radial pulses bilaterally Extremities: without ischemic changes, without Gangrene , without cellulitis; without open wounds;  Musculoskeletal: no muscle wasting or atrophy  Neurologic: A&O X 3;  No focal weakness or paresthesias are detected Psychiatric:  The pt has Normal affect.  Non-Invasive Vascular Imaging   B Carotid Duplex (11/10/2022):  R ICA stenosis:  1-39% R VA:  patent and antegrade L ICA stenosis:   1-39% L VA:  patent and antegrade   Medical Decision Making   Greg [name] is a 89 y.o. male who presents for surveillance of carotid artery stenosis  Based on the patient's vascular studies, his carotid artery stenosis is unchanged bilaterally at 1-39%. He denies any signs or symptoms of stroke. He also denies claudication, rest pain, or tissue loss He has palpable and equal radial pulses. No neuro deficits on exam He can follow up with our office in 1 year with bilateral carotid duplex   Loel Dubonnet PA-C [facility] Office: [phone]  Clinic MD: Randie Heinz

## 2022-11-10 NOTE — Telephone Encounter (Signed)
Done hardcopy to cma 

## 2022-11-10 NOTE — Telephone Encounter (Signed)
Place form in MD tray for signature..Greg [name]

## 2022-11-10 NOTE — Telephone Encounter (Signed)
Called pt no answer LMOM form ready for pick-up.Marland KitchenJohny [name]

## 2022-11-11 ENCOUNTER — Other Ambulatory Visit: Payer: Self-pay

## 2022-11-11 ENCOUNTER — Telehealth: Payer: Self-pay | Admitting: Internal Medicine

## 2022-11-11 MED ORDER — ATORVASTATIN CALCIUM 40 MG PO TABS: 40.0000 mg | ORAL_TABLET | Freq: Every day | ORAL | 3 refills | Status: DC

## 2022-11-11 NOTE — Telephone Encounter (Signed)
Prescription Request  11/11/2022  LOV: 10/21/2022  What is the name of the medication or equipment? Atorvastatin '40mg'$   Have you contacted your pharmacy to request a refill? No   Which pharmacy would you like this sent to?  [facility] [phone] - [place], [place] - [place] [address] [place] [address] Phone: [phone] Fax: [phone]  Patient notified that their request is being sent to the clinical staff for review and that they should receive a response within 2 business days.   Please advise at [place]

## 2022-11-11 NOTE — Telephone Encounter (Signed)
Refill sent to pharmacy.   

## 2022-11-12 NOTE — Telephone Encounter (Signed)
Patient states previous paperwork was not the right one, he has dropped off the correct form and it will be in providers box at the front.

## 2022-11-16 NOTE — Telephone Encounter (Signed)
Form received, completion in progress 

## 2022-12-03 ENCOUNTER — Ambulatory Visit: Payer: Medicare Other | Admitting: Podiatry

## 2022-12-10 ENCOUNTER — Encounter: Payer: Self-pay | Admitting: Podiatry

## 2022-12-10 ENCOUNTER — Ambulatory Visit: Payer: Medicare Other | Admitting: Podiatry

## 2022-12-10 VITALS — BP 169/69 | HR 78

## 2022-12-10 DIAGNOSIS — R7309 Other abnormal glucose: Secondary | ICD-10-CM

## 2022-12-10 DIAGNOSIS — M79675 Pain in left toe(s): Secondary | ICD-10-CM

## 2022-12-10 DIAGNOSIS — M79674 Pain in right toe(s): Secondary | ICD-10-CM

## 2022-12-10 DIAGNOSIS — B351 Tinea unguium: Secondary | ICD-10-CM | POA: Diagnosis not present

## 2022-12-10 NOTE — Progress Notes (Signed)
This patient presents  to my office for at risk foot care.  This patient requires this care by a professional since this patient will be at risk due to having diabetes and CKD.  This patient is unable to cut nails himself since the patient cannot reach his nails.These nails are painful walking and wearing shoes.  This patient presents for at risk foot care today.  General Appearance  Alert, conversant and in no acute stress.  Vascular  Dorsalis pedis and posterior tibial  pulses are weakly palpable  bilaterally.  Capillary return is within normal limits  bilaterally. Temperature is within normal limits  bilaterally.  Neurologic  Senn-Weinstein monofilament wire test within normal limits  bilaterally. Muscle power within normal limits bilaterally.  Nails Thick disfigured discolored nails with subungual debris  from hallux to fifth toes bilaterally. No evidence of bacterial infection or drainage bilaterally.  Orthopedic  No limitations of motion  feet .  No crepitus or effusions noted.  No bony pathology or digital deformities noted.  Skin  normotropic skin with no porokeratosis noted bilaterally.  No signs of infections or ulcers noted.     Onychomycosis  Pain in right toes  Pain in left toes  Consent was obtained for treatment procedures.   Mechanical debridement of nails 1-5  bilaterally performed with a nail nipper.  Filed with dremel without incident.    Return office visit   3 months                   Told patient to return for periodic foot care and evaluation due to potential at risk complications.   Ezmae Speers DPM   

## 2023-02-01 ENCOUNTER — Telehealth: Payer: Self-pay | Admitting: Internal Medicine

## 2023-02-01 NOTE — Telephone Encounter (Signed)
All should be ok with BP med and sugar as long as he feels ok, without dizziness, weakness or falls

## 2023-02-01 NOTE — Telephone Encounter (Signed)
Pt wanted to know if he need to keep taking 1 pill twice a day for his BP. Pt stated his BP reading which is 148/71, 128/67, and 107/60. Pt also stated that his blood sugar readings is 101 and 117 and that's the readings from this morning. Please advise.

## 2023-02-02 NOTE — Telephone Encounter (Signed)
Called and left voice mail

## 2023-02-04 ENCOUNTER — Telehealth: Payer: Self-pay | Admitting: Cardiovascular Disease

## 2023-02-04 MED ORDER — AMLODIPINE BESYLATE 5 MG PO TABS: 5.0000 mg | ORAL_TABLET | Freq: Every day | ORAL | 1 refills | Status: DC

## 2023-02-04 NOTE — Telephone Encounter (Signed)
Left message for patient to call back  

## 2023-02-04 NOTE — Telephone Encounter (Signed)
Pt c/o BP issue: STAT if pt c/o blurred vision, one-sided weakness or slurred speech  1. What are your last 5 BP readings? 100/60; 113/61  2. Are you having any other symptoms (ex. Dizziness, headache, blurred vision, passed out)? Dizziness   3. What is your BP issue? Calling to see if he should continue taking medication

## 2023-02-04 NOTE — Telephone Encounter (Signed)
Called patient back about message. Patient is concerned about his BP being too low. Patient complaining of dizziness as well. Patient's BP 100/60 this morning and gradually getting low through out the week. Patient's HR 70's and 80's. Patient is currently on amlodipine 10 mg and coreg 6.25 mg BID. Consulted Dr. Mayford Knife, DOD, she advised patient to reduce his amlodipine to 5 mg for now and monitor his BPs. Patient agreed to plan. Patient will call back if BP trends down or becomes high.

## 2023-02-09 DIAGNOSIS — H905 Unspecified sensorineural hearing loss: Secondary | ICD-10-CM | POA: Diagnosis not present

## 2023-02-18 ENCOUNTER — Telehealth: Payer: Self-pay | Admitting: Internal Medicine

## 2023-02-18 NOTE — Telephone Encounter (Signed)
Prescription Request  02/18/2023  LOV: 10/21/2022  What is the name of the medication or equipment? levothyroxine (SYNTHROID) 50 MCG tablet  Have you contacted your pharmacy to request a refill? No   Which pharmacy would you like this sent to?   [facility] [phone] - [place], [place] - [address] AT [facility] [address] [place] [address] Phone: [phone] Fax: [phone]     Patient notified that their request is being sent to the clinical staff for review and that they should receive a response within 2 business days.   Please advise at [facility] [phone]

## 2023-02-18 NOTE — Telephone Encounter (Signed)
Not due for refill

## 2023-02-21 ENCOUNTER — Other Ambulatory Visit: Payer: Self-pay

## 2023-02-21 ENCOUNTER — Other Ambulatory Visit: Payer: Self-pay | Admitting: Internal Medicine

## 2023-02-21 MED ORDER — AMLODIPINE BESYLATE 5 MG PO TABS: 5.0000 mg | ORAL_TABLET | Freq: Every day | ORAL | 2 refills | Status: DC

## 2023-02-22 ENCOUNTER — Encounter: Payer: Self-pay | Admitting: Internal Medicine

## 2023-02-22 ENCOUNTER — Other Ambulatory Visit: Payer: Self-pay | Admitting: *Deleted

## 2023-02-22 ENCOUNTER — Ambulatory Visit (INDEPENDENT_AMBULATORY_CARE_PROVIDER_SITE_OTHER): Payer: Medicare Other | Admitting: Internal Medicine

## 2023-02-22 VITALS — BP 138/82 | HR 68 | Temp 98.4°F | Ht 70.0 in | Wt 172.0 lb

## 2023-02-22 DIAGNOSIS — E7800 Pure hypercholesterolemia, unspecified: Secondary | ICD-10-CM

## 2023-02-22 DIAGNOSIS — E559 Vitamin D deficiency, unspecified: Secondary | ICD-10-CM | POA: Diagnosis not present

## 2023-02-22 DIAGNOSIS — R519 Headache, unspecified: Secondary | ICD-10-CM | POA: Diagnosis not present

## 2023-02-22 DIAGNOSIS — E538 Deficiency of other specified B group vitamins: Secondary | ICD-10-CM

## 2023-02-22 DIAGNOSIS — F411 Generalized anxiety disorder: Secondary | ICD-10-CM

## 2023-02-22 DIAGNOSIS — R7309 Other abnormal glucose: Secondary | ICD-10-CM | POA: Diagnosis not present

## 2023-02-22 DIAGNOSIS — N184 Chronic kidney disease, stage 4 (severe): Secondary | ICD-10-CM | POA: Diagnosis not present

## 2023-02-22 DIAGNOSIS — I10 Essential (primary) hypertension: Secondary | ICD-10-CM | POA: Diagnosis not present

## 2023-02-22 LAB — MICROALBUMIN / CREATININE URINE RATIO
Creatinine,U: 101.1 mg/dL
Microalb Creat Ratio: 0.8 mg/g (ref 0.0–30.0)
Microalb, Ur: 0.8 mg/dL (ref 0.0–1.9)

## 2023-02-22 LAB — VITAMIN B12: Vitamin B-12: 482 pg/mL (ref 211–911)

## 2023-02-22 LAB — CBC WITH DIFFERENTIAL/PLATELET
Basophils Absolute: 0 10*3/uL (ref 0.0–0.1)
Basophils Relative: 0.9 % (ref 0.0–3.0)
Eosinophils Absolute: 0.1 10*3/uL (ref 0.0–0.7)
Eosinophils Relative: 3.4 % (ref 0.0–5.0)
HCT: 32.5 % — ABNORMAL LOW (ref 39.0–52.0)
Hemoglobin: 10.8 g/dL — ABNORMAL LOW (ref 13.0–17.0)
Lymphocytes Relative: 34.2 % (ref 12.0–46.0)
Lymphs Abs: 1.2 10*3/uL (ref 0.7–4.0)
MCHC: 33.2 g/dL (ref 30.0–36.0)
MCV: 97 fl (ref 78.0–100.0)
Monocytes Absolute: 0.6 10*3/uL (ref 0.1–1.0)
Monocytes Relative: 16.6 % — ABNORMAL HIGH (ref 3.0–12.0)
Neutro Abs: 1.5 10*3/uL (ref 1.4–7.7)
Neutrophils Relative %: 44.9 % (ref 43.0–77.0)
Platelets: 146 10*3/uL — ABNORMAL LOW (ref 150.0–400.0)
RBC: 3.35 Mil/uL — ABNORMAL LOW (ref 4.22–5.81)
RDW: 14.3 % (ref 11.5–15.5)
WBC: 3.4 10*3/uL — ABNORMAL LOW (ref 4.0–10.5)

## 2023-02-22 LAB — URINALYSIS, ROUTINE W REFLEX MICROSCOPIC
Bilirubin Urine: NEGATIVE
Hgb urine dipstick: NEGATIVE
Ketones, ur: NEGATIVE
Leukocytes,Ua: NEGATIVE
Nitrite: NEGATIVE
RBC / HPF: NONE SEEN (ref 0–?)
Specific Gravity, Urine: 1.015 (ref 1.000–1.030)
Total Protein, Urine: NEGATIVE
Urine Glucose: NEGATIVE
Urobilinogen, UA: 0.2 (ref 0.0–1.0)
pH: 6 (ref 5.0–8.0)

## 2023-02-22 LAB — LIPID PANEL
Cholesterol: 133 mg/dL (ref 0–200)
HDL: 55.5 mg/dL (ref >39.00)
LDL Cholesterol: 61 mg/dL (ref 0–99)
NonHDL: 77.31
Total CHOL/HDL Ratio: 2
Triglycerides: 84 mg/dL (ref 0.0–149.0)
VLDL: 16.8 mg/dL (ref 0.0–40.0)

## 2023-02-22 LAB — TSH: TSH: 2.81 u[IU]/mL (ref 0.35–5.50)

## 2023-02-22 LAB — HEPATIC FUNCTION PANEL
ALT: 16 U/L (ref 0–53)
AST: 21 U/L (ref 0–37)
Albumin: 3.9 g/dL (ref 3.5–5.2)
Alkaline Phosphatase: 57 U/L (ref 39–117)
Bilirubin, Direct: 0.1 mg/dL (ref 0.0–0.3)
Total Bilirubin: 0.4 mg/dL (ref 0.2–1.2)
Total Protein: 6.8 g/dL (ref 6.0–8.3)

## 2023-02-22 LAB — BASIC METABOLIC PANEL
BUN: 25 mg/dL — ABNORMAL HIGH (ref 6–23)
CO2: 26 mEq/L (ref 19–32)
Calcium: 9.3 mg/dL (ref 8.4–10.5)
Chloride: 107 mEq/L (ref 96–112)
Creatinine, Ser: 2.19 mg/dL — ABNORMAL HIGH (ref 0.40–1.50)
GFR: 26.37 mL/min — ABNORMAL LOW (ref >60.00)
Glucose, Bld: 124 mg/dL — ABNORMAL HIGH (ref 70–99)
Potassium: 4.9 mEq/L (ref 3.5–5.1)
Sodium: 139 mEq/L (ref 135–145)

## 2023-02-22 LAB — HEMOGLOBIN A1C: Hgb A1c MFr Bld: 6.3 % (ref 4.6–6.5)

## 2023-02-22 LAB — VITAMIN D 25 HYDROXY (VIT D DEFICIENCY, FRACTURES): VITD: 52.51 ng/mL (ref 30.00–100.00)

## 2023-02-22 MED ORDER — CARVEDILOL 6.25 MG PO TABS: 6.2500 mg | ORAL_TABLET | Freq: Two times a day (BID) | ORAL | 3 refills | Status: DC

## 2023-02-22 MED ORDER — LEVOTHYROXINE SODIUM 50 MCG PO TABS: 50.0000 ug | ORAL_TABLET | Freq: Every day | ORAL | 3 refills | Status: DC
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
## 2023-02-22 NOTE — Patient Instructions (Signed)
Ok to use tylenol as needed for the scalp tenderness  Please continue all other medications as before, and refills have been done for the thyroid medication  Please have the pharmacy call with any other refills you may need.  Please continue your efforts at being more active, low cholesterol diet, and weight control.  Please keep your appointments with your specialists as you may have planned  Please go to the LAB at the blood drawing area for the tests to be done  You will be contacted by phone if any changes need to be made immediately.  Otherwise, you will receive a letter about your results with an explanation, but please check with MyChart first.  Please remember to sign up for MyChart if you have not done so, as this will be important to you in the future with finding out test results, communicating by private email, and scheduling acute appointments online when needed.  Please make an Appointment to return in 6 months, or sooner if needed

## 2023-02-22 NOTE — Progress Notes (Signed)
The test results show that your current treatment is OK, as the tests are stable.  Please continue the same plan.  There is no other need for change of treatment or further evaluation based on these results, at this time.  thanks 

## 2023-02-26 ENCOUNTER — Encounter: Payer: Self-pay | Admitting: Internal Medicine

## 2023-02-26 DIAGNOSIS — R519 Headache, unspecified: Secondary | ICD-10-CM | POA: Insufficient documentation

## 2023-02-26 NOTE — Assessment & Plan Note (Signed)
With mild situational worsening, pt declines need for counseling referral or further med tx at this time, pt reassured

## 2023-02-26 NOTE — Assessment & Plan Note (Signed)
Lab Results  Component Value Date   LDLCALC 61 02/22/2023   Stable, pt to continue current statin lipitor 40 qd

## 2023-02-26 NOTE — Assessment & Plan Note (Signed)
Lab Results  Component Value Date   CREATININE 2.19 (H) 02/22/2023   Stable overall, cont to avoid nephrotoxins

## 2023-02-26 NOTE — Assessment & Plan Note (Signed)
Lab Results  Component Value Date   HGBA1C 6.3 02/22/2023   Stable, pt to continue current medical treatment  - diet, wt control

## 2023-02-26 NOTE — Assessment & Plan Note (Signed)
Pt has area of tenderness without other significant abnormality, d/w pt this is not likely stroke and can be followed with expectant management,  to f/u any worsening symptoms or concerns, for tylenol prn

## 2023-02-26 NOTE — Assessment & Plan Note (Signed)
Last vitamin D Lab Results  Component Value Date   VD25OH 52.51 02/22/2023   Stable, cont oral replacement

## 2023-02-26 NOTE — Assessment & Plan Note (Signed)
BP Readings from Last 3 Encounters:  02/22/23 138/82  12/10/22 (!) 169/69  11/10/22 138/66   Stable, pt to continue medical treatment amlodipine 5 every day, coreg 6.25 bid

## 2023-03-07 ENCOUNTER — Telehealth: Payer: Self-pay | Admitting: Internal Medicine

## 2023-03-07 NOTE — Telephone Encounter (Signed)
  Prescription Request  03/07/2023  LOV: 02/22/2023  What is the name of the medication or equipment? atorvastatin  Have you contacted your pharmacy to request a refill? Yes   Which pharmacy would you like this sent to?  [facility] - [place], [place] - [address] [place] [address] Phone: [phone] Fax: [phone]    Patient notified that their request is being sent to the clinical staff for review and that they should receive a response within 2 business days.   Please advise at Mobile [phone] (mobile)

## 2023-03-07 NOTE — Telephone Encounter (Signed)
Not due for refill

## 2023-03-11 ENCOUNTER — Encounter: Payer: Self-pay | Admitting: Podiatry

## 2023-03-11 ENCOUNTER — Ambulatory Visit: Payer: Medicare Other | Admitting: Podiatry

## 2023-03-11 DIAGNOSIS — M79674 Pain in right toe(s): Secondary | ICD-10-CM | POA: Diagnosis not present

## 2023-03-11 DIAGNOSIS — R7309 Other abnormal glucose: Secondary | ICD-10-CM

## 2023-03-11 DIAGNOSIS — B351 Tinea unguium: Secondary | ICD-10-CM | POA: Diagnosis not present

## 2023-03-11 DIAGNOSIS — M79675 Pain in left toe(s): Secondary | ICD-10-CM | POA: Diagnosis not present

## 2023-03-11 NOTE — Progress Notes (Signed)
This patient presents  to my office for at risk foot care.  This patient requires this care by a professional since this patient will be at risk due to having diabetes and CKD.  This patient is unable to cut nails himself since the patient cannot reach his nails.These nails are painful walking and wearing shoes.  This patient presents for at risk foot care today.  General Appearance  Alert, conversant and in no acute stress.  Vascular  Dorsalis pedis and posterior tibial  pulses are weakly palpable  bilaterally.  Capillary return is within normal limits  bilaterally. Temperature is within normal limits  bilaterally.  Neurologic  Senn-Weinstein monofilament wire test within normal limits  bilaterally. Muscle power within normal limits bilaterally.  Nails Thick disfigured discolored nails with subungual debris  from hallux to fifth toes bilaterally. No evidence of bacterial infection or drainage bilaterally.  Orthopedic  No limitations of motion  feet .  No crepitus or effusions noted.  No bony pathology or digital deformities noted.  Skin  normotropic skin with no porokeratosis noted bilaterally.  No signs of infections or ulcers noted.     Onychomycosis  Pain in right toes  Pain in left toes  Consent was obtained for treatment procedures.   Mechanical debridement of nails 1-5  bilaterally performed with a nail nipper.  Filed with dremel without incident.    Return office visit   3 months                   Told patient to return for periodic foot care and evaluation due to potential at risk complications.   Brylin Stanislawski DPM   

## 2023-03-12 ENCOUNTER — Emergency Department (HOSPITAL_COMMUNITY): Payer: Medicare Other

## 2023-03-12 ENCOUNTER — Other Ambulatory Visit: Payer: Self-pay

## 2023-03-12 ENCOUNTER — Inpatient Hospital Stay (HOSPITAL_COMMUNITY)
Admission: EM | Admit: 2023-03-12 | Discharge: 2023-03-14 | DRG: 071 | Disposition: A | Discharge status: Home / Self Care | Payer: Medicare Other | Attending: Internal Medicine | Admitting: Internal Medicine

## 2023-03-12 ENCOUNTER — Encounter (HOSPITAL_COMMUNITY): Payer: Self-pay

## 2023-03-12 DIAGNOSIS — R0982 Postnasal drip: Secondary | ICD-10-CM | POA: Diagnosis not present

## 2023-03-12 DIAGNOSIS — I10 Essential (primary) hypertension: Secondary | ICD-10-CM

## 2023-03-12 DIAGNOSIS — K219 Gastro-esophageal reflux disease without esophagitis: Secondary | ICD-10-CM | POA: Diagnosis not present

## 2023-03-12 DIAGNOSIS — N400 Enlarged prostate without lower urinary tract symptoms: Secondary | ICD-10-CM | POA: Diagnosis present

## 2023-03-12 DIAGNOSIS — E039 Hypothyroidism, unspecified: Secondary | ICD-10-CM | POA: Diagnosis present

## 2023-03-12 DIAGNOSIS — R4182 Altered mental status, unspecified: Secondary | ICD-10-CM | POA: Diagnosis not present

## 2023-03-12 DIAGNOSIS — E7800 Pure hypercholesterolemia, unspecified: Secondary | ICD-10-CM | POA: Diagnosis present

## 2023-03-12 DIAGNOSIS — Z1152 Encounter for screening for COVID-19: Secondary | ICD-10-CM

## 2023-03-12 DIAGNOSIS — Z841 Family history of disorders of kidney and ureter: Secondary | ICD-10-CM

## 2023-03-12 DIAGNOSIS — Z79899 Other long term (current) drug therapy: Secondary | ICD-10-CM | POA: Diagnosis not present

## 2023-03-12 DIAGNOSIS — Z7982 Long term (current) use of aspirin: Secondary | ICD-10-CM | POA: Diagnosis not present

## 2023-03-12 DIAGNOSIS — Z87891 Personal history of nicotine dependence: Secondary | ICD-10-CM | POA: Diagnosis not present

## 2023-03-12 DIAGNOSIS — E1122 Type 2 diabetes mellitus with diabetic chronic kidney disease: Secondary | ICD-10-CM | POA: Diagnosis not present

## 2023-03-12 DIAGNOSIS — F419 Anxiety disorder, unspecified: Secondary | ICD-10-CM | POA: Diagnosis present

## 2023-03-12 DIAGNOSIS — I771 Stricture of artery: Secondary | ICD-10-CM | POA: Diagnosis not present

## 2023-03-12 DIAGNOSIS — Z79890 Hormone replacement therapy (postmenopausal): Secondary | ICD-10-CM

## 2023-03-12 DIAGNOSIS — Z8673 Personal history of transient ischemic attack (TIA), and cerebral infarction without residual deficits: Secondary | ICD-10-CM

## 2023-03-12 DIAGNOSIS — E119 Type 2 diabetes mellitus without complications: Secondary | ICD-10-CM

## 2023-03-12 DIAGNOSIS — E1169 Type 2 diabetes mellitus with other specified complication: Secondary | ICD-10-CM | POA: Diagnosis not present

## 2023-03-12 DIAGNOSIS — R059 Cough, unspecified: Secondary | ICD-10-CM | POA: Diagnosis present

## 2023-03-12 DIAGNOSIS — Z86010 Personal history of colonic polyps: Secondary | ICD-10-CM

## 2023-03-12 DIAGNOSIS — G9341 Metabolic encephalopathy: Principal | ICD-10-CM | POA: Diagnosis present

## 2023-03-12 DIAGNOSIS — N184 Chronic kidney disease, stage 4 (severe): Secondary | ICD-10-CM

## 2023-03-12 DIAGNOSIS — Z888 Allergy status to other drugs, medicaments and biological substances status: Secondary | ICD-10-CM | POA: Diagnosis not present

## 2023-03-12 DIAGNOSIS — Z96652 Presence of left artificial knee joint: Secondary | ICD-10-CM | POA: Diagnosis not present

## 2023-03-12 DIAGNOSIS — R569 Unspecified convulsions: Secondary | ICD-10-CM | POA: Diagnosis not present

## 2023-03-12 DIAGNOSIS — H5461 Unqualified visual loss, right eye, normal vision left eye: Secondary | ICD-10-CM | POA: Diagnosis present

## 2023-03-12 DIAGNOSIS — I129 Hypertensive chronic kidney disease with stage 1 through stage 4 chronic kidney disease, or unspecified chronic kidney disease: Secondary | ICD-10-CM | POA: Diagnosis present

## 2023-03-12 DIAGNOSIS — G9389 Other specified disorders of brain: Secondary | ICD-10-CM | POA: Diagnosis not present

## 2023-03-12 DIAGNOSIS — I700 Atherosclerosis of aorta: Secondary | ICD-10-CM | POA: Diagnosis not present

## 2023-03-12 DIAGNOSIS — R9082 White matter disease, unspecified: Secondary | ICD-10-CM | POA: Diagnosis not present

## 2023-03-12 LAB — COMPREHENSIVE METABOLIC PANEL
ALT: 20 U/L (ref 0–44)
AST: 26 U/L (ref 15–41)
Albumin: 3.7 g/dL (ref 3.5–5.0)
Alkaline Phosphatase: 53 U/L (ref 38–126)
Anion gap: 7 (ref 5–15)
BUN: 26 mg/dL — ABNORMAL HIGH (ref 8–23)
CO2: 20 mmol/L — ABNORMAL LOW (ref 22–32)
Calcium: 8.6 mg/dL — ABNORMAL LOW (ref 8.9–10.3)
Chloride: 108 mmol/L (ref 98–111)
Creatinine, Ser: 2.36 mg/dL — ABNORMAL HIGH (ref 0.61–1.24)
GFR, Estimated: 26 mL/min — ABNORMAL LOW (ref >60)
Glucose, Bld: 116 mg/dL — ABNORMAL HIGH (ref 70–99)
Potassium: 4.2 mmol/L (ref 3.5–5.1)
Sodium: 135 mmol/L (ref 135–145)
Total Bilirubin: 0.9 mg/dL (ref 0.3–1.2)
Total Protein: 6.9 g/dL (ref 6.5–8.1)

## 2023-03-12 LAB — CBC WITH DIFFERENTIAL/PLATELET
Abs Immature Granulocytes: 0.01 10*3/uL (ref 0.00–0.07)
Basophils Absolute: 0 10*3/uL (ref 0.0–0.1)
Basophils Relative: 1 %
Eosinophils Absolute: 0.1 10*3/uL (ref 0.0–0.5)
Eosinophils Relative: 3 %
HCT: 31.7 % — ABNORMAL LOW (ref 39.0–52.0)
Hemoglobin: 10.2 g/dL — ABNORMAL LOW (ref 13.0–17.0)
Immature Granulocytes: 0 %
Lymphocytes Relative: 39 %
Lymphs Abs: 1.4 10*3/uL (ref 0.7–4.0)
MCH: 32 pg (ref 26.0–34.0)
MCHC: 32.2 g/dL (ref 30.0–36.0)
MCV: 99.4 fL (ref 80.0–100.0)
Monocytes Absolute: 0.4 10*3/uL (ref 0.1–1.0)
Monocytes Relative: 11 %
Neutro Abs: 1.6 10*3/uL — ABNORMAL LOW (ref 1.7–7.7)
Neutrophils Relative %: 46 %
Platelets: 146 10*3/uL — ABNORMAL LOW (ref 150–400)
RBC: 3.19 MIL/uL — ABNORMAL LOW (ref 4.22–5.81)
RDW: 13.1 % (ref 11.5–15.5)
WBC: 3.6 10*3/uL — ABNORMAL LOW (ref 4.0–10.5)
nRBC: 0 % (ref 0.0–0.2)

## 2023-03-12 LAB — URINALYSIS, ROUTINE W REFLEX MICROSCOPIC
Bilirubin Urine: NEGATIVE
Glucose, UA: NEGATIVE mg/dL
Hgb urine dipstick: NEGATIVE
Ketones, ur: NEGATIVE mg/dL
Leukocytes,Ua: NEGATIVE
Nitrite: NEGATIVE
Protein, ur: NEGATIVE mg/dL
Specific Gravity, Urine: 1.009 (ref 1.005–1.030)
pH: 7 (ref 5.0–8.0)

## 2023-03-12 LAB — CBG MONITORING, ED
Glucose-Capillary: 108 mg/dL — ABNORMAL HIGH (ref 70–99)
Glucose-Capillary: 99 mg/dL (ref 70–99)

## 2023-03-12 LAB — ETHANOL: Alcohol, Ethyl (B): <10 mg/dL (ref <10)

## 2023-03-12 LAB — TSH: TSH: 2.065 u[IU]/mL (ref 0.350–4.500)

## 2023-03-12 LAB — AMMONIA: Ammonia: 30 umol/L (ref 9–35)

## 2023-03-12 MED ORDER — PANTOPRAZOLE SODIUM 40 MG PO TBEC
40.0000 mg | DELAYED_RELEASE_TABLET | Freq: Every day | ORAL | Status: DC
  Administered 2023-03-13 – 2023-03-14 (×2): 40 mg via ORAL
  Filled 2023-03-12 (×2): qty 1

## 2023-03-12 MED ORDER — TAMSULOSIN HCL 0.4 MG PO CAPS
0.4000 mg | ORAL_CAPSULE | Freq: Every day | ORAL | Status: DC
  Administered 2023-03-13 – 2023-03-14 (×2): 0.4 mg via ORAL
  Filled 2023-03-12 (×2): qty 1

## 2023-03-12 MED ORDER — INSULIN ASPART 100 UNIT/ML IJ SOLN
0.0000 [IU] | Freq: Every day | INTRAMUSCULAR | Status: DC
  Filled 2023-03-12: qty 0.05

## 2023-03-12 MED ORDER — ASPIRIN 81 MG PO TBEC
81.0000 mg | DELAYED_RELEASE_TABLET | Freq: Every day | ORAL | Status: DC
  Administered 2023-03-13 – 2023-03-14 (×2): 81 mg via ORAL
  Filled 2023-03-12 (×2): qty 1

## 2023-03-12 MED ORDER — ACETAMINOPHEN 325 MG PO TABS
650.0000 mg | ORAL_TABLET | Freq: Four times a day (QID) | ORAL | Status: DC | PRN
  Filled 2023-03-12: qty 2

## 2023-03-12 MED ORDER — ATORVASTATIN CALCIUM 40 MG PO TABS
40.0000 mg | ORAL_TABLET | Freq: Every day | ORAL | Status: DC
  Administered 2023-03-13 – 2023-03-14 (×2): 40 mg via ORAL
  Filled 2023-03-12 (×2): qty 1

## 2023-03-12 MED ORDER — INSULIN ASPART 100 UNIT/ML IJ SOLN
0.0000 [IU] | Freq: Three times a day (TID) | INTRAMUSCULAR | Status: DC
  Administered 2023-03-14: 1 [IU] via SUBCUTANEOUS
  Filled 2023-03-12: qty 0.09

## 2023-03-12 MED ORDER — HEPARIN SODIUM (PORCINE) 5000 UNIT/ML IJ SOLN
5000.0000 [IU] | Freq: Three times a day (TID) | INTRAMUSCULAR | Status: DC
  Administered 2023-03-12 – 2023-03-14 (×6): 5000 [IU] via SUBCUTANEOUS
  Filled 2023-03-12 (×6): qty 1

## 2023-03-12 MED ORDER — ACETAMINOPHEN 650 MG RE SUPP: 650.0000 mg | Freq: Four times a day (QID) | RECTAL | Status: DC | PRN

## 2023-03-12 MED ORDER — LEVOTHYROXINE SODIUM 50 MCG PO TABS
50.0000 ug | ORAL_TABLET | Freq: Every day | ORAL | Status: DC
  Administered 2023-03-13 – 2023-03-14 (×2): 50 ug via ORAL
  Filled 2023-03-12 (×2): qty 1
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
## 2023-03-12 NOTE — ED Notes (Signed)
Dr Davis at bedside.

## 2023-03-12 NOTE — Plan of Care (Signed)
ON CALL NEUROLOGIST NOTE  Called by hospitalist at [facility] for pt with AMS. CT, labs reassuring. Recommend EEG and MRI - both ofwhich not available at this time at [facility]. Recommend [facility] tele bed, MRI, EEG and consult if abnormal or if still no cause identified.  -- Milon Dikes, MD Neurologist [facility] Pager: [phone]

## 2023-03-12 NOTE — ED Notes (Signed)
Patient transported to X-ray 

## 2023-03-12 NOTE — ED Triage Notes (Addendum)
Patient presents with son, son said yesterday at 5pm patient was last acting normal. Today he said when he went to patients house, patient was unable to recognize his son. Acting completely off. Patient alert to self and place only. Usually alert and oriented x4.
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
## 2023-03-12 NOTE — ED Notes (Signed)
Pt made aware of UA sample. Pt unable to void att. Urinal at bedside

## 2023-03-13 ENCOUNTER — Observation Stay (HOSPITAL_COMMUNITY): Payer: Medicare Other

## 2023-03-13 DIAGNOSIS — Z8673 Personal history of transient ischemic attack (TIA), and cerebral infarction without residual deficits: Secondary | ICD-10-CM | POA: Diagnosis not present

## 2023-03-13 DIAGNOSIS — R4182 Altered mental status, unspecified: Secondary | ICD-10-CM

## 2023-03-13 DIAGNOSIS — G9341 Metabolic encephalopathy: Secondary | ICD-10-CM | POA: Diagnosis present

## 2023-03-13 DIAGNOSIS — R059 Cough, unspecified: Secondary | ICD-10-CM | POA: Diagnosis present

## 2023-03-13 DIAGNOSIS — Z1152 Encounter for screening for COVID-19: Secondary | ICD-10-CM | POA: Diagnosis not present

## 2023-03-13 DIAGNOSIS — Z96652 Presence of left artificial knee joint: Secondary | ICD-10-CM | POA: Diagnosis present

## 2023-03-13 DIAGNOSIS — I129 Hypertensive chronic kidney disease with stage 1 through stage 4 chronic kidney disease, or unspecified chronic kidney disease: Secondary | ICD-10-CM | POA: Diagnosis present

## 2023-03-13 DIAGNOSIS — E1122 Type 2 diabetes mellitus with diabetic chronic kidney disease: Secondary | ICD-10-CM | POA: Diagnosis present

## 2023-03-13 DIAGNOSIS — Z79890 Hormone replacement therapy (postmenopausal): Secondary | ICD-10-CM | POA: Diagnosis not present

## 2023-03-13 DIAGNOSIS — R0982 Postnasal drip: Secondary | ICD-10-CM | POA: Diagnosis present

## 2023-03-13 DIAGNOSIS — F419 Anxiety disorder, unspecified: Secondary | ICD-10-CM | POA: Diagnosis present

## 2023-03-13 DIAGNOSIS — Z86010 Personal history of colonic polyps: Secondary | ICD-10-CM | POA: Diagnosis not present

## 2023-03-13 DIAGNOSIS — R9082 White matter disease, unspecified: Secondary | ICD-10-CM | POA: Diagnosis not present

## 2023-03-13 DIAGNOSIS — R569 Unspecified convulsions: Secondary | ICD-10-CM | POA: Diagnosis not present

## 2023-03-13 DIAGNOSIS — E7800 Pure hypercholesterolemia, unspecified: Secondary | ICD-10-CM | POA: Diagnosis present

## 2023-03-13 DIAGNOSIS — I10 Essential (primary) hypertension: Secondary | ICD-10-CM | POA: Diagnosis not present

## 2023-03-13 DIAGNOSIS — Z888 Allergy status to other drugs, medicaments and biological substances status: Secondary | ICD-10-CM | POA: Diagnosis not present

## 2023-03-13 DIAGNOSIS — Z87891 Personal history of nicotine dependence: Secondary | ICD-10-CM | POA: Diagnosis not present

## 2023-03-13 DIAGNOSIS — K219 Gastro-esophageal reflux disease without esophagitis: Secondary | ICD-10-CM | POA: Diagnosis present

## 2023-03-13 DIAGNOSIS — H5461 Unqualified visual loss, right eye, normal vision left eye: Secondary | ICD-10-CM | POA: Diagnosis present

## 2023-03-13 DIAGNOSIS — N400 Enlarged prostate without lower urinary tract symptoms: Secondary | ICD-10-CM | POA: Diagnosis present

## 2023-03-13 DIAGNOSIS — Z7982 Long term (current) use of aspirin: Secondary | ICD-10-CM | POA: Diagnosis not present

## 2023-03-13 DIAGNOSIS — G9389 Other specified disorders of brain: Secondary | ICD-10-CM | POA: Diagnosis not present

## 2023-03-13 DIAGNOSIS — Z79899 Other long term (current) drug therapy: Secondary | ICD-10-CM | POA: Diagnosis not present

## 2023-03-13 DIAGNOSIS — Z841 Family history of disorders of kidney and ureter: Secondary | ICD-10-CM | POA: Diagnosis not present

## 2023-03-13 DIAGNOSIS — N184 Chronic kidney disease, stage 4 (severe): Secondary | ICD-10-CM | POA: Diagnosis present

## 2023-03-13 DIAGNOSIS — E039 Hypothyroidism, unspecified: Secondary | ICD-10-CM | POA: Diagnosis present

## 2023-03-13 LAB — CBC
HCT: 32.8 % — ABNORMAL LOW (ref 39.0–52.0)
Hemoglobin: 10.5 g/dL — ABNORMAL LOW (ref 13.0–17.0)
MCH: 31.3 pg (ref 26.0–34.0)
MCHC: 32 g/dL (ref 30.0–36.0)
MCV: 97.9 fL (ref 80.0–100.0)
Platelets: 143 10*3/uL — ABNORMAL LOW (ref 150–400)
RBC: 3.35 MIL/uL — ABNORMAL LOW (ref 4.22–5.81)
RDW: 13 % (ref 11.5–15.5)
WBC: 3.6 10*3/uL — ABNORMAL LOW (ref 4.0–10.5)
nRBC: 0 % (ref 0.0–0.2)

## 2023-03-13 LAB — CBG MONITORING, ED
Glucose-Capillary: 98 mg/dL (ref 70–99)
Glucose-Capillary: 111 mg/dL — ABNORMAL HIGH (ref 70–99)
Glucose-Capillary: 111 mg/dL — ABNORMAL HIGH (ref 70–99)
Glucose-Capillary: 88 mg/dL (ref 70–99)

## 2023-03-13 MED ORDER — LORAZEPAM 2 MG/ML IJ SOLN
2.0000 mg | INTRAMUSCULAR | Status: DC | PRN
  Administered 2023-03-13: 2 mg via INTRAVENOUS
  Filled 2023-03-13: qty 1

## 2023-03-13 NOTE — Progress Notes (Signed)
Patient received from [facility] ED, alert and oriented X4, tele monitor attached, bed in lowest position, bed alarm on, call bell within reach, will continue to monitor
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
## 2023-03-14 ENCOUNTER — Inpatient Hospital Stay (HOSPITAL_COMMUNITY): Payer: Medicare Other

## 2023-03-14 DIAGNOSIS — R4182 Altered mental status, unspecified: Secondary | ICD-10-CM | POA: Diagnosis not present

## 2023-03-14 DIAGNOSIS — I10 Essential (primary) hypertension: Secondary | ICD-10-CM | POA: Diagnosis not present

## 2023-03-14 DIAGNOSIS — E039 Hypothyroidism, unspecified: Secondary | ICD-10-CM

## 2023-03-14 DIAGNOSIS — N184 Chronic kidney disease, stage 4 (severe): Secondary | ICD-10-CM

## 2023-03-14 DIAGNOSIS — G9341 Metabolic encephalopathy: Secondary | ICD-10-CM | POA: Diagnosis not present

## 2023-03-14 DIAGNOSIS — E1169 Type 2 diabetes mellitus with other specified complication: Secondary | ICD-10-CM

## 2023-03-14 DIAGNOSIS — R569 Unspecified convulsions: Secondary | ICD-10-CM

## 2023-03-14 LAB — GLUCOSE, CAPILLARY
Glucose-Capillary: 91 mg/dL (ref 70–99)
Glucose-Capillary: 148 mg/dL — ABNORMAL HIGH (ref 70–99)

## 2023-03-14 MED ORDER — AMLODIPINE BESYLATE 5 MG PO TABS
5.0000 mg | ORAL_TABLET | Freq: Every day | ORAL | Status: DC
  Administered 2023-03-14: 5 mg via ORAL
  Filled 2023-03-14: qty 1

## 2023-03-14 MED ORDER — CARVEDILOL 6.25 MG PO TABS: 6.2500 mg | ORAL_TABLET | Freq: Every morning | ORAL | Status: DC

## 2023-03-14 MED ORDER — ORAL CARE MOUTH RINSE: 15.0000 mL | OROMUCOSAL | Status: DC | PRN

## 2023-03-14 NOTE — Discharge Summary (Signed)
Physician Discharge Summary   Patient name: Greg [name]  Admit date:     03/12/2023  Discharge date: 03/14/2023  Attending Physician: Marinda Elk [[id number]]  Discharge Physician: Carollee Herter   PCP: Corwin Levins, MD   Recommendations at discharge: F/U with PCP for EEG results  Discharge Diagnoses Principal Problem:   AMS (altered mental status) Active Problems:   Acute metabolic encephalopathy   HTN (hypertension)   GERD   Hypothyroidism   CKD (chronic kidney disease), stage IV (HCC)   Type 2 diabetes mellitus (HCC)   Resolved Diagnoses Resolved Problems:   * No resolved hospital problems. [facility] Course   HPI:  Greg [name] is a 89 y.o. male with medical history significant of hypertension, hyperlipidemia, type 2 diabetes, CKD stage IV, GERD, anxiety, glaucoma, right eye blindness, hypothyroidism, BPH presenting to the ED for evaluation of confusion, LKW at 5 or 6 PM yesterday.  Hypertensive with systolic in the 160s to 170s, remainder of vital signs stable.  Labs showing WBC 3.6 (slightly low on previous labs as well), hemoglobin 10.2 (at baseline), platelet count 146k, sodium 135, bicarb 20, anion gap 7, glucose 116, creatinine 2.3 (at baseline), ammonia level normal, blood ethanol level undetectable, TSH normal, UA not suggestive of infection.  Chest x-ray showing no acute abnormality.  CT head negative for acute intracranial abnormality.  ED physician discussed the case with on-call neurologist who recommended obtaining brain MRI to rule out stroke.   History provided by the patient and his son at bedside.  Son states he saw the patient yesterday at 5 PM and he was acting normal.  This morning patient went to church with a friend who told the patient's son that patient was acting normal the entire time he was at church.  He then returned home at around 1:30 PM and son noticed that patient was acting strange.  He appeared very confused and was still having difficulty  finding keys to open his front door.  He was walking around the house not knowing what he was doing and constantly touching objects.  Son brought him a pizza to eat which is his favorite food but he was not excited.  Son did not notice any weakness of arm or leg, facial droop, or slurred speech.  His confusion has now improved.  Patient denies history of seizures or previous stroke.  He reports chronic occasional cough which he attributes to postnasal drip from allergies.  Denies fevers, shortness of breath, chest pain, nausea, vomiting, abdominal pain, diarrhea, dysuria, or urinary frequency/urgency.  Significant Events: Admitted to [facility] on 03-12-2023  Significant Labs: BUN 26, Scr 2.36 TSH 2.065   Significant Imaging Studies: MRI brain negative for stroke. Did show "Subtle, punctate diffusion asymmetry in the right hippocampal formation. No hemorrhage or mass effect. Consider transient global amnesia (TGA)."  Also " Chronic cerebral white matter disease, most affecting the corpus callosum, not significantly changed since the 2020 MRI. No other acute intracranial abnormality" CT head negative for CVA EEG completed. Final report PENDING  Antibiotic Therapy:   Procedures:   Consultants:     Assessment and Plan: * AMS (altered mental status) Pt admitted to hospital. Initial CT head negative. Could not get MRI at [facility] hospital. Needed to be transferred to [facility] for MRI. MRI brain negative for CVA.  EEG performed but not yet read. Pt wanting to go home and does not want to stay another night in the hospital. Seen by PT  and able to ambulate without difficulty. Pt agrees to f/u with PCP regarding his EEG results. Given his resolved confusion, doubt he had a seizure. MRI brain showed "Subtle, punctate diffusion asymmetry in the right hippocampal formation. No hemorrhage or mass effect. Consider transient global amnesia (TGA)."  Did not know that TGA had a particular appearance on MRI. I always thought  that it was a clinical diagnosis.  Acute metabolic encephalopathy Resolved. Unclear the cause.  Type 2 diabetes mellitus (HCC) Stable.  CKD (chronic kidney disease), stage IV (HCC) Stable. Scr 2.36  Hypothyroidism Stable. On synthroid 50 mcg daily  GERD Stable. On protonix 40 mg qd  HTN (hypertension) Stable. Continue with coreg 6.25 mg qday. Pt states he only takes this once a day, norvasc 5 mg daily.   Condition at discharge: good  Exam Physical Exam Vitals and nursing note reviewed.  HENT:     Head: Normocephalic and atraumatic.  Eyes:     Comments: Right cornea is cloudy. Pt is blind in right eye  Pulmonary:     Effort: Pulmonary effort is normal. No respiratory distress.  Skin:    General: Skin is warm and dry.     Capillary Refill: Capillary refill takes less than 2 seconds.  Neurological:     Mental Status: He is alert and oriented to person, place, and time.     Disposition: Home  Discharge time: greater than 30 minutes. Allergies as of 03/14/2023       Reactions   Lisinopril Other (See Comments)   INTOLERANCE to all ACE(s)   Valsartan Other (See Comments)   INTOLERANCE to all ARB(s)        Medication List     TAKE these medications    Accu-Chek FastClix Lancets Misc TEST ONCE DAILY AS NEEDED   Accu-Chek Guide test strip Generic drug: glucose blood TEST DAILY AS NEEDED   Accu-Chek Guide w/Device Kit USE AS DIRECTED.   amLODipine 5 MG tablet Commonly known as: NORVASC Take 1 tablet (5 mg total) by mouth daily.   Artificial Tears PF 0.1-0.3 % Soln Generic drug: Dextran 70-Hypromellose (PF) Place 1 drop into the right eye 3 (three) times daily as needed (for irritation).   aspirin EC 81 MG tablet Take 81 mg by mouth daily.   atorvastatin 40 MG tablet Commonly known as: LIPITOR Take 1 tablet (40 mg total) by mouth daily.   carvedilol 6.25 MG tablet Commonly known as: COREG Take 1 tablet (6.25 mg total) by mouth 2 (two) times  daily. What changed: when to take this   Centrum Silver 50+Men Tabs Take 1 tablet by mouth daily as needed (for supplementation).   fexofenadine 180 MG tablet Commonly known as: ALLEGRA Take 1 tablet (180 mg total) by mouth daily.   levothyroxine 50 MCG tablet Commonly known as: SYNTHROID Take 1 tablet (50 mcg total) by mouth daily before breakfast.   loratadine 10 MG tablet Commonly known as: CLARITIN Take 10 mg by mouth daily as needed for allergies or rhinitis.   pantoprazole 40 MG tablet Commonly known as: PROTONIX TAKE 1 TABLET BY MOUTH DAILY What changed: when to take this   PREVAGEN PO Take 1 tablet by mouth daily.   tamsulosin 0.4 MG Caps capsule Commonly known as: FLOMAX Take 1 capsule (0.4 mg total) by mouth daily.        MR BRAIN WO CONTRAST  Result Date: 03/13/2023 CLINICAL DATA:  89 year old male with altered mental status. Confusion, unable to recognize family. EXAM: MRI  HEAD WITHOUT CONTRAST TECHNIQUE: Multiplanar, multiecho pulse sequences of the brain and surrounding structures were obtained without intravenous contrast. COMPARISON:  Head CT yesterday.  Brain MRI 09/29/2018. FINDINGS: Brain: Subtle asymmetric and punctate diffusion abnormality in the right hippocampal formation series 5, image 18 and series 13, image 17, little if any correlation on ADC. No significant T2 or FLAIR hyperintensity there. No other restricted diffusion to suggest acute infarction. No midline shift, mass effect, evidence of mass lesion, ventriculomegaly, extra-axial collection or acute intracranial hemorrhage. Cervicomedullary junction and pituitary are within normal limits. Cerebral volume is not significantly changed since 2020. Chronic white matter T2 and FLAIR hyperintensity, including focal encephalomalacia of the corpus callosum, appears stable. Elsewhere gray and white matter signal largely normal for age. No cortical encephalomalacia identified. Occasional chronic micro  hemorrhages in the brain on SWI (right centrum semiovale series 10, image 38). Deep gray nuclei, brainstem and cerebellum within normal limits. Vascular: Major intracranial vascular flow voids are stable since 2020. Skull and upper cervical spine: Chronic cervical disc degeneration. Visualized bone marrow signal is within normal limits. No visible cervical spinal stenosis. 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 Left orbit appears stable and negative. Paranasal sinuses and mastoids are stable and well aerated. Other: Grossly normal visible internal auditory structures. IMPRESSION: 1. Subtle, punctate diffusion asymmetry in the right hippocampal formation. No hemorrhage or mass effect. Consider transient global amnesia (TGA). 2. Chronic cerebral white matter disease, most affecting the corpus callosum, not significantly changed since the 2020 MRI. No other acute intracranial abnormality. Electronically Signed   By: Odessa Fleming M.D.   On: 03/13/2023 09:58   CT HEAD WO CONTRAST  Result Date: 03/12/2023 CLINICAL DATA:  Mental status change EXAM: CT HEAD WITHOUT CONTRAST TECHNIQUE: Contiguous axial images were obtained from the base of the skull through the vertex without intravenous contrast. RADIATION DOSE REDUCTION: This exam was performed according to the departmental dose-optimization program which includes automated exposure control, adjustment of the mA and/or kV according to patient size and/or use of iterative reconstruction technique. COMPARISON:  None Available. FINDINGS: Brain: No evidence of acute infarction, hemorrhage, hydrocephalus, extra-axial collection or mass lesion/mass effect. Brain parenchymal volume loss and deep white matter microangiopathy, moderate. Vascular: Choose Skull: Normal. Negative for fracture or focal lesion. Sinuses/Orbits: No acute finding. Chronic appearing deformity of the right globe. Other: None. IMPRESSION: 1. No acute intracranial abnormality. 2. Brain parenchymal  volume loss and deep white matter microangiopathy, moderate. 3. Chronic appearing deformity of the right globe. Electronically Signed   By: Ted Mcalpine M.D.   On: 03/12/2023 16:01   DG Chest 2 View  Result Date: 03/12/2023 CLINICAL DATA:  Altered mental status. EXAM: CHEST - 2 VIEW COMPARISON:  August 02, 2022 FINDINGS: Normal cardiac silhouette. Calcific atherosclerotic disease and tortuosity of the aorta. No evidence of focal consolidation. No acute osseous abnormalities.  Metallic shrapnel again seen. IMPRESSION: No acute abnormality. Electronically Signed   By: Ted Mcalpine M.D.   On: 03/12/2023 15:51   Results for orders placed or performed during the hospital encounter of 11/10/21  SARS CORONAVIRUS 2 (TAT 6-24 HRS) Nasopharyngeal Nasopharyngeal Swab     Status: None   Collection Time: 11/09/21 11:10 AM   Specimen: Nasopharyngeal Swab  Result Value Ref Range Status   SARS Coronavirus 2 NEGATIVE NEGATIVE Final    Comment: (NOTE) SARS-CoV-2 target nucleic acids are NOT DETECTED.  The SARS-CoV-2 RNA is generally detectable in upper and lower respiratory specimens during the acute phase of infection. Negative  results do not preclude SARS-CoV-2 infection, do not rule out co-infections with other pathogens, and should not be used as the sole basis for treatment or other patient management decisions. Negative results must be combined with clinical observations, patient history, and epidemiological information. The expected result is Negative.  Fact Sheet for Patients: HairSlick.no  Fact Sheet for Healthcare Providers: quierodirigir.com  This test is not yet approved or cleared by the [place] FDA and  has been authorized for detection and/or diagnosis of SARS-CoV-2 by FDA under an Emergency Use Authorization (EUA). This EUA will remain  in effect (meaning this test can be used) for the duration of the COVID-19  declaration under Se ction 564(b)(1) of the Act, 21 U.S.C. section 360bbb-3(b)(1), unless the authorization is terminated or revoked sooner.  Performed at [facility], [address] [place], [place] [address]     Signed:  Carollee Herter DO.  [facility] 03/14/2023, 3:03 PM

## 2023-03-14 NOTE — Assessment & Plan Note (Signed)
Resolved. Unclear the cause.

## 2023-03-14 NOTE — Plan of Care (Signed)

## 2023-03-14 NOTE — TOC Transition Note (Signed)
Transition of Care [facility]) - CM/SW Discharge Note   Patient Details  Name: Greg [name] MRN: [id number] Date of Birth: 08/19/1933  Transition of Care [facility]) CM/SW Contact:  Kermit Balo, RN Phone Number: 03/14/2023, 2:14 PM   Clinical Narrative:    Pt is from home alone. He states his son can check in on him after d/c.  Pt drives self as needed.  Pt manages his own medications and denies any issues.  No DME at home. States son will provide transport home when discharged.   Final next level of care: Home/Self Care Barriers to Discharge: No Barriers Identified   Patient Goals and CMS Choice      Discharge Placement                         Discharge Plan and Services Additional resources added to the After Visit Summary for                                       Social Determinants of Health (SDOH) Interventions SDOH Screenings   Food Insecurity: No Food Insecurity (05/11/2021)  Housing: Low Risk  (05/11/2021)  Transportation Needs: No Transportation Needs (05/11/2021)  Alcohol Screen: Low Risk  (05/11/2021)  Depression (PHQ2-9): Low Risk  (02/22/2023)  Financial Resource Strain: Low Risk  (05/11/2021)  Physical Activity: Sufficiently Active (05/11/2021)  Social Connections: Moderately Integrated (05/11/2021)  Stress: No Stress Concern Present (05/11/2021)  Tobacco Use: Medium Risk (03/12/2023)     Readmission Risk Interventions     No data to display

## 2023-03-14 NOTE — Assessment & Plan Note (Signed)
Stable. On synthroid 50 mcg daily

## 2023-03-14 NOTE — Subjective & Objective (Signed)
Transferred to [facility] from [facility] for MRI brain. Pt feels well and back to normal. He is requesting immediate discharge to home. He does not want to stay another night in the hospital.

## 2023-03-14 NOTE — Evaluation (Signed)
Physical Therapy Evaluation Patient Details Name: Greg [name] MRN: [id number] DOB: 1933-01-29 Today's Date: 03/14/2023  History of Present Illness  89 y.o. male presents to [facility] hospital on 03/12/2023 with AMS. CT head negative, EEG results pending. PMH includes anxiety, R eye blindness, GERD, glaucoma, nocturia.  Clinical Impression  Pt presents to PT at or near his functional baseline. Pt is able to mobilize independently and tolerates dynamic gait and balance challenges without deviation. Pt does not appear to have further PT needs at this time. PT signing off.        Assistance Recommended at Discharge None  If plan is discharge home, recommend the following:  Can travel by private vehicle           Equipment Recommendations None recommended by PT  Recommendations for Other Services       Functional Status Assessment Patient has not had a recent decline in their functional status     Precautions / Restrictions Precautions Precautions: None Restrictions Weight Bearing Restrictions: No      Mobility  Bed Mobility                    Transfers Overall transfer level: Independent                      Ambulation/Gait Ambulation/Gait assistance: Independent Gait Distance (Feet): 500 Feet Assistive device: None Gait Pattern/deviations: WFL(Within Functional Limits) Gait velocity: functional Gait velocity interpretation: >2.62 ft/sec, indicative of community ambulatory   General Gait Details: steady step-through gait, tolerates dynamic gait challenges without significant deviation  Stairs            Wheelchair Mobility     Tilt Bed    Modified Rankin (Stroke Patients Only)       Balance Overall balance assessment: Independent                                           Pertinent Vitals/Pain Pain Assessment Pain Assessment: No/denies pain    Home Living Family/patient expects to be discharged to:: Private  residence Living Arrangements: Alone Available Help at Discharge: Family;Available PRN/intermittently Type of Home: House Home Access: Level entry       Home Layout: One level Home Equipment: Agricultural consultant (2 wheels);Cane - single point;Wheelchair - manual      Prior Function Prior Level of Function : Independent/Modified Independent;Driving             Mobility Comments: mows his own Scientist, physiological Dominance        Extremity/Trunk Assessment   Upper Extremity Assessment Upper Extremity Assessment: Overall WFL for tasks assessed    Lower Extremity Assessment Lower Extremity Assessment: Overall WFL for tasks assessed    Cervical / Trunk Assessment Cervical / Trunk Assessment: Normal  Communication   Communication: No difficulties  Cognition Arousal/Alertness: Awake/alert Behavior During Therapy: WFL for tasks assessed/performed Overall Cognitive Status: Within Functional Limits for tasks assessed                                          General Comments General comments (skin integrity, edema, etc.): VSS on RA    Exercises     Assessment/Plan    PT Assessment Patient does not need  any further PT services  PT Problem List         PT Treatment Interventions      PT Goals (Current goals can be found in the Care Plan section)       Frequency       Co-evaluation               AM-PAC PT "6 Clicks" Mobility  Outcome Measure Help needed turning from your back to your side while in a flat bed without using bedrails?: None Help needed moving from lying on your back to sitting on the side of a flat bed without using bedrails?: None Help needed moving to and from a bed to a chair (including a wheelchair)?: None Help needed standing up from a chair using your arms (e.g., wheelchair or bedside chair)?: None Help needed to walk in hospital room?: None Help needed climbing 3-5 steps with a railing? : None 6 Click Score: 24     End of Session   Activity Tolerance: Patient tolerated treatment well Patient left: in chair;with call bell/phone within reach Nurse Communication: Mobility status PT Visit Diagnosis: Other symptoms and signs involving the nervous system (R29.898)    Time: 0998-3382 PT Time Calculation (min) (ACUTE ONLY): 15 min   Charges:   PT Evaluation $PT Eval Low Complexity: 1 Low   PT General Charges $$ ACUTE PT VISIT: 1 Visit       Arlyss Gandy, PT, DPT Acute Rehabilitation Office [phone]   Arlyss Gandy 03/14/2023, 2:18 PM

## 2023-03-14 NOTE — Progress Notes (Signed)
Patient discharged, discharge instructions given, dropped to main entrance A to son's car.

## 2023-03-14 NOTE — Progress Notes (Signed)
OT Cancellation Note  Patient Details Name: Greg [name] MRN: [id number] DOB: 05/21/1933   Cancelled Treatment:    Reason Eval/Treat Not Completed: Other (comment) (Pt independently dressed and leaving for discharge upon arrival.)  Donia Pounds 03/14/2023, 3:41 PM

## 2023-03-14 NOTE — Assessment & Plan Note (Signed)
Pt admitted to hospital. Initial CT head negative. Could not get MRI at [facility] hospital. Needed to be transferred to [facility] for MRI. MRI brain negative for CVA.  EEG performed but not yet read. Pt wanting to go home and does not want to stay another night in the hospital. Seen by PT and able to ambulate without difficulty. Pt agrees to f/u with PCP regarding his EEG results. Given his resolved confusion, doubt he had a seizure. MRI brain showed "Subtle, punctate diffusion asymmetry in the right hippocampal formation. No hemorrhage or mass effect. Consider transient global amnesia (TGA)."  Did not know that TGA had a particular appearance on MRI. I always thought that it was a clinical diagnosis.

## 2023-03-14 NOTE — Progress Notes (Signed)
EEG complete - results pending 

## 2023-03-14 NOTE — Assessment & Plan Note (Signed)
Stable

## 2023-03-14 NOTE — Assessment & Plan Note (Signed)
Stable. On protonix 40 mg qd

## 2023-03-14 NOTE — Assessment & Plan Note (Signed)
Stable. Scr 2.36

## 2023-03-14 NOTE — Assessment & Plan Note (Signed)
Stable. Continue with coreg 6.25 mg qday. Pt states he only takes this once a day, norvasc 5 mg daily.

## 2023-03-14 NOTE — Hospital Course (Signed)
HPI:  Greg [name] is a 89 y.o. male with medical history significant of hypertension, hyperlipidemia, type 2 diabetes, CKD stage IV, GERD, anxiety, glaucoma, right eye blindness, hypothyroidism, BPH presenting to the ED for evaluation of confusion, LKW at 5 or 6 PM yesterday.  Hypertensive with systolic in the 160s to 170s, remainder of vital signs stable.  Labs showing WBC 3.6 (slightly low on previous labs as well), hemoglobin 10.2 (at baseline), platelet count 146k, sodium 135, bicarb 20, anion gap 7, glucose 116, creatinine 2.3 (at baseline), ammonia level normal, blood ethanol level undetectable, TSH normal, UA not suggestive of infection.  Chest x-ray showing no acute abnormality.  CT head negative for acute intracranial abnormality.  ED physician discussed the case with on-call neurologist who recommended obtaining brain MRI to rule out stroke.   History provided by the patient and his son at bedside.  Son states he saw the patient yesterday at 5 PM and he was acting normal.  This morning patient went to church with a friend who told the patient's son that patient was acting normal the entire time he was at church.  He then returned home at around 1:30 PM and son noticed that patient was acting strange.  He appeared very confused and was still having difficulty finding keys to open his front door.  He was walking around the house not knowing what he was doing and constantly touching objects.  Son brought him a pizza to eat which is his favorite food but he was not excited.  Son did not notice any weakness of arm or leg, facial droop, or slurred speech.  His confusion has now improved.  Patient denies history of seizures or previous stroke.  He reports chronic occasional cough which he attributes to postnasal drip from allergies.  Denies fevers, shortness of breath, chest pain, nausea, vomiting, abdominal pain, diarrhea, dysuria, or urinary frequency/urgency.  Significant Events: [facility] to [facility] on  03-12-2023  Significant Labs: BUN 26, Scr 2.36 TSH 2.065   Significant Imaging Studies: MRI brain negative for stroke. Did show "Subtle, punctate diffusion asymmetry in the right hippocampal formation. No hemorrhage or mass effect. Consider transient global amnesia (TGA)."  Also " Chronic cerebral white matter disease, most affecting the corpus callosum, not significantly changed since the 2020 MRI. No other acute intracranial abnormality" CT head negative for CVA EEG completed. Final report PENDING  Antibiotic Therapy:   Procedures:   Consultants:

## 2023-03-14 NOTE — Plan of Care (Signed)

## 2023-03-16 ENCOUNTER — Telehealth: Payer: Self-pay

## 2023-03-16 ENCOUNTER — Ambulatory Visit (INDEPENDENT_AMBULATORY_CARE_PROVIDER_SITE_OTHER): Payer: Medicare Other | Admitting: Internal Medicine

## 2023-03-16 VITALS — BP 128/68 | HR 62 | Temp 97.9°F | Ht 70.0 in | Wt 172.0 lb

## 2023-03-16 DIAGNOSIS — J309 Allergic rhinitis, unspecified: Secondary | ICD-10-CM | POA: Diagnosis not present

## 2023-03-16 DIAGNOSIS — N184 Chronic kidney disease, stage 4 (severe): Secondary | ICD-10-CM

## 2023-03-16 DIAGNOSIS — I10 Essential (primary) hypertension: Secondary | ICD-10-CM

## 2023-03-16 DIAGNOSIS — R4182 Altered mental status, unspecified: Secondary | ICD-10-CM

## 2023-03-16 DIAGNOSIS — Z87898 Personal history of other specified conditions: Secondary | ICD-10-CM

## 2023-03-16 MED ORDER — AZELASTINE HCL 0.1 % NA SOLN: 2.0000 | Freq: Two times a day (BID) | NASAL | 12 refills | Status: AC

## 2023-03-16 NOTE — Transitions of Care (Post Inpatient/ED Visit) (Signed)
   03/16/2023  Name: ZEPH RIEBEL MRN: [id number] DOB: [birth year]  Today's TOC FU Call Status: Today's TOC FU Call Status:: Unsuccessul Call (1st Attempt) Unsuccessful Call (1st Attempt) Date: 03/16/23  Attempted to reach the patient regarding the most recent Inpatient/ED visit.  Follow Up Plan: Additional outreach attempts will be made to reach the patient to complete the Transitions of Care (Post Inpatient/ED visit) call.     Antionette Fairy, RN,BSN,CCM [facility] Care Management Care Management Community Coordinator Direct Phone: [phone] Toll Free: [phone] Fax: [phone]

## 2023-03-16 NOTE — Procedures (Signed)
Patient Name: Greg [name]  MRN: [id number]  Epilepsy Attending: Charlsie Quest  Referring Physician/Provider: John Giovanni, MD  Date: 03/14/2023 Duration: 23. 08 mins  Patient history: 89yo M with ams getting eeg to evaluate for seizure.  Level of alertness: Awake, drowsy  AEDs during EEG study: None  Technical aspects: This EEG study was done with scalp electrodes positioned according to the 10-20 International system of electrode placement. Electrical activity was reviewed with band pass filter of 1-70Hz , sensitivity of 7 uV/mm, display speed of 4mm/sec with a 60Hz  notched filter applied as appropriate. EEG data were recorded continuously and digitally stored.  Video monitoring was available and reviewed as appropriate.  Description: The posterior dominant rhythm consists of 8 Hz activity of moderate voltage (25-35 uV) seen predominantly in posterior head regions, symmetric and reactive to eye opening and eye closing. Drowsiness was characterized by attenuation of the posterior background rhythm. Hyperventilation and photic stimulation were not performed.     IMPRESSION: This study is within normal limits. No seizures or epileptiform discharges were seen throughout the recording. A normal interictal EEG does not exclude the diagnosis of epilepsy.  Shulem Mader Annabelle Harman

## 2023-03-16 NOTE — Transitions of Care (Post Inpatient/ED Visit) (Signed)
03/16/2023  Name: Greg [name] MRN: [id number] DOB: 1932/12/23  Today's TOC FU Call Status: Today's TOC FU Call Status:: Successful TOC FU Call Competed TOC FU Call Complete Date: 03/16/23 (Incoming call from patient returning RN CM call.)  Transition Care Management Follow-up Telephone Call Date of Discharge: 03/14/23 Discharge Facility: [facility]) Type of Discharge: Inpatient Admission Primary Inpatient Discharge Diagnosis:: "AMS" How have you been since you were released from the hospital?: Better (Pt pleased to report he is "doing better-thinks he "just got overheated" which caused his sxs and for him to go to ED.He states he is back at baseline-has been out & about today and going to MD appt later. Appetite good. BM today. Denies any acute issues) Any questions or concerns?: No  Items Reviewed: Did you receive and understand the discharge instructions provided?: Yes Medications obtained,verified, and reconciled?: Yes (Medications Reviewed) Any new allergies since your discharge?: No Dietary orders reviewed?: Yes Type of Diet Ordered:: low salt/heart healthy/carb modified Do you have support at home?: Yes People in Home: alone, child(ren), adult Name of Support/Comfort Primary Source: son lives nearby and checks on him  Medications Reviewed Today: Medications Reviewed Today     Reviewed by Charlyn Minerva, RN (Registered Nurse) on 03/16/23 at 1330  Med List Status: <None>   Medication Order Taking? Sig Documenting Provider Last Dose Status Informant  Accu-Chek FastClix Lancets MISC [id number] Yes TEST ONCE DAILY AS NEEDED Corwin Levins, MD Taking Active Multiple Informants  amLODipine (NORVASC) 5 MG tablet [id number] Yes Take 1 tablet (5 mg total) by mouth daily. Wendall Stade, MD Taking Active Multiple Informants  Apoaequorin (PREVAGEN PO) [id number] Yes Take 1 tablet by mouth daily. [provider] Taking Active Multiple Informants  aspirin EC 81  MG tablet [id number] Yes Take 81 mg by mouth daily. [provider] Taking Active Multiple Informants  atorvastatin (LIPITOR) 40 MG tablet [id number] Yes Take 1 tablet (40 mg total) by mouth daily. Corwin Levins, MD Taking Active Multiple Informants  Blood Glucose Monitoring Suppl (ACCU-CHEK GUIDE) w/Device KIT [id number] Yes USE AS DIRECTED. Corwin Levins, MD Taking Active Multiple Informants  carvedilol (COREG) 6.25 MG tablet [id number] Yes Take 1 tablet (6.25 mg total) by mouth 2 (two) times daily.  Patient taking differently: Take 6.25 mg by mouth in the morning.   Wendall Stade, MD Taking Active Multiple Informants           Med Note Salvatore Marvel   TDV Mar 12, 2023  8:12 PM) The patient said he takes this only ONCE a day  Dextran 70-Hypromellose, PF, (ARTIFICIAL TEARS PF) 0.1-0.3 % SOLN [id number] Yes Place 1 drop into the right eye 3 (three) times daily as needed (for irritation). [provider] Taking Active Multiple Informants  fexofenadine (ALLEGRA) 180 MG tablet [id number] No Take 1 tablet (180 mg total) by mouth daily.  Patient not taking: Reported on 03/12/2023   Corwin Levins, MD Not Taking Active Multiple Informants  glucose blood (ACCU-CHEK GUIDE) test strip [id number] Yes TEST DAILY AS NEEDED Corwin Levins, MD Taking Active Multiple Informants  levothyroxine (SYNTHROID) 50 MCG tablet [id number] Yes Take 1 tablet (50 mcg total) by mouth daily before breakfast. Corwin Levins, MD Taking Active Multiple Informants  loratadine (CLARITIN) 10 MG tablet [id number] Yes Take 10 mg by mouth daily as needed for allergies or rhinitis. [provider] Taking Active Multiple Informants  Multiple Vitamins-Minerals (CENTRUM SILVER 50+MEN) TABS [id number] Yes Take 1  tablet by mouth daily as needed (for supplementation). [provider] Taking Active Multiple Informants  pantoprazole (PROTONIX) 40 MG tablet [id number] Yes TAKE 1 TABLET BY MOUTH DAILY  Patient taking  differently: Take 40 mg by mouth daily before breakfast.   Corwin Levins, MD Taking Active Multiple Informants  tamsulosin [facility]) 0.4 MG CAPS capsule [id number] Yes Take 1 capsule (0.4 mg total) by mouth daily. Corwin Levins, MD Taking Active Multiple Informants            Home Care and Equipment/Supplies: Were Home Health Services Ordered?: NA Any new equipment or medical supplies ordered?: NA  Functional Questionnaire: Do you need assistance with bathing/showering or dressing?: No Do you need assistance with meal preparation?: No Do you need assistance with eating?: No Do you have difficulty maintaining continence: No Do you need assistance with getting out of bed/getting out of a chair/moving?: No Do you have difficulty managing or taking your medications?: No  Follow up appointments reviewed: PCP Follow-up appointment confirmed?: Yes Date of PCP follow-up appointment?: 03/16/23 Follow-up Provider: Dr. Jonny Ruiz Specialist [facility] Follow-up appointment confirmed?: NA Do you need transportation to your follow-up appointment?: No (pt confirms he is able to drive himself to appts) Do you understand care options if your condition(s) worsen?: Yes-patient verbalized understanding  SDOH Interventions Today    Flowsheet Row Most Recent Value  SDOH Interventions   Food Insecurity Interventions Intervention Not Indicated  Transportation Interventions Intervention Not Indicated      TOC Interventions Today    Flowsheet Row Most Recent Value  TOC Interventions   TOC Interventions Discussed/Reviewed TOC Interventions Discussed      Interventions Today    Flowsheet Row Most Recent Value  Chronic Disease   Chronic disease during today's visit Hypertension (HTN)  General Interventions   General Interventions Discussed/Reviewed General Interventions Discussed, Doctor Visits, Durable Medical Equipment (DME)  Doctor Visits Discussed/Reviewed Doctor Visits Discussed, PCP, Specialist   Durable Medical Equipment (DME) BP Cuff, Glucomoter  [pt reports  BP today 120/70 and cbgs [id number], 114- he is recording values and will take logs to MD appt]  PCP/Specialist Visits Compliance with follow-up visit  Education Interventions   Education Provided Provided Education  Provided Verbal Education On Nutrition, When to see the doctor, Medication, Blood Sugar Monitoring, Other  [sx mgmt]  Nutrition Interventions   Nutrition Discussed/Reviewed Nutrition Discussed, Adding fruits and vegetables, Increasing proteins, Fluid intake, Decreasing sugar intake, Decreasing salt, Decreasing fats  Pharmacy Interventions   Pharmacy Dicussed/Reviewed Pharmacy Topics Discussed, Medications and their functions  Safety Interventions   Safety Discussed/Reviewed Safety Discussed, Home Safety        Alessandra Grout [facility] Care Management Care Management Community Coordinator Direct Phone: [phone] Toll Free: [phone] Fax: [phone]
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
## 2023-03-19 ENCOUNTER — Encounter: Payer: Self-pay | Admitting: Internal Medicine

## 2023-03-19 NOTE — Assessment & Plan Note (Signed)
Resolved without specific etiology and supportive care, I suspect in retrospective was heat related illness now resolved.   to f/u any worsening symptoms or concerns

## 2023-03-19 NOTE — Assessment & Plan Note (Signed)
Also for start astelin nasal asd

## 2023-03-19 NOTE — Assessment & Plan Note (Signed)
Lab Results  Component Value Date   CREATININE 2.36 (H) 03/12/2023   Stable overall, cont to avoid nephrotoxins

## 2023-03-19 NOTE — Assessment & Plan Note (Signed)
BP Readings from Last 3 Encounters:  03/16/23 128/68  03/14/23 (!) 147/96  02/22/23 138/82   Stable, pt to continue medical treatment norvasc 5 every day, coreg 6.25 bid

## 2023-03-19 NOTE — Patient Instructions (Signed)
Please continue all other medications as before, and refills have been done if requested.  Please have the pharmacy call with any other refills you may need.  Please continue your efforts at being more active, low cholesterol diet, and weight control.  Please keep your appointments with your specialists as you may have planned     

## 2023-03-21 ENCOUNTER — Other Ambulatory Visit: Payer: Self-pay

## 2023-03-21 MED ORDER — ATORVASTATIN CALCIUM 40 MG PO TABS: 40.0000 mg | ORAL_TABLET | Freq: Every day | ORAL | 3 refills | Status: DC

## 2023-03-21 MED ORDER — AMLODIPINE BESYLATE 5 MG PO TABS: 5.0000 mg | ORAL_TABLET | Freq: Every day | ORAL | 2 refills | Status: DC

## 2023-03-31 ENCOUNTER — Telehealth: Payer: Self-pay | Admitting: Internal Medicine

## 2023-03-31 NOTE — Telephone Encounter (Signed)
Patient called and asked if he is able to get another shingles shot. He said he has gotten two previously, but occasionally has shingles symptoms. Patient would like a call back at [phone].

## 2023-03-31 NOTE — Telephone Encounter (Signed)
Sorry, I cannot agree with the further shots for shingrix is he has completed 2 shots.  No need for further shots.  Consider OV for the symptoms he mentioned.  thanks

## 2023-04-01 NOTE — Telephone Encounter (Signed)
Spoke with pt and advised him that per Provider he does agree with getting another shingle shot and was told to look for OTC meds to help with his symptoms. He understood and had no further questions.
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
## 2023-05-03 ENCOUNTER — Ambulatory Visit: Payer: Medicare Other | Attending: Cardiovascular Disease | Admitting: Cardiovascular Disease

## 2023-05-03 ENCOUNTER — Encounter: Payer: Self-pay | Admitting: Cardiovascular Disease

## 2023-05-03 VITALS — BP 130/66 | HR 91 | Ht 69.0 in | Wt 172.4 lb

## 2023-05-03 DIAGNOSIS — I10 Essential (primary) hypertension: Secondary | ICD-10-CM

## 2023-05-03 DIAGNOSIS — I739 Peripheral vascular disease, unspecified: Secondary | ICD-10-CM | POA: Diagnosis not present

## 2023-05-03 NOTE — Patient Instructions (Signed)
Medication Instructions:   Your physician recommends that you continue on your current medications as directed. Please refer to the Current Medication list given to you today.  *If you need a refill on your cardiac medications before your next appointment, please call your pharmacy*    Follow-Up: At [facility], you and your health needs are our priority.  As part of our continuing mission to provide you with exceptional heart care, we have created designated Provider Care Teams.  These Care Teams include your primary Cardiologist (physician) and Advanced Practice Providers (APPs -  Physician Assistants and Nurse Practitioners) who all work together to provide you with the care you need, when you need it.  We recommend signing up for the patient portal called "MyChart".  Sign up information is provided on this After Visit Summary.  MyChart is used to connect with patients for Virtual Visits (Telemedicine).  Patients are able to view lab/test results, encounter notes, upcoming appointments, etc.  Non-urgent messages can be sent to your provider as well.   To learn more about what you can do with MyChart, go to [email or web address].    Your next appointment:   1 year(s)  Provider:   Charlton Haws, MD

## 2023-05-09 ENCOUNTER — Encounter (INDEPENDENT_AMBULATORY_CARE_PROVIDER_SITE_OTHER): Payer: Medicare Other | Admitting: Ophthalmology

## 2023-05-09 DIAGNOSIS — H43812 Vitreous degeneration, left eye: Secondary | ICD-10-CM | POA: Diagnosis not present

## 2023-05-09 DIAGNOSIS — H353122 Nonexudative age-related macular degeneration, left eye, intermediate dry stage: Secondary | ICD-10-CM | POA: Diagnosis not present

## 2023-05-09 DIAGNOSIS — D3132 Benign neoplasm of left choroid: Secondary | ICD-10-CM

## 2023-05-09 DIAGNOSIS — H2512 Age-related nuclear cataract, left eye: Secondary | ICD-10-CM | POA: Diagnosis not present

## 2023-05-16 ENCOUNTER — Telehealth: Payer: Self-pay | Admitting: Internal Medicine

## 2023-05-16 ENCOUNTER — Other Ambulatory Visit: Payer: Self-pay

## 2023-05-16 MED ORDER — ACCU-CHEK GUIDE VI STRP: ORAL_STRIP | 2 refills | Status: DC

## 2023-05-16 NOTE — Telephone Encounter (Signed)
Refill sent.

## 2023-05-16 NOTE — Telephone Encounter (Signed)
Prescription Request  05/16/2023  LOV: 03/16/2023  What is the name of the medication or equipment? glucose blood (ACCU-CHEK GUIDE) test strip [   Have you contacted your pharmacy to request a refill? No   Which pharmacy would you like this sent to?    [facility] [phone] - [place], [place] - [address] AT [facility] [address] [place] [address] Phone: [phone] Fax: [phone]   Patient notified that their request is being sent to the clinical staff for review and that they should receive a response within 2 business days.   Please advise at Mobile [phone] (mobile)

## 2023-06-07 ENCOUNTER — Telehealth: Payer: Self-pay | Admitting: Internal Medicine

## 2023-06-07 NOTE — Telephone Encounter (Signed)
Greg [name] from [employer] [phone]  - Ref# [id number] - Needs permission to switch manufacturers on Levothyroxine

## 2023-06-08 NOTE — Telephone Encounter (Signed)
Ok for switch 

## 2023-06-08 NOTE — Telephone Encounter (Signed)
Called and let pharmacy know

## 2023-06-13 ENCOUNTER — Ambulatory Visit
Admission: RE | Admit: 2023-06-13 | Discharge: 2023-06-13 | Disposition: A | Discharge status: Home / Self Care | Payer: Medicare Other | Source: Ambulatory Visit | Attending: Medical

## 2023-06-13 ENCOUNTER — Other Ambulatory Visit: Payer: Self-pay | Admitting: Medical

## 2023-06-13 DIAGNOSIS — M25551 Pain in right hip: Secondary | ICD-10-CM

## 2023-06-13 DIAGNOSIS — S32592A Other specified fracture of left pubis, initial encounter for closed fracture: Secondary | ICD-10-CM | POA: Diagnosis not present

## 2023-06-13 DIAGNOSIS — M1611 Unilateral primary osteoarthritis, right hip: Secondary | ICD-10-CM | POA: Diagnosis not present

## 2023-06-17 DIAGNOSIS — M25512 Pain in left shoulder: Secondary | ICD-10-CM | POA: Diagnosis not present

## 2023-06-20 ENCOUNTER — Telehealth: Payer: Self-pay | Admitting: Internal Medicine

## 2023-06-20 NOTE — Telephone Encounter (Signed)
Patient called and said he usually gets his levothyroxine (SYNTHROID) 50 MCG tablet sent through a mail order pharmacy. He said they haven't sent it to him and said it would be 3-4 days. He said they've been saying that for weeks and he has been out of his medication for 3 weeks. Patient would like to know if a small refill can be sent to [id number] - [place], [place] - [address] AT [address] until his medication gets delivered. Patient would like a call back at [phone].

## 2023-06-21 ENCOUNTER — Other Ambulatory Visit: Payer: Self-pay

## 2023-06-21 MED ORDER — LEVOTHYROXINE SODIUM 50 MCG PO TABS: 50.0000 ug | ORAL_TABLET | Freq: Every day | ORAL | 0 refills | Status: DC

## 2023-06-21 NOTE — Telephone Encounter (Signed)
Called and let Pt know 30 day supply has been sent.

## 2023-06-24 ENCOUNTER — Ambulatory Visit: Payer: Medicare Other | Admitting: Podiatry

## 2023-06-24 ENCOUNTER — Encounter: Payer: Self-pay | Admitting: Podiatry

## 2023-06-24 DIAGNOSIS — R7309 Other abnormal glucose: Secondary | ICD-10-CM

## 2023-06-24 DIAGNOSIS — M79674 Pain in right toe(s): Secondary | ICD-10-CM | POA: Diagnosis not present

## 2023-06-24 DIAGNOSIS — M79675 Pain in left toe(s): Secondary | ICD-10-CM

## 2023-06-24 DIAGNOSIS — B351 Tinea unguium: Secondary | ICD-10-CM | POA: Diagnosis not present

## 2023-06-24 NOTE — Progress Notes (Signed)
This patient presents  to my office for at risk foot care.  This patient requires this care by a professional since this patient will be at risk due to having diabetes and CKD.  This patient is unable to cut nails himself since the patient cannot reach his nails.These nails are painful walking and wearing shoes.  This patient presents for at risk foot care today.  General Appearance  Alert, conversant and in no acute stress.  Vascular  Dorsalis pedis and posterior tibial  pulses are weakly palpable  bilaterally.  Capillary return is within normal limits  bilaterally. Temperature is within normal limits  bilaterally.  Neurologic  Senn-Weinstein monofilament wire test within normal limits  bilaterally. Muscle power within normal limits bilaterally.  Nails Thick disfigured discolored nails with subungual debris  from hallux to fifth toes bilaterally. No evidence of bacterial infection or drainage bilaterally.  Orthopedic  No limitations of motion  feet .  No crepitus or effusions noted.  No bony pathology or digital deformities noted.  Skin  normotropic skin with no porokeratosis noted bilaterally.  No signs of infections or ulcers noted.     Onychomycosis  Pain in right toes  Pain in left toes  Consent was obtained for treatment procedures.   Mechanical debridement of nails 1-5  bilaterally performed with a nail nipper.  Filed with dremel without incident.    Return office visit   3 months                   Told patient to return for periodic foot care and evaluation due to potential at risk complications.   Laurenashley Viar DPM   

## 2023-06-29 ENCOUNTER — Ambulatory Visit: Payer: Medicare Other

## 2023-06-29 VITALS — BP 118/60 | HR 63 | Temp 98.1°F | Resp 16 | Ht 69.0 in | Wt 170.0 lb

## 2023-06-29 DIAGNOSIS — Z0000 Encounter for general adult medical examination without abnormal findings: Secondary | ICD-10-CM
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
## 2023-06-29 NOTE — Patient Instructions (Addendum)
Mr. Schnack , Thank you for taking time to come for your Medicare Wellness Visit. I appreciate your ongoing commitment to your health goals. Please review the following plan we discussed and let me know if I can assist you in the future.   Referrals/Orders/Follow-Ups/Clinician Recommendations: No  This is a list of the screening recommended for you and due dates:  Health Maintenance  Topic Date Due   Eye exam for diabetics  Never done   Complete foot exam   03/08/2020   DTaP/Tdap/Td vaccine (2 - Td or Tdap) 01/17/2022   Hemoglobin A1C  08/24/2023   Medicare Annual Wellness Visit  06/28/2024   Pneumonia Vaccine  Completed   Flu Shot  Completed   Zoster (Shingles) Vaccine  Completed   HPV Vaccine  Aged Out   COVID-19 Vaccine  Discontinued    Advanced directives: (Copy Requested) Please bring a copy of your health care power of attorney and living will to the office to be added to your chart at your convenience.  Next Medicare Annual Wellness Visit scheduled for next year: No

## 2023-07-19 DIAGNOSIS — M25512 Pain in left shoulder: Secondary | ICD-10-CM | POA: Diagnosis not present

## 2023-07-19 DIAGNOSIS — M13812 Other specified arthritis, left shoulder: Secondary | ICD-10-CM | POA: Diagnosis not present

## 2023-08-22 ENCOUNTER — Ambulatory Visit (INDEPENDENT_AMBULATORY_CARE_PROVIDER_SITE_OTHER): Payer: Medicare Other | Admitting: Internal Medicine

## 2023-08-22 ENCOUNTER — Encounter: Payer: Self-pay | Admitting: Internal Medicine

## 2023-08-22 VITALS — BP 160/80 | HR 62 | Temp 98.2°F | Ht 69.0 in | Wt 171.0 lb

## 2023-08-22 DIAGNOSIS — E538 Deficiency of other specified B group vitamins: Secondary | ICD-10-CM | POA: Diagnosis not present

## 2023-08-22 DIAGNOSIS — E039 Hypothyroidism, unspecified: Secondary | ICD-10-CM

## 2023-08-22 DIAGNOSIS — Z0001 Encounter for general adult medical examination with abnormal findings: Secondary | ICD-10-CM

## 2023-08-22 DIAGNOSIS — I10 Essential (primary) hypertension: Secondary | ICD-10-CM

## 2023-08-22 DIAGNOSIS — E559 Vitamin D deficiency, unspecified: Secondary | ICD-10-CM

## 2023-08-22 DIAGNOSIS — Z0000 Encounter for general adult medical examination without abnormal findings: Secondary | ICD-10-CM

## 2023-08-22 DIAGNOSIS — E7800 Pure hypercholesterolemia, unspecified: Secondary | ICD-10-CM | POA: Diagnosis not present

## 2023-08-22 DIAGNOSIS — E1122 Type 2 diabetes mellitus with diabetic chronic kidney disease: Secondary | ICD-10-CM

## 2023-08-22 DIAGNOSIS — N1831 Chronic kidney disease, stage 3a: Secondary | ICD-10-CM

## 2023-08-22 LAB — LIPID PANEL
Cholesterol: 150 mg/dL (ref 0–200)
HDL: 69.3 mg/dL (ref >39.00)
LDL Cholesterol: 66 mg/dL (ref 0–99)
NonHDL: 80.79
Total CHOL/HDL Ratio: 2
Triglycerides: 74 mg/dL (ref 0.0–149.0)
VLDL: 14.8 mg/dL (ref 0.0–40.0)

## 2023-08-22 LAB — HEPATIC FUNCTION PANEL
ALT: 16 U/L (ref 0–53)
AST: 20 U/L (ref 0–37)
Albumin: 3.9 g/dL (ref 3.5–5.2)
Alkaline Phosphatase: 60 U/L (ref 39–117)
Bilirubin, Direct: 0.1 mg/dL (ref 0.0–0.3)
Total Bilirubin: 0.3 mg/dL (ref 0.2–1.2)
Total Protein: 6.5 g/dL (ref 6.0–8.3)

## 2023-08-22 LAB — CBC WITH DIFFERENTIAL/PLATELET
Basophils Absolute: 0 10*3/uL (ref 0.0–0.1)
Basophils Relative: 0.9 % (ref 0.0–3.0)
Eosinophils Absolute: 0.1 10*3/uL (ref 0.0–0.7)
Eosinophils Relative: 2.9 % (ref 0.0–5.0)
HCT: 32.2 % — ABNORMAL LOW (ref 39.0–52.0)
Hemoglobin: 10.7 g/dL — ABNORMAL LOW (ref 13.0–17.0)
Lymphocytes Relative: 32.5 % (ref 12.0–46.0)
Lymphs Abs: 1.3 10*3/uL (ref 0.7–4.0)
MCHC: 33.3 g/dL (ref 30.0–36.0)
MCV: 99.1 fL (ref 78.0–100.0)
Monocytes Absolute: 0.4 10*3/uL (ref 0.1–1.0)
Monocytes Relative: 10 % (ref 3.0–12.0)
Neutro Abs: 2.2 10*3/uL (ref 1.4–7.7)
Neutrophils Relative %: 53.7 % (ref 43.0–77.0)
Platelets: 164 10*3/uL (ref 150.0–400.0)
RBC: 3.24 Mil/uL — ABNORMAL LOW (ref 4.22–5.81)
RDW: 14 % (ref 11.5–15.5)
WBC: 4 10*3/uL (ref 4.0–10.5)

## 2023-08-22 LAB — URINALYSIS, ROUTINE W REFLEX MICROSCOPIC
Bilirubin Urine: NEGATIVE
Hgb urine dipstick: NEGATIVE
Ketones, ur: NEGATIVE
Leukocytes,Ua: NEGATIVE
Nitrite: NEGATIVE
Specific Gravity, Urine: 1.01 (ref 1.000–1.030)
Total Protein, Urine: NEGATIVE
Urine Glucose: NEGATIVE
Urobilinogen, UA: 0.2 (ref 0.0–1.0)
pH: 6 (ref 5.0–8.0)

## 2023-08-22 LAB — BASIC METABOLIC PANEL
BUN: 28 mg/dL — ABNORMAL HIGH (ref 6–23)
CO2: 25 meq/L (ref 19–32)
Calcium: 9 mg/dL (ref 8.4–10.5)
Chloride: 108 meq/L (ref 96–112)
Creatinine, Ser: 1.97 mg/dL — ABNORMAL HIGH (ref 0.40–1.50)
GFR: 29.83 mL/min — ABNORMAL LOW (ref >60.00)
Glucose, Bld: 114 mg/dL — ABNORMAL HIGH (ref 70–99)
Potassium: 5.2 meq/L — ABNORMAL HIGH (ref 3.5–5.1)
Sodium: 140 meq/L (ref 135–145)

## 2023-08-22 LAB — MICROALBUMIN / CREATININE URINE RATIO
Creatinine,U: 80 mg/dL
Microalb Creat Ratio: 2 mg/g (ref 0.0–30.0)
Microalb, Ur: 1.6 mg/dL (ref 0.0–1.9)

## 2023-08-22 LAB — VITAMIN B12: Vitamin B-12: 368 pg/mL (ref 211–911)

## 2023-08-22 LAB — VITAMIN D 25 HYDROXY (VIT D DEFICIENCY, FRACTURES): VITD: 41.59 ng/mL (ref 30.00–100.00)

## 2023-08-22 LAB — HEMOGLOBIN A1C: Hgb A1c MFr Bld: 6.4 % (ref 4.6–6.5)

## 2023-08-22 LAB — TSH: TSH: 3.08 u[IU]/mL (ref 0.35–5.50)

## 2023-08-22 NOTE — Patient Instructions (Signed)

## 2023-08-22 NOTE — Progress Notes (Signed)
The test results show that your current treatment is OK, as the tests are stable.  Please continue the same plan.  There is no other need for change of treatment or further evaluation based on these results, at this time.  thanks 
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
## 2023-08-24 ENCOUNTER — Other Ambulatory Visit: Payer: Self-pay | Admitting: Internal Medicine

## 2023-08-25 ENCOUNTER — Encounter: Payer: Self-pay | Admitting: Internal Medicine

## 2023-08-25 NOTE — Assessment & Plan Note (Addendum)
BP Readings from Last 3 Encounters:  08/22/23 (!) 160/80  06/29/23 118/60  05/03/23 130/66   Uncontrolled, pt states controlled at home, pt to continue medical treatment norvasc 5 every day, coreg 6.25 bid, declines any change today

## 2023-08-25 NOTE — Assessment & Plan Note (Signed)
Lab Results  Component Value Date   HGBA1C 6.4 08/22/2023   Stable, pt to continue current medical treatment  - diet,wt control

## 2023-08-25 NOTE — Assessment & Plan Note (Signed)
Last vitamin D Lab Results  Component Value Date   VD25OH 41.59 08/22/2023   Stable, cont oral replacement

## 2023-08-25 NOTE — Assessment & Plan Note (Signed)
Lab Results  Component Value Date   LDLCALC 66 08/22/2023   Stable, pt to continue current statin lipitor 40 qd

## 2023-08-25 NOTE — Assessment & Plan Note (Signed)

## 2023-08-25 NOTE — Assessment & Plan Note (Signed)
Lab Results  Component Value Date   TSH 3.08 08/22/2023   Stable, pt to continue levothyroxine 50 mcg qd

## 2023-08-29 ENCOUNTER — Telehealth: Payer: Self-pay

## 2023-08-29 NOTE — Telephone Encounter (Signed)
Copied from CRM [id number]. Topic: Clinical - Medication Question >> Aug 29, 2023  4:03 PM Isabell A wrote:  Reason for CRM: Patient would like to know if he can take fiber for his bowel movements.

## 2023-08-30 NOTE — Telephone Encounter (Signed)
 Oh yes, that would be fine to try such as Benefiber    thanks

## 2023-08-30 NOTE — Telephone Encounter (Signed)
Called and left voice mail

## 2023-09-06 ENCOUNTER — Telehealth: Payer: Self-pay | Admitting: Internal Medicine

## 2023-09-06 NOTE — Telephone Encounter (Signed)
 Patient needs the blood strips and lancet the pharmacy told pt he needed to call the dr to get both prescriptions sent over to the pharmacy

## 2023-09-07 ENCOUNTER — Other Ambulatory Visit: Payer: Self-pay

## 2023-09-08 MED ORDER — ACCU-CHEK FASTCLIX LANCETS MISC: 3 refills | Status: DC

## 2023-09-08 MED ORDER — ACCU-CHEK GUIDE TEST VI STRP: ORAL_STRIP | 12 refills | Status: DC

## 2023-09-08 NOTE — Telephone Encounter (Signed)
 Ok done erx

## 2023-09-08 NOTE — Addendum Note (Signed)
 Addended by: Corwin Levins on: 09/08/2023 08:36 PM   Modules accepted: Orders

## 2023-09-09 ENCOUNTER — Other Ambulatory Visit: Payer: Self-pay | Admitting: Cardiovascular Disease

## 2023-09-26 ENCOUNTER — Ambulatory Visit: Payer: Medicare Other | Admitting: Podiatry

## 2023-09-26 ENCOUNTER — Encounter: Payer: Self-pay | Admitting: Podiatry

## 2023-09-26 DIAGNOSIS — M79674 Pain in right toe(s): Secondary | ICD-10-CM

## 2023-09-26 DIAGNOSIS — R7309 Other abnormal glucose: Secondary | ICD-10-CM

## 2023-09-26 DIAGNOSIS — B351 Tinea unguium: Secondary | ICD-10-CM

## 2023-09-26 DIAGNOSIS — M79675 Pain in left toe(s): Secondary | ICD-10-CM

## 2023-09-26 NOTE — Progress Notes (Signed)
This patient presents  to my office for at risk foot care.  This patient requires this care by a professional since this patient will be at risk due to having diabetes and CKD.  This patient is unable to cut nails himself since the patient cannot reach his nails.These nails are painful walking and wearing shoes.  This patient presents for at risk foot care today.  General Appearance  Alert, conversant and in no acute stress.  Vascular  Dorsalis pedis and posterior tibial  pulses are weakly palpable  bilaterally.  Capillary return is within normal limits  bilaterally. Temperature is within normal limits  bilaterally.  Neurologic  Senn-Weinstein monofilament wire test within normal limits  bilaterally. Muscle power within normal limits bilaterally.  Nails Thick disfigured discolored nails with subungual debris  from hallux to fifth toes bilaterally. No evidence of bacterial infection or drainage bilaterally.  Orthopedic  No limitations of motion  feet .  No crepitus or effusions noted.  No bony pathology or digital deformities noted.  Skin  normotropic skin with no porokeratosis noted bilaterally.  No signs of infections or ulcers noted.     Onychomycosis  Pain in right toes  Pain in left toes  Consent was obtained for treatment procedures.   Mechanical debridement of nails 1-5  bilaterally performed with a nail nipper.  Filed with dremel without incident.    Return office visit   3 months                   Told patient to return for periodic foot care and evaluation due to potential at risk complications.   Helane Gunther DPM

## 2023-09-29 ENCOUNTER — Other Ambulatory Visit: Payer: Self-pay | Admitting: Internal Medicine

## 2023-09-29 MED ORDER — ACCU-CHEK FASTCLIX LANCETS MISC: 3 refills | Status: DC

## 2023-09-29 NOTE — Telephone Encounter (Signed)
Copied from CRM [id number]. Topic: Clinical - Medication Refill >> Sep 29, 2023  3:22 PM Maxwell Marion wrote: Most Recent Primary Care Visit:  Provider: Corwin Levins  Department: [facility]  Visit Type: OFFICE VISIT  Date: 08/22/2023  Medication: Accu-Chek FastClix Lancets MISC (pt stated he was able to get the test strips but never got the lancets)  Has the patient contacted their pharmacy? Yes (Agent: If no, request that the patient contact the pharmacy for the refill. If patient does not wish to contact the pharmacy document the reason why and proceed with request.) (Agent: If yes, when and what did the pharmacy advise?) pharmacy advised patient to contact doctor's office  Is this the correct pharmacy for this prescription? Yes If no, delete pharmacy and type the correct one.  This is the patient's preferred pharmacy:  [facility] [phone] - [place], [place] - [address] AT [facility] [address] [place] [address] Phone: [phone] Fax: [phone]    Has the prescription been filled recently? Yes  Is the patient out of the medication? Yes  Has the patient been seen for an appointment in the last year OR does the patient have an upcoming appointment? Yes  Can we respond through MyChart? Yes  Agent: Please be advised that Rx refills may take up to 3 business days. We ask that you follow-up with your pharmacy.

## 2023-10-13 DIAGNOSIS — N481 Balanitis: Secondary | ICD-10-CM | POA: Diagnosis not present

## 2023-10-20 ENCOUNTER — Other Ambulatory Visit: Payer: Self-pay | Admitting: Internal Medicine

## 2023-11-24 ENCOUNTER — Telehealth: Payer: Self-pay | Admitting: Internal Medicine

## 2023-11-24 NOTE — Telephone Encounter (Signed)
 Pt has dropped of a form for his disability license plate and it has been placed in PCP's box.   Please call pt when form is ready for pick up: [phone]

## 2023-11-25 NOTE — Telephone Encounter (Signed)
 Placed on provider desk

## 2023-11-27 ENCOUNTER — Other Ambulatory Visit: Payer: Self-pay | Admitting: Cardiovascular Disease

## 2023-11-27 ENCOUNTER — Other Ambulatory Visit: Payer: Self-pay | Admitting: Internal Medicine

## 2023-12-02 NOTE — Telephone Encounter (Signed)
 Called and left voicemail letting Pt know form is ready for Pick up & will be placed up front.

## 2023-12-26 ENCOUNTER — Ambulatory Visit: Payer: Medicare Other | Admitting: Podiatry

## 2023-12-26 ENCOUNTER — Encounter: Payer: Self-pay | Admitting: Podiatry

## 2023-12-26 DIAGNOSIS — B351 Tinea unguium: Secondary | ICD-10-CM | POA: Diagnosis not present

## 2023-12-26 DIAGNOSIS — R7309 Other abnormal glucose: Secondary | ICD-10-CM

## 2023-12-26 DIAGNOSIS — M79674 Pain in right toe(s): Secondary | ICD-10-CM

## 2023-12-26 DIAGNOSIS — M79675 Pain in left toe(s): Secondary | ICD-10-CM | POA: Diagnosis not present

## 2023-12-26 NOTE — Progress Notes (Signed)
 This patient presents  to my office for at risk foot care.  This patient requires this care by a professional since this patient will be at risk due to having diabetes and CKD.  This patient is unable to cut nails himself since the patient cannot reach his nails.These nails are painful walking and wearing shoes.  This patient presents for at risk foot care today.  General Appearance  Alert, conversant and in no acute stress.  Vascular  Dorsalis pedis and posterior tibial  pulses are weakly palpable  bilaterally.  Capillary return is within normal limits  bilaterally. Temperature is within normal limits  bilaterally.  Neurologic  Senn-Weinstein monofilament wire test within normal limits  bilaterally. Muscle power within normal limits bilaterally.  Nails Thick disfigured discolored nails with subungual debris  from hallux to fifth toes bilaterally. No evidence of bacterial infection or drainage bilaterally.  Orthopedic  No limitations of motion  feet .  No crepitus or effusions noted.  No bony pathology or digital deformities noted.  Skin  normotropic skin with no porokeratosis noted bilaterally.  No signs of infections or ulcers noted.     Onychomycosis  Pain in right toes  Pain in left toes  Consent was obtained for treatment procedures.   Mechanical debridement of nails 1-5  bilaterally performed with a nail nipper.  Filed with dremel without incident.    Return office visit   3 months                   Told patient to return for periodic foot care and evaluation due to potential at risk complications.   Helane Gunther DPM

## 2024-01-23 ENCOUNTER — Other Ambulatory Visit: Payer: Self-pay | Admitting: Cardiovascular Disease

## 2024-01-23 ENCOUNTER — Other Ambulatory Visit: Payer: Self-pay | Admitting: Internal Medicine

## 2024-02-07 ENCOUNTER — Other Ambulatory Visit: Payer: Self-pay | Admitting: Internal Medicine

## 2024-02-08 ENCOUNTER — Other Ambulatory Visit: Payer: Self-pay

## 2024-02-16 ENCOUNTER — Other Ambulatory Visit: Payer: Self-pay | Admitting: Cardiovascular Disease

## 2024-02-20 ENCOUNTER — Other Ambulatory Visit: Payer: Self-pay | Admitting: Internal Medicine

## 2024-02-20 ENCOUNTER — Ambulatory Visit (INDEPENDENT_AMBULATORY_CARE_PROVIDER_SITE_OTHER): Payer: Medicare Other | Admitting: Internal Medicine

## 2024-02-20 ENCOUNTER — Encounter: Payer: Self-pay | Admitting: Internal Medicine

## 2024-02-20 ENCOUNTER — Ambulatory Visit: Payer: Self-pay | Admitting: Internal Medicine

## 2024-02-20 VITALS — BP 138/74 | HR 66 | Temp 98.2°F | Ht 69.0 in | Wt 171.0 lb

## 2024-02-20 DIAGNOSIS — E1122 Type 2 diabetes mellitus with diabetic chronic kidney disease: Secondary | ICD-10-CM

## 2024-02-20 DIAGNOSIS — R202 Paresthesia of skin: Secondary | ICD-10-CM | POA: Insufficient documentation

## 2024-02-20 DIAGNOSIS — E559 Vitamin D deficiency, unspecified: Secondary | ICD-10-CM | POA: Diagnosis not present

## 2024-02-20 DIAGNOSIS — E039 Hypothyroidism, unspecified: Secondary | ICD-10-CM

## 2024-02-20 DIAGNOSIS — E7800 Pure hypercholesterolemia, unspecified: Secondary | ICD-10-CM | POA: Diagnosis not present

## 2024-02-20 DIAGNOSIS — E538 Deficiency of other specified B group vitamins: Secondary | ICD-10-CM

## 2024-02-20 DIAGNOSIS — N1831 Chronic kidney disease, stage 3a: Secondary | ICD-10-CM

## 2024-02-20 DIAGNOSIS — Z0000 Encounter for general adult medical examination without abnormal findings: Secondary | ICD-10-CM

## 2024-02-20 DIAGNOSIS — Z0001 Encounter for general adult medical examination with abnormal findings: Secondary | ICD-10-CM

## 2024-02-20 DIAGNOSIS — I10 Essential (primary) hypertension: Secondary | ICD-10-CM | POA: Diagnosis not present

## 2024-02-20 LAB — CBC WITH DIFFERENTIAL/PLATELET
Basophils Absolute: 0 10*3/uL (ref 0.0–0.1)
Basophils Relative: 0.5 % (ref 0.0–3.0)
Eosinophils Absolute: 0.1 10*3/uL (ref 0.0–0.7)
Eosinophils Relative: 1.9 % (ref 0.0–5.0)
HCT: 31.3 % — ABNORMAL LOW (ref 39.0–52.0)
Hemoglobin: 10.3 g/dL — ABNORMAL LOW (ref 13.0–17.0)
Lymphocytes Relative: 37.7 % (ref 12.0–46.0)
Lymphs Abs: 1.1 10*3/uL (ref 0.7–4.0)
MCHC: 33 g/dL (ref 30.0–36.0)
MCV: 96.8 fl (ref 78.0–100.0)
Monocytes Absolute: 0.3 10*3/uL (ref 0.1–1.0)
Monocytes Relative: 11.4 % (ref 3.0–12.0)
Neutro Abs: 1.5 10*3/uL (ref 1.4–7.7)
Neutrophils Relative %: 48.5 % (ref 43.0–77.0)
Platelets: 148 10*3/uL — ABNORMAL LOW (ref 150.0–400.0)
RBC: 3.23 Mil/uL — ABNORMAL LOW (ref 4.22–5.81)
RDW: 14.3 % (ref 11.5–15.5)
WBC: 3 10*3/uL — ABNORMAL LOW (ref 4.0–10.5)

## 2024-02-20 LAB — LIPID PANEL
Cholesterol: 137 mg/dL (ref 0–200)
HDL: 58 mg/dL (ref >39.00)
LDL Cholesterol: 68 mg/dL (ref 0–99)
NonHDL: 79.43
Total CHOL/HDL Ratio: 2
Triglycerides: 56 mg/dL (ref 0.0–149.0)
VLDL: 11.2 mg/dL (ref 0.0–40.0)

## 2024-02-20 LAB — URINALYSIS, ROUTINE W REFLEX MICROSCOPIC
Bilirubin Urine: NEGATIVE
Hgb urine dipstick: NEGATIVE
Ketones, ur: NEGATIVE
Leukocytes,Ua: NEGATIVE
Nitrite: NEGATIVE
RBC / HPF: NONE SEEN (ref 0–?)
Specific Gravity, Urine: 1.01 (ref 1.000–1.030)
Total Protein, Urine: NEGATIVE
Urine Glucose: NEGATIVE
Urobilinogen, UA: 0.2 (ref 0.0–1.0)
WBC, UA: NONE SEEN (ref 0–?)
pH: 6 (ref 5.0–8.0)

## 2024-02-20 LAB — HEPATIC FUNCTION PANEL
ALT: 13 U/L (ref 0–53)
AST: 18 U/L (ref 0–37)
Albumin: 4 g/dL (ref 3.5–5.2)
Alkaline Phosphatase: 50 U/L (ref 39–117)
Bilirubin, Direct: 0 mg/dL (ref 0.0–0.3)
Total Bilirubin: 0.3 mg/dL (ref 0.2–1.2)
Total Protein: 6.4 g/dL (ref 6.0–8.3)

## 2024-02-20 LAB — BASIC METABOLIC PANEL WITH GFR
BUN: 21 mg/dL (ref 6–23)
CO2: 26 meq/L (ref 19–32)
Calcium: 8.8 mg/dL (ref 8.4–10.5)
Chloride: 107 meq/L (ref 96–112)
Creatinine, Ser: 1.97 mg/dL — ABNORMAL HIGH (ref 0.40–1.50)
GFR: 29.73 mL/min — ABNORMAL LOW (ref >60.00)
Glucose, Bld: 127 mg/dL — ABNORMAL HIGH (ref 70–99)
Potassium: 4.6 meq/L (ref 3.5–5.1)
Sodium: 139 meq/L (ref 135–145)

## 2024-02-20 LAB — VITAMIN D 25 HYDROXY (VIT D DEFICIENCY, FRACTURES): VITD: 42.35 ng/mL (ref 30.00–100.00)

## 2024-02-20 LAB — MICROALBUMIN / CREATININE URINE RATIO
Creatinine,U: 67.9 mg/dL
Microalb Creat Ratio: 13.8 mg/g (ref 0.0–30.0)
Microalb, Ur: 0.9 mg/dL (ref 0.0–1.9)

## 2024-02-20 LAB — VITAMIN B12: Vitamin B-12: 467 pg/mL (ref 211–911)

## 2024-02-20 LAB — TSH: TSH: 2.84 u[IU]/mL (ref 0.35–5.50)

## 2024-02-20 LAB — HEMOGLOBIN A1C: Hgb A1c MFr Bld: 6.4 % (ref 4.6–6.5)

## 2024-02-20 NOTE — Assessment & Plan Note (Addendum)
 Recent onset mild intermittent, likely neuritic but mild only, no worsening neck or lower back pain, pt declines tx at this time such as gabapentin, now for b12 with labs
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
## 2024-02-20 NOTE — Assessment & Plan Note (Signed)

## 2024-02-20 NOTE — Assessment & Plan Note (Signed)
 Lab Results  Component Value Date   HGBA1C 6.4 08/22/2023   Stable, pt to continue current medical treatment  - diet,wt control

## 2024-02-20 NOTE — Assessment & Plan Note (Signed)
 Last vitamin D Lab Results  Component Value Date   VD25OH 41.59 08/22/2023   Stable, cont oral replacement

## 2024-02-20 NOTE — Assessment & Plan Note (Signed)
 BP Readings from Last 3 Encounters:  02/20/24 138/74  08/22/23 (!) 160/80  06/29/23 118/60   Stable, pt to continue medical treatment norvasc  5 every day, coreg  6.25 bid

## 2024-02-20 NOTE — Patient Instructions (Addendum)

## 2024-02-20 NOTE — Assessment & Plan Note (Signed)
 Lab Results  Component Value Date   LDLCALC 66 08/22/2023   Stable, pt to continue current statin lipitor 40 mg qd

## 2024-02-20 NOTE — Assessment & Plan Note (Signed)
 Lab Results  Component Value Date   TSH 3.08 08/22/2023   Stable, pt to continue levothyroxine  50 mcg every day, and f/u lab today

## 2024-03-15 DIAGNOSIS — N1832 Chronic kidney disease, stage 3b: Secondary | ICD-10-CM | POA: Diagnosis not present

## 2024-03-15 DIAGNOSIS — I129 Hypertensive chronic kidney disease with stage 1 through stage 4 chronic kidney disease, or unspecified chronic kidney disease: Secondary | ICD-10-CM | POA: Diagnosis not present

## 2024-03-26 ENCOUNTER — Ambulatory Visit: Admitting: Podiatry

## 2024-03-26 ENCOUNTER — Encounter: Payer: Self-pay | Admitting: Podiatry

## 2024-03-26 DIAGNOSIS — M79675 Pain in left toe(s): Secondary | ICD-10-CM

## 2024-03-26 DIAGNOSIS — R7309 Other abnormal glucose: Secondary | ICD-10-CM | POA: Diagnosis not present

## 2024-03-26 DIAGNOSIS — M79674 Pain in right toe(s): Secondary | ICD-10-CM

## 2024-03-26 DIAGNOSIS — N184 Chronic kidney disease, stage 4 (severe): Secondary | ICD-10-CM

## 2024-03-26 DIAGNOSIS — B351 Tinea unguium: Secondary | ICD-10-CM

## 2024-03-26 NOTE — Progress Notes (Signed)
 This patient presents  to my office for at risk foot care.  This patient requires this care by a professional since this patient will be at risk due to having diabetes and CKD.  This patient is unable to cut nails himself since the patient cannot reach his nails.These nails are painful walking and wearing shoes.  This patient presents for at risk foot care today.  General Appearance  Alert, conversant and in no acute stress.  Vascular  Dorsalis pedis and posterior tibial  pulses are weakly palpable  bilaterally.  Capillary return is within normal limits  bilaterally. Temperature is within normal limits  bilaterally.  Neurologic  Senn-Weinstein monofilament wire test within normal limits  bilaterally. Muscle power within normal limits bilaterally.  Nails Thick disfigured discolored nails with subungual debris  from hallux to fifth toes bilaterally. No evidence of bacterial infection or drainage bilaterally.  Orthopedic  No limitations of motion  feet .  No crepitus or effusions noted.  No bony pathology or digital deformities noted.  Skin  normotropic skin with no porokeratosis noted bilaterally.  No signs of infections or ulcers noted.     Onychomycosis  Pain in right toes  Pain in left toes  Consent was obtained for treatment procedures.   Mechanical debridement of nails 1-5  bilaterally performed with a nail nipper.  Filed with dremel without incident.    Return office visit   3 months                   Told patient to return for periodic foot care and evaluation due to potential at risk complications.   Helane Gunther DPM

## 2024-04-16 ENCOUNTER — Other Ambulatory Visit: Payer: Self-pay | Admitting: Cardiovascular Disease

## 2024-04-23 ENCOUNTER — Telehealth: Payer: Self-pay

## 2024-04-23 NOTE — Telephone Encounter (Signed)
 Copied from CRM #[id number]. Topic: General - Other >> Apr 23, 2024  9:17 AM Robinson H wrote: Reason for CRM: Patient is calling to see is it time for his Flu and pneumonia vaccine.  Manfred [phone]

## 2024-04-24 NOTE — Telephone Encounter (Unsigned)
 Copied from CRM #[id number]. Topic: General - Call Back - No Documentation >> Apr 24, 2024  2:11 PM Rea C wrote: Reason for CRM: Patient called back and stated that he would go to [facility] to get his flu shot. However, he would like to know if it is also time to get his pneumonia vaccine?  If so, he would like to go to [facility] to get both his flu and pneumonia shots.  Patient would like a call back [phone] (H).

## 2024-04-24 NOTE — Telephone Encounter (Signed)
 Attempted to call patient but had to leave a voice message. Would like to inform patient that our office would not be providing the flu shots until next week but if he would like to get it sooner than that, he could reach out to his pharmacy of choice

## 2024-04-25 NOTE — Telephone Encounter (Signed)
 Yes as the last pneumonia shot was Prevnar 13, and the recommendation now is for Prevnar 20 if more than 5 yrs from last  Ok to contact pt - for Nurse Visit for Prevnar 20

## 2024-04-26 NOTE — Telephone Encounter (Signed)
 Called and informed patient that PCP does recommend to get updated pneumonia vaccine as well

## 2024-05-14 ENCOUNTER — Encounter (INDEPENDENT_AMBULATORY_CARE_PROVIDER_SITE_OTHER): Payer: Medicare Other | Admitting: Ophthalmology

## 2024-05-21 NOTE — Progress Notes (Signed)
 This encounter was created in error - please disregard.

## 2024-05-22 ENCOUNTER — Encounter: Payer: Self-pay | Admitting: Podiatry

## 2024-05-22 ENCOUNTER — Ambulatory Visit: Admitting: Podiatry

## 2024-05-22 DIAGNOSIS — E1142 Type 2 diabetes mellitus with diabetic polyneuropathy: Secondary | ICD-10-CM

## 2024-05-22 NOTE — Progress Notes (Signed)
 He presents today for evaluation of his bilateral diabetic feet.  He states that his blood sugar currently is running 6.4% A1c.  He states that my feet just feel funny in the front and I had a friend whose toes turned black and she lost her toes.  I just do not want that to happen to me he says.  Objective: Vital signs are stable he is alert and oriented x 3 pulses are palpable particularly the DP he has capillary fill time that is immediate.  Neurologic sensorium is mildly diminished per Semmes Weinstein monofilament to just about every other toe on the bilateral foot.  He has good sensation of the forefoot medial longitudinal arch the heel the ankle and the leg.  No open lesions or wounds noted.  Mycotic nails bilateral.  Assessment: Early diabetic peripheral neuropathy.  Early peripheral angiopathy.  Onychomycosis.  Plan: Recommend that he continue to maintain good blood sugars.  I did recommend nervive, and over-the-counter medication.  He will follow-up with Dr. Loreda for routine debridement.

## 2024-05-25 ENCOUNTER — Telehealth: Payer: Self-pay | Admitting: Internal Medicine

## 2024-06-05 ENCOUNTER — Encounter (INDEPENDENT_AMBULATORY_CARE_PROVIDER_SITE_OTHER): Admitting: Ophthalmology

## 2024-06-05 DIAGNOSIS — H35032 Hypertensive retinopathy, left eye: Secondary | ICD-10-CM | POA: Diagnosis not present

## 2024-06-05 DIAGNOSIS — H353121 Nonexudative age-related macular degeneration, left eye, early dry stage: Secondary | ICD-10-CM | POA: Diagnosis not present

## 2024-06-05 DIAGNOSIS — H43812 Vitreous degeneration, left eye: Secondary | ICD-10-CM

## 2024-06-05 DIAGNOSIS — D3132 Benign neoplasm of left choroid: Secondary | ICD-10-CM

## 2024-06-05 DIAGNOSIS — I10 Essential (primary) hypertension: Secondary | ICD-10-CM

## 2024-06-10 ENCOUNTER — Other Ambulatory Visit: Payer: Self-pay | Admitting: Internal Medicine

## 2024-06-15 ENCOUNTER — Other Ambulatory Visit: Payer: Self-pay | Admitting: Internal Medicine

## 2024-06-15 NOTE — Telephone Encounter (Unsigned)
 Copied from CRM [id number]. Topic: Clinical - Medication Refill >> Jun 15, 2024  4:39 PM Alfonso ORN wrote: Medication: glucose blood (ACCU-CHEK GUIDE TEST) test strip  Has the patient contacted their pharmacy? No   This is the patient's preferred pharmacy:  [facility] [phone] - [place], [place] - [address] AT [facility] [address] [place] [address] Phone: [phone] Fax: [phone]   Is this the correct pharmacy for this prescription? Yes  Has the prescription been filled recently? No  Is the patient out of the medication? No  Has the patient been seen for an appointment in the last year OR does the patient have an upcoming appointment? Yes  Can we respond through MyChart? No  Pt says usually is used both night and day and unsure if enough strips to be done twice.

## 2024-06-18 MED ORDER — ACCU-CHEK GUIDE TEST VI STRP: ORAL_STRIP | 12 refills | Status: DC

## 2024-06-19 ENCOUNTER — Telehealth: Payer: Self-pay

## 2024-06-19 NOTE — Telephone Encounter (Signed)
 Copied from CRM [id number]. Topic: Clinical - Medication Question >> Jun 19, 2024  4:40 PM Lauren C wrote: Reason for CRM: Pt says he feels like he doesn't have enough of the glucose blood (ACCU-CHEK GUIDE TEST) test strip and he is wanting to use 2 a day. Is there a way to increase his rx? His callback number is [phone] for any questions.   [facility] [phone] - [place], [place] - [address] AT [facility] [address] [place] [address] Phone: [phone] Fax: [phone]

## 2024-06-20 MED ORDER — ACCU-CHEK GUIDE TEST VI STRP: ORAL_STRIP | 12 refills | Status: AC

## 2024-06-20 NOTE — Telephone Encounter (Signed)
 Ok new increased strip script done erx

## 2024-06-20 NOTE — Addendum Note (Signed)
 Addended by: NORLEEN LYNWOOD ORN on: 06/20/2024 09:52 AM   Modules accepted: Orders

## 2024-06-22 ENCOUNTER — Ambulatory Visit

## 2024-06-22 VITALS — BP 150/80 | HR 69 | Ht 67.0 in | Wt 173.9 lb

## 2024-06-22 DIAGNOSIS — Z0000 Encounter for general adult medical examination without abnormal findings: Secondary | ICD-10-CM | POA: Diagnosis not present
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
## 2024-06-22 NOTE — Patient Instructions (Signed)
 Greg [name],  Thank you for taking the time for your Medicare Wellness Visit. I appreciate your continued commitment to your health goals. Please review the care plan we discussed, and feel free to reach out if I can assist you further.  Medicare recommends these wellness visits once per year to help you and your care team stay ahead of potential health issues. These visits are designed to focus on prevention, allowing your provider to concentrate on managing your acute and chronic conditions during your regular appointments.  Please note that Annual Wellness Visits do not include a physical exam. Some assessments may be limited, especially if the visit was conducted virtually. If needed, we may recommend a separate in-person follow-up with your provider.  Ongoing Care Seeing your primary care provider every 3 to 6 months helps us  monitor your health and provide consistent, personalized care. Next office visit on 08/21/2024.  You are due for a tetanus vaccine and can get that done at your pharmacy.  Keep up the good work.  Referrals If a referral was made during today's visit and you haven't received any updates within two weeks, please contact the referred provider directly to check on the status.  Recommended Screenings:  Health Maintenance  Topic Date Due   DTaP/Tdap/Td vaccine (2 - Td or Tdap) 01/17/2022   Flu Shot  03/30/2024   Eye exam for diabetics  05/08/2024   Hemoglobin A1C  08/21/2024   Complete foot exam   02/19/2025   Medicare Annual Wellness Visit  06/22/2025   Pneumococcal Vaccine for age over 73  Completed   Zoster (Shingles) Vaccine  Completed   Meningitis B Vaccine  Aged Out   COVID-19 Vaccine  Discontinued       06/22/2024    9:45 AM  Advanced Directives  Does Patient Have a Medical Advance Directive? Yes  Type of Estate agent of [place];Living will  Copy of Healthcare Power of Attorney in Chart? No - copy requested   Advance Care Planning  is important because it: Ensures you receive medical care that aligns with your values, goals, and preferences. Provides guidance to your family and loved ones, reducing the emotional burden of decision-making during critical moments.  Vision: Annual vision screenings are recommended for early detection of glaucoma, cataracts, and diabetic retinopathy. These exams can also reveal signs of chronic conditions such as diabetes and high blood pressure.  Dental: Annual dental screenings help detect early signs of oral cancer, gum disease, and other conditions linked to overall health, including heart disease and diabetes.  Please see the attached documents for additional preventive care recommendations.

## 2024-06-26 ENCOUNTER — Ambulatory Visit: Admitting: Podiatry

## 2024-06-26 ENCOUNTER — Encounter: Payer: Self-pay | Admitting: Podiatry

## 2024-06-26 DIAGNOSIS — M79674 Pain in right toe(s): Secondary | ICD-10-CM

## 2024-06-26 DIAGNOSIS — E1142 Type 2 diabetes mellitus with diabetic polyneuropathy: Secondary | ICD-10-CM

## 2024-06-26 DIAGNOSIS — M79675 Pain in left toe(s): Secondary | ICD-10-CM | POA: Diagnosis not present

## 2024-06-26 DIAGNOSIS — N184 Chronic kidney disease, stage 4 (severe): Secondary | ICD-10-CM | POA: Diagnosis not present

## 2024-06-26 DIAGNOSIS — B351 Tinea unguium: Secondary | ICD-10-CM | POA: Diagnosis not present

## 2024-06-26 NOTE — Progress Notes (Signed)
 This patient presents  to my office for at risk foot care.  This patient requires this care by a professional since this patient will be at risk due to having diabetes and CKD.  This patient is unable to cut nails himself since the patient cannot reach his nails.These nails are painful walking and wearing shoes.  This patient presents for at risk foot care today.  General Appearance  Alert, conversant and in no acute stress.  Vascular  Dorsalis pedis and posterior tibial  pulses are weakly palpable  bilaterally.  Capillary return is within normal limits  bilaterally. Temperature is within normal limits  bilaterally.  Neurologic  Senn-Weinstein monofilament wire test within normal limits  bilaterally. Muscle power within normal limits bilaterally.  Nails Thick disfigured discolored nails with subungual debris  from hallux to fifth toes bilaterally. No evidence of bacterial infection or drainage bilaterally.  Orthopedic  No limitations of motion  feet .  No crepitus or effusions noted.  No bony pathology or digital deformities noted.  Skin  normotropic skin with no porokeratosis noted bilaterally.  No signs of infections or ulcers noted.     Onychomycosis  Pain in right toes  Pain in left toes  Consent was obtained for treatment procedures.   Mechanical debridement of nails 1-5  bilaterally performed with a nail nipper.  Filed with dremel without incident.    Return office visit   3 months                   Told patient to return for periodic foot care and evaluation due to potential at risk complications.   Helane Gunther DPM

## 2024-07-25 ENCOUNTER — Other Ambulatory Visit: Payer: Self-pay | Admitting: Cardiovascular Disease

## 2024-08-21 ENCOUNTER — Encounter: Payer: Self-pay | Admitting: Internal Medicine

## 2024-08-21 ENCOUNTER — Ambulatory Visit: Admitting: Internal Medicine

## 2024-08-21 ENCOUNTER — Ambulatory Visit: Payer: Self-pay | Admitting: Internal Medicine

## 2024-08-21 VITALS — BP 128/70 | HR 65 | Temp 98.1°F | Ht 67.0 in | Wt 175.0 lb

## 2024-08-21 DIAGNOSIS — I10 Essential (primary) hypertension: Secondary | ICD-10-CM | POA: Diagnosis not present

## 2024-08-21 DIAGNOSIS — E7800 Pure hypercholesterolemia, unspecified: Secondary | ICD-10-CM | POA: Diagnosis not present

## 2024-08-21 DIAGNOSIS — E559 Vitamin D deficiency, unspecified: Secondary | ICD-10-CM | POA: Diagnosis not present

## 2024-08-21 DIAGNOSIS — K2100 Gastro-esophageal reflux disease with esophagitis, without bleeding: Secondary | ICD-10-CM

## 2024-08-21 DIAGNOSIS — N1831 Chronic kidney disease, stage 3a: Secondary | ICD-10-CM

## 2024-08-21 DIAGNOSIS — E039 Hypothyroidism, unspecified: Secondary | ICD-10-CM | POA: Diagnosis not present

## 2024-08-21 DIAGNOSIS — E1122 Type 2 diabetes mellitus with diabetic chronic kidney disease: Secondary | ICD-10-CM

## 2024-08-21 LAB — HEPATIC FUNCTION PANEL
ALT: 12 U/L (ref 3–53)
AST: 18 U/L (ref 5–37)
Albumin: 4.1 g/dL (ref 3.5–5.2)
Alkaline Phosphatase: 51 U/L (ref 39–117)
Bilirubin, Direct: 0 mg/dL — ABNORMAL LOW (ref 0.1–0.3)
Total Bilirubin: 0.3 mg/dL (ref 0.2–1.2)
Total Protein: 6.8 g/dL (ref 6.0–8.3)

## 2024-08-21 LAB — BASIC METABOLIC PANEL WITH GFR
BUN: 33 mg/dL — ABNORMAL HIGH (ref 6–23)
CO2: 25 meq/L (ref 19–32)
Calcium: 8.9 mg/dL (ref 8.4–10.5)
Chloride: 106 meq/L (ref 96–112)
Creatinine, Ser: 2.07 mg/dL — ABNORMAL HIGH (ref 0.40–1.50)
GFR: 27.92 mL/min — ABNORMAL LOW
Glucose, Bld: 115 mg/dL — ABNORMAL HIGH (ref 70–99)
Potassium: 4.9 meq/L (ref 3.5–5.1)
Sodium: 137 meq/L (ref 135–145)

## 2024-08-21 LAB — LIPID PANEL
Cholesterol: 132 mg/dL (ref 28–200)
HDL: 59 mg/dL
LDL Cholesterol: 48 mg/dL (ref 10–99)
NonHDL: 73.19
Total CHOL/HDL Ratio: 2
Triglycerides: 126 mg/dL (ref 10.0–149.0)
VLDL: 25.2 mg/dL (ref 0.0–40.0)

## 2024-08-21 LAB — VITAMIN D 25 HYDROXY (VIT D DEFICIENCY, FRACTURES): VITD: 47.52 ng/mL (ref 30.00–100.00)

## 2024-08-21 LAB — HEMOGLOBIN A1C: Hgb A1c MFr Bld: 6.3 % (ref 4.6–6.5)

## 2024-08-21 MED ORDER — HYDROCHLOROTHIAZIDE 12.5 MG PO CAPS: 12.5000 mg | ORAL_CAPSULE | Freq: Every day | ORAL | 3 refills | Status: AC

## 2024-08-21 MED ORDER — ACCU-CHEK FASTCLIX LANCETS MISC: 3 refills | Status: AC

## 2024-08-21 NOTE — Assessment & Plan Note (Addendum)
 With ckd3a  Lab Results  Component Value Date   HGBA1C 6.4 02/20/2024   Stable, pt to continue current medical treatment  - diet, wt control and f/u lab today

## 2024-08-21 NOTE — Assessment & Plan Note (Signed)
 Lab Results  Component Value Date   TSH 2.84 02/20/2024   Stable, pt to continue levothyroxine  50 mcg qd

## 2024-08-21 NOTE — Assessment & Plan Note (Signed)
 Stable pt declines need for further tx at this time

## 2024-08-21 NOTE — Patient Instructions (Addendum)
 Please take all new medication as prescribed - the HCT 12.5 mg per day for blood pressure (sent to [facility])  Please continue all other medications as before,  Please have the pharmacy call with any other refills you may need.  Please continue your efforts at being more active, low cholesterol diet, and weight control.  Please keep your appointments with your specialists as you may have planned  Please go to the LAB at the blood drawing area for the tests to be done  You will be contacted by phone if any changes need to be made immediately.  Otherwise, you will receive a letter about your results with an explanation, but please check with MyChart first.  Please make an Appointment to return in 6 months, or sooner if needed

## 2024-08-21 NOTE — Assessment & Plan Note (Signed)
 BP Readings from Last 3 Encounters:  08/21/24 128/70  06/22/24 (!) 150/80  02/20/24 138/74   Pt has documented today over 1 wk of persistent elev SBP to 150s;  pt to add Hct 12.5 mg every day, and continue norvasc  5 every day, coreg  6.25 bid

## 2024-08-21 NOTE — Assessment & Plan Note (Signed)
 Last vitamin D  Lab Results  Component Value Date   VD25OH 42.35 02/20/2024   Stable, cont oral replacement

## 2024-08-21 NOTE — Progress Notes (Signed)
 The test results show that your current treatment is OK, as the tests are stable.  Please continue the same plan.  There is no other need for change of treatment or further evaluation based on these results, at this time.  thanks

## 2024-08-21 NOTE — Assessment & Plan Note (Signed)
 Lab Results  Component Value Date   LDLCALC 68 02/20/2024   Stable, pt to continue current statin lipitor 40 mg qd
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
## 2024-09-25 ENCOUNTER — Encounter: Payer: Self-pay | Admitting: Podiatry

## 2024-09-25 ENCOUNTER — Ambulatory Visit: Admitting: Podiatry

## 2024-09-25 ENCOUNTER — Telehealth: Payer: Self-pay

## 2024-09-25 DIAGNOSIS — M79674 Pain in right toe(s): Secondary | ICD-10-CM

## 2024-09-25 DIAGNOSIS — E1142 Type 2 diabetes mellitus with diabetic polyneuropathy: Secondary | ICD-10-CM | POA: Diagnosis not present

## 2024-09-25 DIAGNOSIS — N184 Chronic kidney disease, stage 4 (severe): Secondary | ICD-10-CM | POA: Diagnosis not present

## 2024-09-25 DIAGNOSIS — B351 Tinea unguium: Secondary | ICD-10-CM

## 2024-09-25 DIAGNOSIS — M79675 Pain in left toe(s): Secondary | ICD-10-CM

## 2024-09-25 NOTE — Telephone Encounter (Signed)
 Patient left message on nurse triage line stating that he received a phone call,but by the time he reached the phone it hung up. He wants to know if he is still supposed to be here at 2:15 today. Please give patient a call.

## 2024-09-25 NOTE — Progress Notes (Signed)
 This patient presents  to my office for at risk foot care.  This patient requires this care by a professional since this patient will be at risk due to having diabetes and CKD.  This patient is unable to cut nails himself since the patient cannot reach his nails.These nails are painful walking and wearing shoes.  This patient presents for at risk foot care today.  General Appearance  Alert, conversant and in no acute stress.  Vascular  Dorsalis pedis and posterior tibial  pulses are weakly palpable  bilaterally.  Capillary return is within normal limits  bilaterally. Temperature is within normal limits  bilaterally.  Neurologic  Senn-Weinstein monofilament wire test within normal limits  bilaterally. Muscle power within normal limits bilaterally.  Nails Thick disfigured discolored nails with subungual debris  from hallux to fifth toes bilaterally. No evidence of bacterial infection or drainage bilaterally.  Orthopedic  No limitations of motion  feet .  No crepitus or effusions noted.  No bony pathology or digital deformities noted.  Skin  normotropic skin with no porokeratosis noted bilaterally.  No signs of infections or ulcers noted.     Onychomycosis  Pain in right toes  Pain in left toes  Consent was obtained for treatment procedures.   Mechanical debridement of nails 1-5  bilaterally performed with a nail nipper.  Filed with dremel without incident.    Return office visit   3 months                   Told patient to return for periodic foot care and evaluation due to potential at risk complications.   Helane Gunther DPM

## 2024-12-24 ENCOUNTER — Ambulatory Visit: Admitting: Podiatry

## 2025-01-01 ENCOUNTER — Encounter: Admitting: Family Medicine

## 2025-06-05 ENCOUNTER — Encounter (INDEPENDENT_AMBULATORY_CARE_PROVIDER_SITE_OTHER): Admitting: Ophthalmology
